# Patient Record
Sex: Female | Born: 1960 | Race: White | Hispanic: No | Marital: Married | Smoking: Never smoker
Health system: Southern US, Community
[De-identification: ages and names within clinical notes are randomized; demographics above are authoritative.]

## PROBLEM LIST (undated history)

## (undated) ENCOUNTER — Emergency Department (HOSPITAL_COMMUNITY): Admission: EM | Payer: Self-pay | Source: Home / Self Care

## (undated) DIAGNOSIS — K8689 Other specified diseases of pancreas: Secondary | ICD-10-CM

## (undated) DIAGNOSIS — I341 Nonrheumatic mitral (valve) prolapse: Secondary | ICD-10-CM

## (undated) DIAGNOSIS — Z8489 Family history of other specified conditions: Secondary | ICD-10-CM

## (undated) DIAGNOSIS — R51 Headache: Secondary | ICD-10-CM

## (undated) DIAGNOSIS — R519 Headache, unspecified: Secondary | ICD-10-CM

## (undated) DIAGNOSIS — E7800 Pure hypercholesterolemia, unspecified: Secondary | ICD-10-CM

## (undated) DIAGNOSIS — M3500 Sicca syndrome, unspecified: Secondary | ICD-10-CM

---

## 1998-09-17 ENCOUNTER — Other Ambulatory Visit: Admission: RE | Admit: 1998-09-17 | Discharge: 1998-09-17 | Payer: Self-pay | Admitting: *Deleted

## 2001-08-10 ENCOUNTER — Other Ambulatory Visit: Admission: RE | Admit: 2001-08-10 | Discharge: 2001-08-10 | Payer: Self-pay | Admitting: *Deleted

## 2002-07-25 ENCOUNTER — Encounter: Payer: Self-pay | Admitting: Internal Medicine

## 2002-07-25 ENCOUNTER — Ambulatory Visit (HOSPITAL_COMMUNITY): Admission: RE | Admit: 2002-07-25 | Discharge: 2002-07-25 | Payer: Self-pay | Admitting: Internal Medicine

## 2002-09-10 ENCOUNTER — Other Ambulatory Visit: Admission: RE | Admit: 2002-09-10 | Discharge: 2002-09-10 | Payer: Self-pay | Admitting: *Deleted

## 2003-04-05 ENCOUNTER — Encounter: Payer: Self-pay | Admitting: *Deleted

## 2003-04-05 ENCOUNTER — Ambulatory Visit (HOSPITAL_COMMUNITY): Admission: RE | Admit: 2003-04-05 | Discharge: 2003-04-05 | Payer: Self-pay | Admitting: *Deleted

## 2003-09-11 ENCOUNTER — Other Ambulatory Visit: Admission: RE | Admit: 2003-09-11 | Discharge: 2003-09-11 | Payer: Self-pay | Admitting: Obstetrics and Gynecology

## 2004-09-23 ENCOUNTER — Other Ambulatory Visit: Admission: RE | Admit: 2004-09-23 | Discharge: 2004-09-23 | Payer: Self-pay | Admitting: *Deleted

## 2006-01-24 ENCOUNTER — Other Ambulatory Visit: Admission: RE | Admit: 2006-01-24 | Discharge: 2006-01-24 | Payer: Self-pay | Admitting: Obstetrics & Gynecology

## 2006-08-26 ENCOUNTER — Other Ambulatory Visit: Admission: RE | Admit: 2006-08-26 | Discharge: 2006-08-26 | Payer: Self-pay | Admitting: Obstetrics & Gynecology

## 2007-01-26 ENCOUNTER — Other Ambulatory Visit: Admission: RE | Admit: 2007-01-26 | Discharge: 2007-01-26 | Payer: Self-pay | Admitting: Obstetrics & Gynecology

## 2008-02-19 ENCOUNTER — Other Ambulatory Visit: Admission: RE | Admit: 2008-02-19 | Discharge: 2008-02-19 | Payer: Self-pay | Admitting: Obstetrics & Gynecology

## 2011-06-22 HISTORY — PX: AUGMENTATION MAMMAPLASTY: SUR837

## 2012-11-21 ENCOUNTER — Ambulatory Visit (INDEPENDENT_AMBULATORY_CARE_PROVIDER_SITE_OTHER): Payer: Commercial Managed Care - PPO | Admitting: Psychology

## 2012-11-21 DIAGNOSIS — F4320 Adjustment disorder, unspecified: Secondary | ICD-10-CM

## 2013-03-16 ENCOUNTER — Encounter: Payer: Self-pay | Admitting: Obstetrics & Gynecology

## 2013-08-10 ENCOUNTER — Ambulatory Visit: Payer: Self-pay | Admitting: Obstetrics & Gynecology

## 2014-04-08 ENCOUNTER — Ambulatory Visit (INDEPENDENT_AMBULATORY_CARE_PROVIDER_SITE_OTHER): Payer: Commercial Managed Care - PPO | Admitting: Psychology

## 2014-04-08 DIAGNOSIS — F348 Other persistent mood [affective] disorders: Secondary | ICD-10-CM

## 2015-11-21 ENCOUNTER — Ambulatory Visit (INDEPENDENT_AMBULATORY_CARE_PROVIDER_SITE_OTHER): Payer: Commercial Managed Care - PPO | Admitting: Psychology

## 2015-11-21 DIAGNOSIS — F3481 Disruptive mood dysregulation disorder: Secondary | ICD-10-CM | POA: Diagnosis not present

## 2015-12-16 ENCOUNTER — Ambulatory Visit (INDEPENDENT_AMBULATORY_CARE_PROVIDER_SITE_OTHER): Payer: Commercial Managed Care - PPO | Admitting: Psychology

## 2015-12-16 DIAGNOSIS — F3481 Disruptive mood dysregulation disorder: Secondary | ICD-10-CM | POA: Diagnosis not present

## 2016-01-19 ENCOUNTER — Ambulatory Visit (INDEPENDENT_AMBULATORY_CARE_PROVIDER_SITE_OTHER): Payer: Commercial Managed Care - PPO | Admitting: Psychology

## 2016-01-19 DIAGNOSIS — F3481 Disruptive mood dysregulation disorder: Secondary | ICD-10-CM

## 2016-12-20 DIAGNOSIS — H93293 Other abnormal auditory perceptions, bilateral: Secondary | ICD-10-CM | POA: Diagnosis not present

## 2016-12-20 DIAGNOSIS — Z7289 Other problems related to lifestyle: Secondary | ICD-10-CM | POA: Diagnosis not present

## 2016-12-20 DIAGNOSIS — H90A22 Sensorineural hearing loss, unilateral, left ear, with restricted hearing on the contralateral side: Secondary | ICD-10-CM | POA: Diagnosis not present

## 2016-12-20 DIAGNOSIS — H6981 Other specified disorders of Eustachian tube, right ear: Secondary | ICD-10-CM | POA: Diagnosis not present

## 2016-12-20 DIAGNOSIS — H90A31 Mixed conductive and sensorineural hearing loss, unilateral, right ear with restricted hearing on the contralateral side: Secondary | ICD-10-CM | POA: Diagnosis not present

## 2016-12-30 ENCOUNTER — Ambulatory Visit (INDEPENDENT_AMBULATORY_CARE_PROVIDER_SITE_OTHER): Payer: Commercial Managed Care - PPO | Admitting: Psychology

## 2016-12-30 DIAGNOSIS — F3481 Disruptive mood dysregulation disorder: Secondary | ICD-10-CM | POA: Diagnosis not present

## 2017-01-03 ENCOUNTER — Ambulatory Visit: Payer: Commercial Managed Care - PPO | Admitting: Psychology

## 2017-01-06 DIAGNOSIS — H04123 Dry eye syndrome of bilateral lacrimal glands: Secondary | ICD-10-CM | POA: Diagnosis not present

## 2017-01-06 DIAGNOSIS — H5213 Myopia, bilateral: Secondary | ICD-10-CM | POA: Diagnosis not present

## 2017-01-20 DIAGNOSIS — Z0110 Encounter for examination of ears and hearing without abnormal findings: Secondary | ICD-10-CM | POA: Diagnosis not present

## 2017-01-20 DIAGNOSIS — H6981 Other specified disorders of Eustachian tube, right ear: Secondary | ICD-10-CM | POA: Diagnosis not present

## 2017-06-10 ENCOUNTER — Emergency Department (HOSPITAL_COMMUNITY)
Admission: EM | Admit: 2017-06-10 | Discharge: 2017-06-10 | Disposition: A | Discharge status: Home / Self Care | Payer: Commercial Managed Care - PPO | Attending: Emergency Medicine | Admitting: Emergency Medicine

## 2017-06-10 ENCOUNTER — Other Ambulatory Visit: Payer: Self-pay

## 2017-06-10 ENCOUNTER — Encounter (HOSPITAL_COMMUNITY): Payer: Self-pay

## 2017-06-10 ENCOUNTER — Emergency Department (HOSPITAL_COMMUNITY): Payer: Commercial Managed Care - PPO

## 2017-06-10 DIAGNOSIS — R072 Precordial pain: Secondary | ICD-10-CM | POA: Diagnosis not present

## 2017-06-10 DIAGNOSIS — R0789 Other chest pain: Secondary | ICD-10-CM | POA: Diagnosis not present

## 2017-06-10 DIAGNOSIS — R079 Chest pain, unspecified: Secondary | ICD-10-CM | POA: Diagnosis not present

## 2017-06-10 DIAGNOSIS — R9431 Abnormal electrocardiogram [ECG] [EKG]: Secondary | ICD-10-CM | POA: Diagnosis not present

## 2017-06-10 DIAGNOSIS — R061 Stridor: Secondary | ICD-10-CM | POA: Diagnosis not present

## 2017-06-10 LAB — CBC
HEMATOCRIT: 39.8 % (ref 36.0–46.0)
Hemoglobin: 13.5 g/dL (ref 12.0–15.0)
MCH: 32.5 pg (ref 26.0–34.0)
MCHC: 33.9 g/dL (ref 30.0–36.0)
MCV: 95.9 fL (ref 78.0–100.0)
PLATELETS: 262 10*3/uL (ref 150–400)
RBC: 4.15 MIL/uL (ref 3.87–5.11)
RDW: 11.8 % (ref 11.5–15.5)
WBC: 4.9 10*3/uL (ref 4.0–10.5)

## 2017-06-10 LAB — I-STAT TROPONIN, ED
TROPONIN I, POC: 0 ng/mL (ref 0.00–0.08)
TROPONIN I, POC: 0.01 ng/mL (ref 0.00–0.08)

## 2017-06-10 LAB — BASIC METABOLIC PANEL
Anion gap: 8 (ref 5–15)
BUN: 13 mg/dL (ref 6–20)
CALCIUM: 9.1 mg/dL (ref 8.9–10.3)
CO2: 29 mmol/L (ref 22–32)
Chloride: 102 mmol/L (ref 101–111)
Creatinine, Ser: 0.79 mg/dL (ref 0.44–1.00)
GFR calc Af Amer: >60 mL/min (ref >60)
GLUCOSE: 101 mg/dL — AB (ref 65–99)
POTASSIUM: 3.8 mmol/L (ref 3.5–5.1)
SODIUM: 139 mmol/L (ref 135–145)

## 2017-06-10 NOTE — Consult Note (Signed)
Cardiology Moonlighter Note  Called by Dr. Ashok Cordia in ED to evaluate Alyssa Harper, who is a 56 y.o. female presenting with chest discomfort. Symptoms started yesterday when driving home. Bilateral chest pressure without radiation or other associated symptoms. Remained constant for 2 to 3 hours then resolved spontaneously. Symptoms again started shortly after waking this morning. Again lasted a few hours and resolved spontaneously. Not associated with exertion. No radiation. Thinks that laying supine made it better. Taking deep breaths made symptoms worse. Symptoms gone by the time she arrived in ED and did not recur during ED course.   Patient is a never smoker. Drinks 1 to 2 glasses of wine several days per week. Works as an Forensic psychologist. Has been under an extraordinary amount of stress this year due to work issues. Has "high" HDL and "low" LDL per her report. Has psoriasis, no other medical issues. Both grandfathers had "heart troubles." One grandfather died from MI in his 78's. Other died in his 20's from pancreatic cancer. No history of early cardiac disease or stroke.   Physical exam on Alyssa Harper completely normal. Chest discomfort near left breast reproducible with palpation in intercostal space. ECG w/ diffuse, sub-mm, upsloping ST elevations and diffuse PR depressions. Nonspecific T wave flattening. Normal R wave progression. No Q waves. Troponin negative x 2, drawn 3 hours apart.   In summary, patient presenting with atypical chest pain symptoms and nonspecific ECG changes. I discussed a differential diagnosis with the patient, which included pericarditis, costochondritis, GERD. ECG is more suspicious for pericarditis than for coronary disease. Heart score in ED 2, corresponding to low risk for MACE. Patient prefers to go home and continue workup as outpatient. Agreed that this plan is reasonable. Patient will call to schedule outpatient appointment with cardiologist. Recommended that she have an exercise  stress test at some point in the near future, which she will discuss at her cardiologist appointment. All questions answered to patient and husband's satisfaction.  Alyssa Mowers, MD Cardiology Fellow, PGY-5

## 2017-06-10 NOTE — ED Notes (Signed)
Cards at bedside

## 2017-06-10 NOTE — ED Triage Notes (Signed)
[facility]- pt coming from PCP with complaint of chest tightness since 6:30 last night, stopped last night at 10:30. Was strongest at 10am. No cardiac hx, EKG WNL. 324mg  of aspirin, and 20g IV in LAC.  118/76, HR 60, SPO2 100%.

## 2017-06-10 NOTE — Discharge Instructions (Signed)
It was our pleasure to provide your ER care today - we hope that you feel better.  Take an enteric coated aspirin a day.   Follow up with cardiologist in the coming week - discuss possible stress testing.  Call their office Wednesday AM to arrange appointment.   Return to ER right away if worse, persistent or recurrent chest pain, trouble breathing, weak/fainting, other concern.

## 2017-06-10 NOTE — ED Provider Notes (Addendum)
[facility] DEPARTMENT Provider Note   CSN: [id number] Arrival date & time: 06/10/17  1703     History   Chief Complaint Chief Complaint  Patient presents with  . Chest Pain    HPI Alyssa Harper is a 56 y.o. female.  Patient c/o chest pain intermittently since last night. Occurred at rest, initially driving home last night.  After several minutes improved. Then recurred today, again at rest. No relation to exertion or activity. Was mild to moderate, achy, squeezy, with mild sob. No nv. No diaphoresis. No pleuritic pain. No cough or uri symptoms. No heartburn. No gerd hx. Mat and pat grandparents w hx cad. No recent exertional cp or unusual doe. No fevers. No leg pain or swelling.  No hx dvt or pe.  Non smoker.    The history is provided by the patient.  Chest Pain   Pertinent negatives include no abdominal pain, no fever, no headaches, no shortness of breath and no vomiting.    History reviewed. No pertinent past medical history.  There are no active problems to display for this patient.   History reviewed. No pertinent surgical history.  OB History    No data available       Home Medications    Prior to Admission medications   Not on File    Family History History reviewed. No pertinent family history.  Social History Social History   Tobacco Use  . Smoking status: Never Smoker  . Smokeless tobacco: Never Used  Substance Use Topics  . Alcohol use: Yes    Comment: glass of wine per night  . Drug use: Not on file     Allergies   Patient has no known allergies.   Review of Systems Review of Systems  Constitutional: Negative for fever.  HENT: Negative for sore throat.   Eyes: Negative for redness.  Respiratory: Negative for shortness of breath.   Cardiovascular: Positive for chest pain. Negative for leg swelling.  Gastrointestinal: Negative for abdominal pain, diarrhea and vomiting.  Genitourinary: Negative for flank  pain.  Musculoskeletal: Negative for neck pain.  Skin: Negative for rash.  Neurological: Negative for headaches.  Hematological: Does not bruise/bleed easily.  Psychiatric/Behavioral: Negative for confusion.     Physical Exam Updated Vital Signs BP 114/69 (BP Location: Right Arm)   Pulse (!) 53   Temp 98.8 F (37.1 C)   Resp 15   Ht 1.651 m (5\' 5" )   Wt 54.4 kg (120 lb)   SpO2 97%   BMI 19.97 kg/m   Physical Exam  Constitutional: She appears well-developed and well-nourished. No distress.  HENT:  Mouth/Throat: Oropharynx is clear and moist.  Eyes: Conjunctivae are normal. No scleral icterus.  Neck: Neck supple. No tracheal deviation present.  Cardiovascular: Normal rate, regular rhythm, normal heart sounds and intact distal pulses. Exam reveals no gallop and no friction rub.  No murmur heard. Pulmonary/Chest: Effort normal and breath sounds normal. No respiratory distress.  Abdominal: Soft. Normal appearance and bowel sounds are normal. She exhibits no distension. There is no tenderness. There is no guarding.  Musculoskeletal: She exhibits no edema or tenderness.  Neurological: She is alert.  Skin: Skin is warm and dry. No rash noted. She is not diaphoretic.  Psychiatric: She has a normal mood and affect.  Nursing note and vitals reviewed.    ED Treatments / Results  Labs (all labs ordered are listed, but only abnormal results are displayed) Results for orders placed  or performed during the hospital encounter of 63/87/56  Basic metabolic panel  Result Value Ref Range   Sodium 139 135 - 145 mmol/L   Potassium 3.8 3.5 - 5.1 mmol/L   Chloride 102 101 - 111 mmol/L   CO2 29 22 - 32 mmol/L   Glucose, Bld 101 (H) 65 - 99 mg/dL   BUN 13 6 - 20 mg/dL   Creatinine, Ser 0.79 0.44 - 1.00 mg/dL   Calcium 9.1 8.9 - 10.3 mg/dL   GFR calc non Af Amer >60 >60 mL/min   GFR calc Af Amer >60 >60 mL/min   Anion gap 8 5 - 15  CBC  Result Value Ref Range   WBC 4.9 4.0 - 10.5 K/uL     RBC 4.15 3.87 - 5.11 MIL/uL   Hemoglobin 13.5 12.0 - 15.0 g/dL   HCT 39.8 36.0 - 46.0 %   MCV 95.9 78.0 - 100.0 fL   MCH 32.5 26.0 - 34.0 pg   MCHC 33.9 30.0 - 36.0 g/dL   RDW 11.8 11.5 - 15.5 %   Platelets 262 150 - 400 K/uL  I-stat troponin, ED  Result Value Ref Range   Troponin i, poc 0.00 0.00 - 0.08 ng/mL   Comment 3          I-Stat Troponin, ED (not at [facility])  Result Value Ref Range   Troponin i, poc 0.01 0.00 - 0.08 ng/mL   Comment 3           Dg Chest 2 View  Result Date: 06/10/2017 CLINICAL DATA:  Chest tightness since last night. EXAM: CHEST  2 VIEW COMPARISON:  None. FINDINGS: The heart size and mediastinal contours are within normal limits. Both lungs are clear. The visualized skeletal structures are unremarkable. IMPRESSION: No active cardiopulmonary disease. Electronically Signed   By: Marin Olp M.D.   On: 06/10/2017 18:06    EKG  EKG Interpretation None       Radiology Dg Chest 2 View  Result Date: 06/10/2017 CLINICAL DATA:  Chest tightness since last night. EXAM: CHEST  2 VIEW COMPARISON:  None. FINDINGS: The heart size and mediastinal contours are within normal limits. Both lungs are clear. The visualized skeletal structures are unremarkable. IMPRESSION: No active cardiopulmonary disease. Electronically Signed   By: Marin Olp M.D.   On: 06/10/2017 18:06    Procedures Procedures (including critical care time)  Medications Ordered in ED Medications - No data to display   Initial Impression / Assessment and Plan / ED Course  I have reviewed the triage vital signs and the nursing notes.  Pertinent labs & imaging results that were available during my care of the patient were reviewed by me and considered in my medical decision making (see chart for details).  Iv ns. Ecg. Labs.   Reviewed nursing notes and prior charts for additional history.   Initial trop negative/normal.  Discussed plan of delta trop w pt.  Recheck, patient is symptom  free, no chest discomfort, no sob.   Repeat trop is normal.  Patient remains chest pain/discomfort free, no sob.   pts has some t inv, no old ecg to compare.  Remains symptom free.  I discussed with cardiologist on call, who reviewed pts ecg, and came to evaluate her in ED - he indicates feels that patient is appropriate for d/c to home with outpatient card f/u/outpt stress testing.   Given family hx cad (in older age), recent atyp chest pain, ecg w no old for  comparison - rec close outpt cardiology f/u, stress testing.   rec asa q day. Discussed w pt, return precautions, including go to ER if any recurrent or persistent chest pain, sob, etc.  Patient currently symptom free and appears stable for d/c.     Final Clinical Impressions(s) / ED Diagnoses   Final diagnoses:  None    ED Discharge Orders    None           Lajean Saver, MD 06/10/17 2201

## 2017-06-11 DIAGNOSIS — R0789 Other chest pain: Secondary | ICD-10-CM | POA: Diagnosis not present

## 2017-07-29 DIAGNOSIS — Y9301 Activity, walking, marching and hiking: Secondary | ICD-10-CM | POA: Diagnosis not present

## 2017-07-29 DIAGNOSIS — X58XXXA Exposure to other specified factors, initial encounter: Secondary | ICD-10-CM | POA: Diagnosis not present

## 2017-07-29 DIAGNOSIS — S52134A Nondisplaced fracture of neck of right radius, initial encounter for closed fracture: Secondary | ICD-10-CM | POA: Diagnosis not present

## 2017-07-29 DIAGNOSIS — W010XXA Fall on same level from slipping, tripping and stumbling without subsequent striking against object, initial encounter: Secondary | ICD-10-CM | POA: Diagnosis not present

## 2017-07-29 DIAGNOSIS — S52131A Displaced fracture of neck of right radius, initial encounter for closed fracture: Secondary | ICD-10-CM | POA: Diagnosis not present

## 2017-08-01 DIAGNOSIS — M25531 Pain in right wrist: Secondary | ICD-10-CM | POA: Diagnosis not present

## 2017-08-01 DIAGNOSIS — M25521 Pain in right elbow: Secondary | ICD-10-CM | POA: Diagnosis not present

## 2017-08-15 DIAGNOSIS — M25521 Pain in right elbow: Secondary | ICD-10-CM | POA: Diagnosis not present

## 2017-08-17 DIAGNOSIS — N951 Menopausal and female climacteric states: Secondary | ICD-10-CM | POA: Diagnosis not present

## 2017-08-17 DIAGNOSIS — Z131 Encounter for screening for diabetes mellitus: Secondary | ICD-10-CM | POA: Diagnosis not present

## 2017-08-17 DIAGNOSIS — R682 Dry mouth, unspecified: Secondary | ICD-10-CM | POA: Diagnosis not present

## 2017-08-17 DIAGNOSIS — M859 Disorder of bone density and structure, unspecified: Secondary | ICD-10-CM | POA: Diagnosis not present

## 2017-08-17 DIAGNOSIS — Z1329 Encounter for screening for other suspected endocrine disorder: Secondary | ICD-10-CM | POA: Diagnosis not present

## 2017-08-17 DIAGNOSIS — Z0000 Encounter for general adult medical examination without abnormal findings: Secondary | ICD-10-CM | POA: Diagnosis not present

## 2017-08-17 DIAGNOSIS — Z130 Encounter for screening for diseases of the blood and blood-forming organs and certain disorders involving the immune mechanism: Secondary | ICD-10-CM | POA: Diagnosis not present

## 2017-08-17 DIAGNOSIS — Z01419 Encounter for gynecological examination (general) (routine) without abnormal findings: Secondary | ICD-10-CM | POA: Diagnosis not present

## 2017-08-17 DIAGNOSIS — Z13220 Encounter for screening for lipoid disorders: Secondary | ICD-10-CM | POA: Diagnosis not present

## 2017-09-01 DIAGNOSIS — M25531 Pain in right wrist: Secondary | ICD-10-CM | POA: Diagnosis not present

## 2017-09-01 DIAGNOSIS — M25521 Pain in right elbow: Secondary | ICD-10-CM | POA: Diagnosis not present

## 2017-09-20 DIAGNOSIS — S63501D Unspecified sprain of right wrist, subsequent encounter: Secondary | ICD-10-CM | POA: Diagnosis not present

## 2017-09-20 DIAGNOSIS — S52134D Nondisplaced fracture of neck of right radius, subsequent encounter for closed fracture with routine healing: Secondary | ICD-10-CM | POA: Diagnosis not present

## 2017-10-04 DIAGNOSIS — S52134D Nondisplaced fracture of neck of right radius, subsequent encounter for closed fracture with routine healing: Secondary | ICD-10-CM | POA: Diagnosis not present

## 2017-10-04 DIAGNOSIS — S63501D Unspecified sprain of right wrist, subsequent encounter: Secondary | ICD-10-CM | POA: Diagnosis not present

## 2017-12-21 DIAGNOSIS — R21 Rash and other nonspecific skin eruption: Secondary | ICD-10-CM | POA: Diagnosis not present

## 2017-12-21 DIAGNOSIS — R682 Dry mouth, unspecified: Secondary | ICD-10-CM | POA: Diagnosis not present

## 2017-12-21 DIAGNOSIS — Z9109 Other allergy status, other than to drugs and biological substances: Secondary | ICD-10-CM | POA: Diagnosis not present

## 2018-01-12 DIAGNOSIS — H5213 Myopia, bilateral: Secondary | ICD-10-CM | POA: Diagnosis not present

## 2018-01-12 DIAGNOSIS — H04123 Dry eye syndrome of bilateral lacrimal glands: Secondary | ICD-10-CM | POA: Diagnosis not present

## 2018-01-12 DIAGNOSIS — H524 Presbyopia: Secondary | ICD-10-CM | POA: Diagnosis not present

## 2018-02-14 DIAGNOSIS — H6981 Other specified disorders of Eustachian tube, right ear: Secondary | ICD-10-CM | POA: Diagnosis not present

## 2018-03-15 DIAGNOSIS — M3500 Sicca syndrome, unspecified: Secondary | ICD-10-CM | POA: Diagnosis not present

## 2018-03-15 DIAGNOSIS — R5383 Other fatigue: Secondary | ICD-10-CM | POA: Diagnosis not present

## 2018-03-15 DIAGNOSIS — L409 Psoriasis, unspecified: Secondary | ICD-10-CM | POA: Diagnosis not present

## 2018-06-26 ENCOUNTER — Ambulatory Visit: Payer: Self-pay | Admitting: Sports Medicine

## 2018-06-27 ENCOUNTER — Other Ambulatory Visit: Payer: Self-pay | Admitting: Sports Medicine

## 2018-06-27 ENCOUNTER — Encounter: Payer: Self-pay | Admitting: Sports Medicine

## 2018-06-27 ENCOUNTER — Ambulatory Visit
Admission: RE | Admit: 2018-06-27 | Discharge: 2018-06-27 | Disposition: A | Discharge status: Home / Self Care | Payer: 59 | Source: Ambulatory Visit | Attending: Sports Medicine | Admitting: Sports Medicine

## 2018-06-27 ENCOUNTER — Ambulatory Visit (INDEPENDENT_AMBULATORY_CARE_PROVIDER_SITE_OTHER): Payer: 59 | Admitting: Sports Medicine

## 2018-06-27 ENCOUNTER — Ambulatory Visit: Payer: Self-pay | Admitting: Family Medicine

## 2018-06-27 VITALS — BP 109/63 | Ht 65.0 in | Wt 119.0 lb

## 2018-06-27 DIAGNOSIS — M542 Cervicalgia: Secondary | ICD-10-CM

## 2018-06-27 DIAGNOSIS — M85859 Other specified disorders of bone density and structure, unspecified thigh: Secondary | ICD-10-CM

## 2018-06-27 DIAGNOSIS — M8580 Other specified disorders of bone density and structure, unspecified site: Secondary | ICD-10-CM | POA: Insufficient documentation

## 2018-06-27 NOTE — Progress Notes (Signed)
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

## 2018-06-27 NOTE — Assessment & Plan Note (Signed)
Isometric exercise protocol Posture change Trapezius exercises  Check XR for DDD  Try conservative care

## 2018-06-27 NOTE — Assessment & Plan Note (Signed)
Needs to use Ca and Vit D  Preventive hop exercise

## 2018-06-27 NOTE — Patient Instructions (Signed)
For osteopenia  Do my 30 sec hop x 3 daily as a prevention  Neck isometric exercises - 4 directions / count of 10/ 5 repeats  Shake outs - swinging your arms when you get trapezius spasm  Good posture on computer  If you get a flare try taking a gabapentin to see if it blocks the symtpoms  I'll call you with XR result

## 2018-07-03 ENCOUNTER — Encounter (HOSPITAL_COMMUNITY): Payer: Self-pay

## 2018-07-03 ENCOUNTER — Emergency Department (HOSPITAL_COMMUNITY): Payer: 59

## 2018-07-03 ENCOUNTER — Inpatient Hospital Stay (HOSPITAL_COMMUNITY): Payer: 59

## 2018-07-03 ENCOUNTER — Other Ambulatory Visit: Payer: Self-pay

## 2018-07-03 ENCOUNTER — Inpatient Hospital Stay (HOSPITAL_COMMUNITY)
Admission: EM | Admit: 2018-07-03 | Discharge: 2018-07-05 | DRG: 435 | Disposition: A | Discharge status: Home / Self Care | Payer: 59 | Attending: Internal Medicine | Admitting: Internal Medicine

## 2018-07-03 DIAGNOSIS — E7800 Pure hypercholesterolemia, unspecified: Secondary | ICD-10-CM | POA: Diagnosis present

## 2018-07-03 DIAGNOSIS — K8500 Idiopathic acute pancreatitis without necrosis or infection: Secondary | ICD-10-CM | POA: Diagnosis present

## 2018-07-03 DIAGNOSIS — R112 Nausea with vomiting, unspecified: Secondary | ICD-10-CM | POA: Diagnosis present

## 2018-07-03 DIAGNOSIS — M3500 Sicca syndrome, unspecified: Secondary | ICD-10-CM | POA: Diagnosis present

## 2018-07-03 DIAGNOSIS — I341 Nonrheumatic mitral (valve) prolapse: Secondary | ICD-10-CM | POA: Diagnosis present

## 2018-07-03 DIAGNOSIS — D649 Anemia, unspecified: Secondary | ICD-10-CM | POA: Diagnosis present

## 2018-07-03 DIAGNOSIS — R935 Abnormal findings on diagnostic imaging of other abdominal regions, including retroperitoneum: Secondary | ICD-10-CM | POA: Diagnosis not present

## 2018-07-03 DIAGNOSIS — K8689 Other specified diseases of pancreas: Secondary | ICD-10-CM | POA: Diagnosis present

## 2018-07-03 DIAGNOSIS — K7689 Other specified diseases of liver: Secondary | ICD-10-CM | POA: Diagnosis present

## 2018-07-03 DIAGNOSIS — C251 Malignant neoplasm of body of pancreas: Secondary | ICD-10-CM | POA: Diagnosis present

## 2018-07-03 DIAGNOSIS — Z8379 Family history of other diseases of the digestive system: Secondary | ICD-10-CM | POA: Diagnosis not present

## 2018-07-03 DIAGNOSIS — R110 Nausea: Secondary | ICD-10-CM | POA: Diagnosis not present

## 2018-07-03 DIAGNOSIS — K859 Acute pancreatitis without necrosis or infection, unspecified: Secondary | ICD-10-CM | POA: Diagnosis present

## 2018-07-03 DIAGNOSIS — K869 Disease of pancreas, unspecified: Secondary | ICD-10-CM | POA: Diagnosis not present

## 2018-07-03 HISTORY — DX: Other specified diseases of pancreas: K86.89

## 2018-07-03 HISTORY — DX: Family history of other specified conditions: Z84.89

## 2018-07-03 HISTORY — DX: Headache: R51

## 2018-07-03 HISTORY — DX: Nonrheumatic mitral (valve) prolapse: I34.1

## 2018-07-03 HISTORY — DX: Pure hypercholesterolemia, unspecified: E78.00

## 2018-07-03 HISTORY — DX: Headache, unspecified: R51.9

## 2018-07-03 HISTORY — DX: Sjogren syndrome, unspecified: M35.00

## 2018-07-03 LAB — CBC WITH DIFFERENTIAL/PLATELET
Abs Immature Granulocytes: 0.03 10*3/uL (ref 0.00–0.07)
Basophils Absolute: 0 10*3/uL (ref 0.0–0.1)
Basophils Relative: 0 %
Eosinophils Absolute: 0 10*3/uL (ref 0.0–0.5)
Eosinophils Relative: 0 %
HCT: 41.4 % (ref 36.0–46.0)
Hemoglobin: 14.1 g/dL (ref 12.0–15.0)
Immature Granulocytes: 0 %
Lymphocytes Relative: 8 %
Lymphs Abs: 0.7 10*3/uL (ref 0.7–4.0)
MCH: 32.6 pg (ref 26.0–34.0)
MCHC: 34.1 g/dL (ref 30.0–36.0)
MCV: 95.8 fL (ref 80.0–100.0)
Monocytes Absolute: 0.3 10*3/uL (ref 0.1–1.0)
Monocytes Relative: 3 %
Neutro Abs: 7.4 10*3/uL (ref 1.7–7.7)
Neutrophils Relative %: 89 %
Platelets: 270 10*3/uL (ref 150–400)
RBC: 4.32 MIL/uL (ref 3.87–5.11)
RDW: 11.4 % — ABNORMAL LOW (ref 11.5–15.5)
WBC: 8.4 10*3/uL (ref 4.0–10.5)
nRBC: 0 % (ref 0.0–0.2)

## 2018-07-03 LAB — COMPREHENSIVE METABOLIC PANEL
ALK PHOS: 59 U/L (ref 38–126)
ALT: 17 U/L (ref 0–44)
AST: 23 U/L (ref 15–41)
Albumin: 4.5 g/dL (ref 3.5–5.0)
Anion gap: 12 (ref 5–15)
BILIRUBIN TOTAL: 1.1 mg/dL (ref 0.3–1.2)
BUN: 10 mg/dL (ref 6–20)
CALCIUM: 9.4 mg/dL (ref 8.9–10.3)
CO2: 23 mmol/L (ref 22–32)
Chloride: 103 mmol/L (ref 98–111)
Creatinine, Ser: 0.82 mg/dL (ref 0.44–1.00)
GFR calc Af Amer: >60 mL/min (ref >60)
GFR calc non Af Amer: >60 mL/min (ref >60)
GLUCOSE: 117 mg/dL — AB (ref 70–99)
Potassium: 3.7 mmol/L (ref 3.5–5.1)
SODIUM: 138 mmol/L (ref 135–145)
TOTAL PROTEIN: 7.1 g/dL (ref 6.5–8.1)

## 2018-07-03 LAB — I-STAT TROPONIN, ED: Troponin i, poc: 0.01 ng/mL (ref 0.00–0.08)

## 2018-07-03 LAB — BRAIN NATRIURETIC PEPTIDE: B NATRIURETIC PEPTIDE 5: 28.9 pg/mL (ref 0.0–100.0)

## 2018-07-03 LAB — LIPASE, BLOOD: Lipase: 4125 U/L — ABNORMAL HIGH (ref 11–51)

## 2018-07-03 MED ORDER — LORAZEPAM 2 MG/ML IJ SOLN: 0.5000 mg | Freq: Once | INTRAMUSCULAR | Status: DC

## 2018-07-03 MED ORDER — ENOXAPARIN SODIUM 40 MG/0.4ML ~~LOC~~ SOLN
40.0000 mg | SUBCUTANEOUS | Status: DC
  Administered 2018-07-03 – 2018-07-04 (×2): 40 mg via SUBCUTANEOUS
  Filled 2018-07-03 (×2): qty 0.4

## 2018-07-03 MED ORDER — SODIUM CHLORIDE 0.9 % IV BOLUS
1000.0000 mL | Freq: Once | INTRAVENOUS | Status: AC
  Administered 2018-07-03: 1000 mL via INTRAVENOUS

## 2018-07-03 MED ORDER — MORPHINE SULFATE (PF) 4 MG/ML IV SOLN
4.0000 mg | INTRAVENOUS | Status: DC | PRN
  Administered 2018-07-03: 4 mg via INTRAVENOUS
  Administered 2018-07-05: 2 mg via INTRAVENOUS
  Filled 2018-07-03 (×2): qty 1

## 2018-07-03 MED ORDER — MORPHINE SULFATE (PF) 4 MG/ML IV SOLN
4.0000 mg | Freq: Once | INTRAVENOUS | Status: AC
  Administered 2018-07-03: 4 mg via INTRAVENOUS
  Filled 2018-07-03: qty 1

## 2018-07-03 MED ORDER — GADOBUTROL 1 MMOL/ML IV SOLN
5.0000 mL | Freq: Once | INTRAVENOUS | Status: AC | PRN
  Administered 2018-07-03: 5 mL via INTRAVENOUS

## 2018-07-03 MED ORDER — PROMETHAZINE HCL 25 MG/ML IJ SOLN
12.5000 mg | Freq: Four times a day (QID) | INTRAMUSCULAR | Status: DC | PRN
  Administered 2018-07-03 – 2018-07-05 (×2): 12.5 mg via INTRAVENOUS
  Filled 2018-07-03 (×2): qty 1

## 2018-07-03 MED ORDER — ONDANSETRON HCL 4 MG PO TABS: 4.0000 mg | ORAL_TABLET | Freq: Four times a day (QID) | ORAL | Status: DC | PRN

## 2018-07-03 MED ORDER — ACETAMINOPHEN 325 MG PO TABS
650.0000 mg | ORAL_TABLET | Freq: Four times a day (QID) | ORAL | Status: DC | PRN
  Administered 2018-07-04 – 2018-07-05 (×5): 650 mg via ORAL
  Filled 2018-07-03 (×5): qty 2

## 2018-07-03 MED ORDER — FAMOTIDINE IN NACL 20-0.9 MG/50ML-% IV SOLN
20.0000 mg | Freq: Two times a day (BID) | INTRAVENOUS | Status: DC
  Administered 2018-07-03 – 2018-07-04 (×4): 20 mg via INTRAVENOUS
  Filled 2018-07-03 (×5): qty 50

## 2018-07-03 MED ORDER — HYDROMORPHONE HCL 1 MG/ML IJ SOLN
0.5000 mg | INTRAMUSCULAR | Status: DC | PRN
  Administered 2018-07-03: 0.5 mg via INTRAVENOUS
  Filled 2018-07-03: qty 1

## 2018-07-03 MED ORDER — ONDANSETRON HCL 4 MG/2ML IJ SOLN
4.0000 mg | Freq: Four times a day (QID) | INTRAMUSCULAR | Status: DC | PRN
  Administered 2018-07-03 – 2018-07-05 (×2): 4 mg via INTRAVENOUS
  Filled 2018-07-03 (×2): qty 2

## 2018-07-03 MED ORDER — IOPAMIDOL (ISOVUE-370) INJECTION 76%
INTRAVENOUS | Status: AC
  Administered 2018-07-03: 11:00:00
  Filled 2018-07-03: qty 100

## 2018-07-03 MED ORDER — ALUM & MAG HYDROXIDE-SIMETH 200-200-20 MG/5ML PO SUSP
30.0000 mL | Freq: Once | ORAL | Status: DC
  Filled 2018-07-03 (×2): qty 30

## 2018-07-03 MED ORDER — ACETAMINOPHEN 650 MG RE SUPP: 650.0000 mg | Freq: Four times a day (QID) | RECTAL | Status: DC | PRN

## 2018-07-03 MED ORDER — SODIUM CHLORIDE 0.9 % IV SOLN: Freq: Once | INTRAVENOUS | Status: DC

## 2018-07-03 MED ORDER — SODIUM CHLORIDE 0.9 % IV SOLN
INTRAVENOUS | Status: DC
  Administered 2018-07-03 – 2018-07-04 (×4): via INTRAVENOUS

## 2018-07-03 MED ORDER — ONDANSETRON HCL 4 MG/2ML IJ SOLN
4.0000 mg | Freq: Once | INTRAMUSCULAR | Status: AC
  Administered 2018-07-03: 4 mg via INTRAVENOUS
  Filled 2018-07-03: qty 2

## 2018-07-03 NOTE — Progress Notes (Signed)
Referring Provider:  EDP Primary Care Physician:  [facility] @ [facility] Primary Gastroenterologist:  Dr. Watt Climes  Reason for Consultation: Abnormal CT scan, pancreatic mass  HPI: Alyssa Harper is a 58 y.o. female presented to the hospital for further evaluation of epigastric abdominal pain, nausea, vomiting and diarrhea.  Intermittent symptoms for 2 days.  Epigastric pain radiating to back.  Upon initial evaluation in the emergency room, she was found to have a normal CBC and CMP but elevated lipase of around 4000.  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  It also showed indeterminate 1.2 cm hyperenhancing lesion on the left lobe of the liver.  Abdominal pain is improving with pain medication.  She denies any weight loss.  Denies change in appetite.  Diarrhea is improving.  Denies dysphagia odynophagia.  Complaining of dark stools.( ??  Pepto-Bismol)  grandfather was diagnosed with pancreatic cancer in his 14s.  Past Medical History:  Diagnosis Date  . Sjogren's disease ([place])     History reviewed. No pertinent surgical history.  Prior to Admission medications   Medication Sig Start Date End Date Taking? Authorizing Provider  bismuth subsalicylate (PEPTO BISMOL) 262 MG chewable tablet Chew 524 mg by mouth as needed for indigestion or diarrhea or loose stools.   Yes [provider]  ibuprofen (ADVIL,MOTRIN) 200 MG tablet Take 200 mg by mouth every 6 (six) hours as needed for moderate pain.   Yes [provider]  OVER THE COUNTER MEDICATION Take 1 tablet by mouth daily. PB8 Probiotic   Yes [provider]  OVER THE COUNTER MEDICATION Take 1 tablet by mouth daily. Thermo green tea   Yes [provider]    Scheduled Meds: . alum & mag hydroxide-simeth  30 mL Oral Once  . enoxaparin (LOVENOX) injection  40 mg Subcutaneous Q24H   Continuous  Infusions: . sodium chloride 150 mL/hr at 07/03/18 1405  . famotidine (PEPCID) IV 20 mg (07/03/18 1458)   PRN Meds:.acetaminophen **OR** acetaminophen, HYDROmorphone (DILAUDID) injection, ondansetron **OR** ondansetron (ZOFRAN) IV  Allergies as of 07/03/2018  . (No Known Allergies)    History reviewed. No pertinent family history.  Social History   Socioeconomic History  . Marital status: Married    Spouse name: Not on file  . Number of children: Not on file  . Years of education: Not on file  . Highest education level: Not on file  Occupational History  . Not on file  Social Needs  . Financial resource strain: Not on file  . Food insecurity:    Worry: Not on file    Inability: Not on file  . Transportation needs:    Medical: Not on file    Non-medical: Not on file  Tobacco Use  . Smoking status: Never Smoker  Substance and Sexual Activity  . Alcohol use: Yes    Alcohol/week: 7.0 standard drinks    Types: 7 Standard drinks or equivalent per week  . Drug use: Never  . Sexual activity: Not on file  Lifestyle  . Physical activity:    Days per week: Not on file    Minutes per session: Not on file  . Stress: Not on file  Relationships  . Social connections:    Talks on phone: Not on file    Gets together: Not on file    Attends religious service: Not on file    Active member of club or  organization: Not on file    Attends meetings of clubs or organizations: Not on file    Relationship status: Not on file  . Intimate partner violence:    Fear of current or ex partner: Not on file    Emotionally abused: Not on file    Physically abused: Not on file    Forced sexual activity: Not on file  Other Topics Concern  . Not on file  Social History Narrative  . Not on file    Review of Systems: All negative except as stated above in HPI.  Physical Exam: Vital signs: Vitals:   07/03/18 1200 07/03/18 1356  BP: 103/67 107/68  Pulse: 69 63  Resp: 13 16  Temp:  98.1  F (36.7 C)  SpO2: 97% 100%   Last BM Date: 07/03/18(5am) Physical Exam  Constitutional: She is oriented to person, place, and time. She appears well-developed and well-nourished.  HENT:  Head: Normocephalic and atraumatic.  Eyes: EOM are normal. No scleral icterus.  Neck: Neck supple.  Cardiovascular: Normal rate, regular rhythm and normal heart sounds.  Pulmonary/Chest: Effort normal and breath sounds normal. No respiratory distress.  Abdominal: Soft. Bowel sounds are normal. She exhibits no distension. There is abdominal tenderness. There is no rebound and no guarding.  Musculoskeletal: Normal range of motion.        General: No edema.  Neurological: She is alert and oriented to person, place, and time.  Skin: Skin is warm. No erythema.  Psychiatric: She has a normal mood and affect. Judgment and thought content normal.  Vitals reviewed.   GI:  Lab Results: Recent Labs    07/03/18 0936  WBC 8.4  HGB 14.1  HCT 41.4  PLT 270   BMET Recent Labs    07/03/18 0936  NA 138  K 3.7  CL 103  CO2 23  GLUCOSE 117*  BUN 10  CREATININE 0.82  CALCIUM 9.4   LFT Recent Labs    07/03/18 0936  PROT 7.1  ALBUMIN 4.5  AST 23  ALT 17  ALKPHOS 59  BILITOT 1.1   PT/INR No results for input(s): LABPROT, INR in the last 72 hours.   Studies/Results: Dg Chest Port 1 View  Result Date: 07/03/2018 CLINICAL DATA:  Chest pain.  Nausea.  Chills. EXAM: PORTABLE CHEST 1 VIEW COMPARISON:  None. FINDINGS: Upper normal heart size. Vascular congestion. No consolidation or pulmonary edema. No pneumothorax or pleural effusion. IMPRESSION: Vascular congestion without pulmonary edema. Electronically Signed   By: Marybelle Killings M.D.   On: 07/03/2018 10:10   Ct Angio Chest/abd/pel For Dissection W And/or Wo Contrast  Result Date: 07/03/2018 CLINICAL DATA:  Acute chest and abdominal pain this morning. Evaluate for dissection. EXAM: CT ANGIOGRAPHY CHEST, ABDOMEN AND PELVIS TECHNIQUE:  Multidetector CT imaging through the chest, abdomen and pelvis was performed using the standard protocol during bolus administration of intravenous contrast. Multiplanar reconstructed images and MIPs were obtained and reviewed to evaluate the vascular anatomy. CONTRAST:  100 cc Isovue-300 COMPARISON:  None. FINDINGS: CTA CHEST FINDINGS Vascular Findings: No evidence of thoracic aortic aneurysm or dissection on this nongated examination. Review of the precontrast images are negative for the presence of an intramural hematoma. No definitive perivascular stranding. Conventional configuration of the aortic arch. The branch vessels of the aortic arch appear widely patent throughout their imaged courses. Normal heart size.  No pericardial effusion. Although this examination was not tailored for the evaluation the pulmonary arteries, there are no discrete filling defects within  the central pulmonary arterial tree to suggest central pulmonary embolism. Normal caliber of the main pulmonary artery. ------------------------------------------------------------- Thoracic aortic measurements: Sinotubular junction 27 mm as measured in greatest oblique coronal dimension. Proximal ascending aorta 30 mm as measured in greatest oblique axial dimension at the level of the main pulmonary artery. Aortic arch aorta 25 mm as measured in greatest oblique sagittal dimension. Proximal descending thoracic aorta 23 mm as measured in greatest oblique axial dimension at the level of the main pulmonary artery. Distal descending thoracic aorta 22 mm as measured in greatest oblique axial dimension at the level of the diaphragmatic hiatus. Review of the MIP images confirms the above findings. ------------------------------------------------------------- Non-Vascular Findings: Mediastinum/Lymph Nodes: No bulky mediastinal, hilar or axillary lymphadenopathy. Lungs/Pleura: Minimal dependent subpleural ground-glass atelectasis. No discrete focal airspace  opacities. No pleural effusion or pneumothorax. The central pulmonary airways appear widely patent. No discrete pulmonary nodules. Musculoskeletal: No acute or aggressive osseous abnormalities. Post bilateral breast augmentation. Punctate (sub 3 mm) hypoattenuating right-sided thyroid nodules/cysts. _________________________________________________________ _________________________________________________________ CTA ABDOMEN AND PELVIS FINDINGS VASCULAR Aorta: Normal caliber of the abdominal aorta. There is no significant atherosclerotic plaque within the abdominal aorta. No abdominal aortic dissection or periaortic stranding. Celiac: There is mild tortuosity involving the origin of the celiac artery, not resulting in hemodynamically significant stenosis. Conventional branching pattern. SMA: Widely patent without a hemodynamically significant narrowing. Conventional branching pattern. Renals: Solitary bilaterally; the bilateral renal arteries are widely patent without hemodynamically significant narrowing. No vessel irregularity to suggest FMD. IMA: Widely patent without hemodynamically significant narrowing. Inflow: The bilateral common, external and internal iliac arteries are of normal caliber and widely patent without hemodynamically significant narrowing. Veins: The IVC and pelvic venous system appear widely patent on this arterial phase examination. Review of the MIP images confirms the above findings. _________________________________________________________ NON-VASCULAR Evaluation the abdominal organs is limited to the arterial phase of enhancement. Hepatobiliary: Normal hepatic contour. There is an approximately 1.3 cm hypoattenuating (15 Hounsfield unit) cyst within the dome of the left lobe of the liver. There is an approximately 1.2 x 0.9 cm hyperenhancing lesion within the subcapsular aspect of the left lobe of the liver (image 294, series 8; coronal image 43, series 11, incompletely evaluated on the  present examination. Normal appearance of the gallbladder given degree distention. No radiopaque gallstones. No intra or extrahepatic biliary duct dilatation. No ascites. Pancreas: Pancreatic duct appears dilated at the level of the proximal body measuring approximately 1.3 x 1.2 cm (axial image 108, series 8; coronal image 51, series 11) with associated dilatation of the downstream pancreatic duct measuring at least 0.5 cm (image 113, series 8). There is a potential hypoattenuating mass involving the proximal body of the pancreas measuring approximately 6.0 x 2.5 x 3.4 cm (image 113, series 8; coronal image 40, series 11), suboptimally evaluated secondary to arterial phase of enhancement. No definitive downstream pancreatic atrophy. No peripancreatic stranding. The adjacent vasculature appears patent. Spleen: Normal arterial phase appearance of the spleen. Adrenals/Urinary Tract: There is symmetric enhancement the bilateral kidneys. No definite renal stones this postcontrast examination. No discrete renal lesions. No urine obstruction or perinephric stranding. Normal appearance the bilateral adrenal glands. Normal appearance of the urinary bladder given degree distention. Stomach/Bowel: Nonobstructive bowel gas pattern. The bowel is normal in course and caliber without discrete area of wall thickening. Normal appearance of the terminal ileum. The appendix is not visualized, however there is no pericecal inflammatory change. No pneumoperitoneum, pneumatosis or portal venous gas. Lymphatic: No bulky retroperitoneal, mesenteric, pelvic or  inguinal lymphadenopathy. Reproductive: Normal appearance of the pelvic organs. There is a small amount of presumably physiologic free fluid in the pelvic cul-de-sac. No discrete adnexal lesion. Other: Regional soft tissues appear normal. Musculoskeletal: No acute or aggressive osseous abnormalities. Review of the MIP images confirms the above findings. IMPRESSION: Chest CTA  impression: 1. No acute cardiopulmonary disease. Specifically, no evidence of thoracic aortic aneurysm or dissection. No evidence of central pulmonary embolism. Abdomen pelvis CTA impression: 1. No acute findings within the abdomen or pelvis. Specifically, no evidence of abdominal aortic aneurysm, dissection or mesenteric ischemia. 2. Pancreatic ductal dilatation with questioned approximately 6.0 cm hypoattenuating mass involving the proximal body of pancreas, incompletely evaluated on this arterial phase examination. Further evaluation with MRCP is advised. 3. Indeterminate approximately 1.2 cm hyperenhancing lesion within the subcapsular aspect of the left lobe of the liver, potentially representative of a perfusion anomaly or flash filling hemangioma, though incompletely characterized on the present examination. This lesion may also be further evaluated at the time of the recommended MRCP. Critical Value/emergent results were called by telephone at the time of interpretation on 07/03/2018 at 11:54 am to Dr. Dene Gentry , who verbally acknowledged these results. Electronically Signed   By: Sandi Mariscal M.D.   On: 07/03/2018 11:57    Impression/Plan: - Abnormal CT scan showing pancreatic ductal dilatation with a 6 cm pancreatic body mass. -Epigastric abdominal pain.  Recommendations ------------------------- -MRI MRCP for further evaluation. - check CA19-9 -Detailed discussion with the patient and patient's family regarding possibility of pancreatic cancer.  Possible need for EUS also discussed. -Okay to have diet after MRI. -GI will follow    LOS: 0 days   Otis Brace  MD, FACP 07/03/2018, 3:33 PM  Contact #  [phone]

## 2018-07-03 NOTE — ED Provider Notes (Addendum)
[facility] EMERGENCY DEPARTMENT Provider Note   CSN: [id number] Arrival date & time: 07/03/18  7341     History   Chief Complaint No chief complaint on file.   HPI Saje Gallop is a 58 y.o. female.  58 year old female with prior medical history as detailed below presents for evaluation of nausea, vomiting, diarrhea, and chest pain.  Patient reports acute onset of symptoms around 230 this morning.  She reports multiple episodes of emesis.  She denies bloody emesis or blood in her bowel movements.  She does report anterior chest pain associated with the nausea and vomiting episodes.  She denies fever.  She is concerned that she may have food poisoning.  She denies back pain.  She denies current abdominal pain.  She reports that her diarrhea is loose and watery.  She has not taken anything at home for her symptoms.  The history is provided by medical records and the patient.  Emesis  Severity:  Moderate Duration:  6 hours Timing:  Constant Quality:  Stomach contents Progression:  Unchanged Chronicity:  New Recent urination:  Normal Relieved by:  Nothing Worsened by:  Nothing Ineffective treatments:  None tried Associated symptoms: no abdominal pain and no fever     No past medical history on file.  There are no active problems to display for this patient.     OB History   No obstetric history on file.      Home Medications    Prior to Admission medications   Not on File    Family History No family history on file.  Social History Social History   Tobacco Use  . Smoking status: Not on file  Substance Use Topics  . Alcohol use: Not on file  . Drug use: Not on file     Allergies   Patient has no allergy information on record.   Review of Systems Review of Systems  Constitutional: Negative for fever.  Gastrointestinal: Positive for vomiting. Negative for abdominal pain.  All other systems reviewed and are  negative.    Physical Exam Updated Vital Signs There were no vitals taken for this visit.  Physical Exam Vitals signs and nursing note reviewed.  Constitutional:      General: She is not in acute distress.    Appearance: Normal appearance. She is well-developed.  HENT:     Head: Normocephalic and atraumatic.  Eyes:     Conjunctiva/sclera: Conjunctivae normal.     Pupils: Pupils are equal, round, and reactive to light.  Neck:     Musculoskeletal: Normal range of motion and neck supple.  Cardiovascular:     Rate and Rhythm: Normal rate and regular rhythm.     Heart sounds: Normal heart sounds.  Pulmonary:     Effort: Pulmonary effort is normal. No respiratory distress.     Breath sounds: Normal breath sounds.  Abdominal:     General: There is no distension.     Palpations: Abdomen is soft.     Tenderness: There is abdominal tenderness.     Comments: Tender in the epigastrium  Musculoskeletal: Normal range of motion.        General: No deformity.  Skin:    General: Skin is warm and dry.  Neurological:     General: No focal deficit present.     Mental Status: She is alert and oriented to person, place, and time. Mental status is at baseline.      ED Treatments / Results  Labs (  all labs ordered are listed, but only abnormal results are displayed) Labs Reviewed  COMPREHENSIVE METABOLIC PANEL - Abnormal; Notable for the following components:      Result Value   Glucose, Bld 117 (*)    All other components within normal limits  CBC WITH DIFFERENTIAL/PLATELET - Abnormal; Notable for the following components:   RDW 11.4 (*)    All other components within normal limits  BRAIN NATRIURETIC PEPTIDE  LIPASE, BLOOD  I-STAT TROPONIN, ED  I-STAT TROPONIN, ED    EKG EKG Interpretation  Date/Time:  Monday July 03 2018 09:23:17 EST Ventricular Rate:  68 PR Interval:    QRS Duration: 91 QT Interval:  423 QTC Calculation: 450 R Axis:   94 Text Interpretation:  Sinus  rhythm Borderline right axis deviation Borderline T abnormalities, anterior leads Borderline ST elevation, inferior leads Baseline wander in lead(s) V4 Confirmed by Dene Gentry [phone]) on 07/03/2018 9:27:05 AM   Radiology Dg Chest Port 1 View  Result Date: 07/03/2018 CLINICAL DATA:  Chest pain.  Nausea.  Chills. EXAM: PORTABLE CHEST 1 VIEW COMPARISON:  None. FINDINGS: Upper normal heart size. Vascular congestion. No consolidation or pulmonary edema. No pneumothorax or pleural effusion. IMPRESSION: Vascular congestion without pulmonary edema. Electronically Signed   By: Marybelle Killings M.D.   On: 07/03/2018 10:10   Ct Angio Chest/abd/pel For Dissection W And/or Wo Contrast  Result Date: 07/03/2018 CLINICAL DATA:  Acute chest and abdominal pain this morning. Evaluate for dissection. EXAM: CT ANGIOGRAPHY CHEST, ABDOMEN AND PELVIS TECHNIQUE: Multidetector CT imaging through the chest, abdomen and pelvis was performed using the standard protocol during bolus administration of intravenous contrast. Multiplanar reconstructed images and MIPs were obtained and reviewed to evaluate the vascular anatomy. CONTRAST:  100 cc Isovue-300 COMPARISON:  None. FINDINGS: CTA CHEST FINDINGS Vascular Findings: No evidence of thoracic aortic aneurysm or dissection on this nongated examination. Review of the precontrast images are negative for the presence of an intramural hematoma. No definitive perivascular stranding. Conventional configuration of the aortic arch. The branch vessels of the aortic arch appear widely patent throughout their imaged courses. Normal heart size.  No pericardial effusion. Although this examination was not tailored for the evaluation the pulmonary arteries, there are no discrete filling defects within the central pulmonary arterial tree to suggest central pulmonary embolism. Normal caliber of the main pulmonary artery. ------------------------------------------------------------- Thoracic aortic  measurements: Sinotubular junction 27 mm as measured in greatest oblique coronal dimension. Proximal ascending aorta 30 mm as measured in greatest oblique axial dimension at the level of the main pulmonary artery. Aortic arch aorta 25 mm as measured in greatest oblique sagittal dimension. Proximal descending thoracic aorta 23 mm as measured in greatest oblique axial dimension at the level of the main pulmonary artery. Distal descending thoracic aorta 22 mm as measured in greatest oblique axial dimension at the level of the diaphragmatic hiatus. Review of the MIP images confirms the above findings. ------------------------------------------------------------- Non-Vascular Findings: Mediastinum/Lymph Nodes: No bulky mediastinal, hilar or axillary lymphadenopathy. Lungs/Pleura: Minimal dependent subpleural ground-glass atelectasis. No discrete focal airspace opacities. No pleural effusion or pneumothorax. The central pulmonary airways appear widely patent. No discrete pulmonary nodules. Musculoskeletal: No acute or aggressive osseous abnormalities. Post bilateral breast augmentation. Punctate (sub 3 mm) hypoattenuating right-sided thyroid nodules/cysts. _________________________________________________________ _________________________________________________________ CTA ABDOMEN AND PELVIS FINDINGS VASCULAR Aorta: Normal caliber of the abdominal aorta. There is no significant atherosclerotic plaque within the abdominal aorta. No abdominal aortic dissection or periaortic stranding. Celiac: There is mild tortuosity involving the origin of  the celiac artery, not resulting in hemodynamically significant stenosis. Conventional branching pattern. SMA: Widely patent without a hemodynamically significant narrowing. Conventional branching pattern. Renals: Solitary bilaterally; the bilateral renal arteries are widely patent without hemodynamically significant narrowing. No vessel irregularity to suggest FMD. IMA: Widely patent  without hemodynamically significant narrowing. Inflow: The bilateral common, external and internal iliac arteries are of normal caliber and widely patent without hemodynamically significant narrowing. Veins: The IVC and pelvic venous system appear widely patent on this arterial phase examination. Review of the MIP images confirms the above findings. _________________________________________________________ NON-VASCULAR Evaluation the abdominal organs is limited to the arterial phase of enhancement. Hepatobiliary: Normal hepatic contour. There is an approximately 1.3 cm hypoattenuating (15 Hounsfield unit) cyst within the dome of the left lobe of the liver. There is an approximately 1.2 x 0.9 cm hyperenhancing lesion within the subcapsular aspect of the left lobe of the liver (image 294, series 8; coronal image 43, series 11, incompletely evaluated on the present examination. Normal appearance of the gallbladder given degree distention. No radiopaque gallstones. No intra or extrahepatic biliary duct dilatation. No ascites. Pancreas: Pancreatic duct appears dilated at the level of the proximal body measuring approximately 1.3 x 1.2 cm (axial image 108, series 8; coronal image 51, series 11) with associated dilatation of the downstream pancreatic duct measuring at least 0.5 cm (image 113, series 8). There is a potential hypoattenuating mass involving the proximal body of the pancreas measuring approximately 6.0 x 2.5 x 3.4 cm (image 113, series 8; coronal image 40, series 11), suboptimally evaluated secondary to arterial phase of enhancement. No definitive downstream pancreatic atrophy. No peripancreatic stranding. The adjacent vasculature appears patent. Spleen: Normal arterial phase appearance of the spleen. Adrenals/Urinary Tract: There is symmetric enhancement the bilateral kidneys. No definite renal stones this postcontrast examination. No discrete renal lesions. No urine obstruction or perinephric stranding.  Normal appearance the bilateral adrenal glands. Normal appearance of the urinary bladder given degree distention. Stomach/Bowel: Nonobstructive bowel gas pattern. The bowel is normal in course and caliber without discrete area of wall thickening. Normal appearance of the terminal ileum. The appendix is not visualized, however there is no pericecal inflammatory change. No pneumoperitoneum, pneumatosis or portal venous gas. Lymphatic: No bulky retroperitoneal, mesenteric, pelvic or inguinal lymphadenopathy. Reproductive: Normal appearance of the pelvic organs. There is a small amount of presumably physiologic free fluid in the pelvic cul-de-sac. No discrete adnexal lesion. Other: Regional soft tissues appear normal. Musculoskeletal: No acute or aggressive osseous abnormalities. Review of the MIP images confirms the above findings. IMPRESSION: Chest CTA impression: 1. No acute cardiopulmonary disease. Specifically, no evidence of thoracic aortic aneurysm or dissection. No evidence of central pulmonary embolism. Abdomen pelvis CTA impression: 1. No acute findings within the abdomen or pelvis. Specifically, no evidence of abdominal aortic aneurysm, dissection or mesenteric ischemia. 2. Pancreatic ductal dilatation with questioned approximately 6.0 cm hypoattenuating mass involving the proximal body of pancreas, incompletely evaluated on this arterial phase examination. Further evaluation with MRCP is advised. 3. Indeterminate approximately 1.2 cm hyperenhancing lesion within the subcapsular aspect of the left lobe of the liver, potentially representative of a perfusion anomaly or flash filling hemangioma, though incompletely characterized on the present examination. This lesion may also be further evaluated at the time of the recommended MRCP. Critical Value/emergent results were called by telephone at the time of interpretation on 07/03/2018 at 11:54 am to Dr. Dene Gentry , who verbally acknowledged these results.  Electronically Signed   By: Eldridge Abrahams.D.  On: 07/03/2018 11:57    Procedures Procedures (including critical care time) CRITICAL CARE Performed by: Valarie Merino   Total critical care time: 30 minutes  Critical care time was exclusive of separately billable procedures and treating other patients.  Critical care was necessary to treat or prevent imminent or life-threatening deterioration.  Critical care was time spent personally by me on the following activities: development of treatment plan with patient and/or surrogate as well as nursing, discussions with consultants, evaluation of patient's response to treatment, examination of patient, obtaining history from patient or surrogate, ordering and performing treatments and interventions, ordering and review of laboratory studies, ordering and review of radiographic studies, pulse oximetry and re-evaluation of patient's condition.   Medications Ordered in ED Medications  sodium chloride 0.9 % bolus 1,000 mL (has no administration in time range)  ondansetron (ZOFRAN) injection 4 mg (has no administration in time range)     Initial Impression / Assessment and Plan / ED Course  I have reviewed the triage vital signs and the nursing notes.  Pertinent labs & imaging results that were available during my care of the patient were reviewed by me and considered in my medical decision making (see chart for details).     MDM  Screen complete  Patient is presenting for evaluation of acute onset epigastric abdominal pain.  Patient's presentation initially was concerning given her significant discomfort.  Screening labs and EKG do not suggest the evidence of cardiac ischemia.  Of note, at time of this dictation the patient's lipase is still being diluted down.  It is greater than 400.  CT imaging of the thorax demonstrated an ill-defined area at the pancreatic head.  This possibly could represent pancreatitis versus possible mass.  MRCP  is recommended by radiology for further evaluation.  Patient will require further work-up and evaluation as inpatient.  Hospitalist service is aware of case and will evaluate for admission.    [facility] is paged and will be made aware of the case. Dr. Rosalie Gums is aware of case and will consult.    Final Clinical Impressions(s) / ED Diagnoses   Final diagnoses:  Acute pancreatitis, unspecified complication status, unspecified pancreatitis type    ED Discharge Orders    None       Valarie Merino, MD 07/03/18 1223    Valarie Merino, MD 07/03/18 1300

## 2018-07-03 NOTE — H&P (Signed)
History and Physical    Alyssa Harper RXV:[id number] DOB: 07-21-1960 DOA: 07/03/2018  PCP: No primary care provider on file.  Patient coming from: home   I have personally briefly reviewed patient's old medical records in [place] and Care everywhere.   Chief Complaint: Nausea vomiting epigastric pain and multiple episodes of diarrhea.  HPI: Alyssa Harper is a 58 y.o. female with no significant medical history presents to the hospital with intractable nausea vomiting, midepigastric pain, retrosternal pain and multiple episodes of loose watery diarrhea starting today morning.  According to the patient, she was at her usual state of health until she went to bed last night.  Early morning today she noted retrosternal discomfort and pain, had epigastric pain that was 10 out of 10 at intensity, radiating to retrosternum causing multiple episodes of vomiting.  To relieve the symptoms, she took 2 doses of Pepto-Bismol and then she started having loose watery diarrhea.  She had 6 or 7 episodes of loose watery diarrhea.  She never had similar symptoms in the past.  She does have occasional heartburn and takes Pepto-Bismol as needed.  She occasionally takes ibuprofen. Denies any fever or chills.  Denies any weight loss.  Denies any hemoptysis, hematemesis or hematochezia.  Denies any melena.  She does occasionally have constipation.  Denies any exertional chest pain.  Denies any headache, dizziness, lightheadedness.  She took probiotic and some green tea first time last night. Patient does not have any history of pancreatitis or gallbladder disease.  She does drink 1 drink of alcohol every night.  Does not smoke.  Denies any history of cholesterol problems.  ED Course: Patient is hemodynamically stable, however in epigastric and generalized abdominal pain.  No vomiting or diarrhea since coming to the hospital.  Her lipase was 4000.  Due to concern about chest pain and abdominal pain underwent CTA of the chest and  abdomen, vascularity is normal.  Gallbladder is normal.  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  LFTs are normal.  Given multiple rounds of morphine and Zofran.  ER also notified GI.  Review of Systems: As per HPI otherwise 10 point review of systems negative.    Past Medical History:  Diagnosis Date  . Sjogren's disease ([place])     History reviewed. No pertinent surgical history.   reports that she has never smoked. She does not have any smokeless tobacco history on file. She reports current alcohol use of about 7.0 standard drinks of alcohol per week. She reports that she does not use drugs.  No Known Allergies  History reviewed. No pertinent family history.   Prior to Admission medications   Medication Sig Start Date End Date Taking? Authorizing Provider  bismuth subsalicylate (PEPTO BISMOL) 262 MG chewable tablet Chew 524 mg by mouth as needed for indigestion or diarrhea or loose stools.   Yes [provider]  ibuprofen (ADVIL,MOTRIN) 200 MG tablet Take 200 mg by mouth every 6 (six) hours as needed for moderate pain.   Yes [provider]  OVER THE COUNTER MEDICATION Take 1 tablet by mouth daily. PB8 Probiotic   Yes [provider]  OVER THE COUNTER MEDICATION Take 1 tablet by mouth daily. Thermo green tea   Yes [provider]    Physical Exam: Vitals:   07/03/18 1030 07/03/18 1045 07/03/18 1130 07/03/18 1200  BP: 133/74 123/71 113/65 103/67  Pulse: 61 63 74 69  Resp: 19 16 13  13  Temp:      TempSrc:      SpO2: 100% 100% 96% 97%  Weight:      Height:        Constitutional: NAD, calm, comfortable Vitals:   07/03/18 1030 07/03/18 1045 07/03/18 1130 07/03/18 1200  BP: 133/74 123/71 113/65 103/67  Pulse: 61 63 74 69  Resp: 19 16 13 13   Temp:      TempSrc:      SpO2: 100% 100% 96% 97%  Weight:      Height:       Eyes: PERRL, lids and conjunctivae normal ENMT:  Mucous membranes are moist. Posterior pharynx clear of any exudate or lesions.Normal dentition.  Neck: normal, supple, no masses, no thyromegaly Respiratory: clear to auscultation bilaterally, no wheezing, no crackles. Normal respiratory effort. No accessory muscle use.  Cardiovascular: Regular rate and rhythm, no murmurs / rubs / gallops. No extremity edema. 2+ pedal pulses. No carotid bruits.  Abdomen: Diffuse moderate tenderness all over the abdomen.  No rigidity.  No guarding.  No hepatosplenomegaly. Bowel sounds positive.  Musculoskeletal: no clubbing / cyanosis. No joint deformity upper and lower extremities. Good ROM, no contractures. Normal muscle tone.  Skin: no rashes, lesions, ulcers. No induration Neurologic: CN 2-12 grossly intact. Sensation intact, DTR normal. Strength 5/5 in all 4.  Psychiatric: Normal judgment and insight. Alert and oriented x 3. Normal mood.     Labs on Admission: I have personally reviewed following labs and imaging studies  CBC: Recent Labs  Lab 07/03/18 0936  WBC 8.4  NEUTROABS 7.4  HGB 14.1  HCT 41.4  MCV 95.8  PLT 272   Basic Metabolic Panel: Recent Labs  Lab 07/03/18 0936  NA 138  K 3.7  CL 103  CO2 23  GLUCOSE 117*  BUN 10  CREATININE 0.82  CALCIUM 9.4   GFR: Estimated Creatinine Clearance: 63.2 mL/min (by C-G formula based on SCr of 0.82 mg/dL). Liver Function Tests: Recent Labs  Lab 07/03/18 0936  AST 23  ALT 17  ALKPHOS 59  BILITOT 1.1  PROT 7.1  ALBUMIN 4.5   Recent Labs  Lab 07/03/18 0936  LIPASE 4,125*   No results for input(s): AMMONIA in the last 168 hours. Coagulation Profile: No results for input(s): INR, PROTIME in the last 168 hours. Cardiac Enzymes: No results for input(s): CKTOTAL, CKMB, CKMBINDEX, TROPONINI in the last 168 hours. BNP (last 3 results) No results for input(s): PROBNP in the last 8760 hours. HbA1C: No results for input(s): HGBA1C in the last 72 hours. CBG: No results for input(s):  GLUCAP in the last 168 hours. Lipid Profile: No results for input(s): CHOL, HDL, LDLCALC, TRIG, CHOLHDL, LDLDIRECT in the last 72 hours. Thyroid Function Tests: No results for input(s): TSH, T4TOTAL, FREET4, T3FREE, THYROIDAB in the last 72 hours. Anemia Panel: No results for input(s): VITAMINB12, FOLATE, FERRITIN, TIBC, IRON, RETICCTPCT in the last 72 hours. Urine analysis: No results found for: COLORURINE, APPEARANCEUR, LABSPEC, [place], GLUCOSEU, HGBUR, BILIRUBINUR, KETONESUR, PROTEINUR, UROBILINOGEN, NITRITE, LEUKOCYTESUR  Radiological Exams on Admission: Dg Chest Port 1 View  Result Date: 07/03/2018 CLINICAL DATA:  Chest pain.  Nausea.  Chills. EXAM: PORTABLE CHEST 1 VIEW COMPARISON:  None. FINDINGS: Upper normal heart size. Vascular congestion. No consolidation or pulmonary edema. No pneumothorax or pleural effusion. IMPRESSION: Vascular congestion without pulmonary edema. Electronically Signed   By: Marybelle Killings M.D.   On: 07/03/2018 10:10   Ct Angio Chest/abd/pel For Dissection W And/or Wo Contrast  Result  Date: 07/03/2018 CLINICAL DATA:  Acute chest and abdominal pain this morning. Evaluate for dissection. EXAM: CT ANGIOGRAPHY CHEST, ABDOMEN AND PELVIS TECHNIQUE: Multidetector CT imaging through the chest, abdomen and pelvis was performed using the standard protocol during bolus administration of intravenous contrast. Multiplanar reconstructed images and MIPs were obtained and reviewed to evaluate the vascular anatomy. CONTRAST:  100 cc Isovue-300 COMPARISON:  None. FINDINGS: CTA CHEST FINDINGS Vascular Findings: No evidence of thoracic aortic aneurysm or dissection on this nongated examination. Review of the precontrast images are negative for the presence of an intramural hematoma. No definitive perivascular stranding. Conventional configuration of the aortic arch. The branch vessels of the aortic arch appear widely patent throughout their imaged courses. Normal heart size.  No  pericardial effusion. Although this examination was not tailored for the evaluation the pulmonary arteries, there are no discrete filling defects within the central pulmonary arterial tree to suggest central pulmonary embolism. Normal caliber of the main pulmonary artery. ------------------------------------------------------------- Thoracic aortic measurements: Sinotubular junction 27 mm as measured in greatest oblique coronal dimension. Proximal ascending aorta 30 mm as measured in greatest oblique axial dimension at the level of the main pulmonary artery. Aortic arch aorta 25 mm as measured in greatest oblique sagittal dimension. Proximal descending thoracic aorta 23 mm as measured in greatest oblique axial dimension at the level of the main pulmonary artery. Distal descending thoracic aorta 22 mm as measured in greatest oblique axial dimension at the level of the diaphragmatic hiatus. Review of the MIP images confirms the above findings. ------------------------------------------------------------- Non-Vascular Findings: Mediastinum/Lymph Nodes: No bulky mediastinal, hilar or axillary lymphadenopathy. Lungs/Pleura: Minimal dependent subpleural ground-glass atelectasis. No discrete focal airspace opacities. No pleural effusion or pneumothorax. The central pulmonary airways appear widely patent. No discrete pulmonary nodules. Musculoskeletal: No acute or aggressive osseous abnormalities. Post bilateral breast augmentation. Punctate (sub 3 mm) hypoattenuating right-sided thyroid nodules/cysts. _________________________________________________________ _________________________________________________________ CTA ABDOMEN AND PELVIS FINDINGS VASCULAR Aorta: Normal caliber of the abdominal aorta. There is no significant atherosclerotic plaque within the abdominal aorta. No abdominal aortic dissection or periaortic stranding. Celiac: There is mild tortuosity involving the origin of the celiac artery, not resulting in  hemodynamically significant stenosis. Conventional branching pattern. SMA: Widely patent without a hemodynamically significant narrowing. Conventional branching pattern. Renals: Solitary bilaterally; the bilateral renal arteries are widely patent without hemodynamically significant narrowing. No vessel irregularity to suggest FMD. IMA: Widely patent without hemodynamically significant narrowing. Inflow: The bilateral common, external and internal iliac arteries are of normal caliber and widely patent without hemodynamically significant narrowing. Veins: The IVC and pelvic venous system appear widely patent on this arterial phase examination. Review of the MIP images confirms the above findings. _________________________________________________________ NON-VASCULAR Evaluation the abdominal organs is limited to the arterial phase of enhancement. Hepatobiliary: Normal hepatic contour. There is an approximately 1.3 cm hypoattenuating (15 Hounsfield unit) cyst within the dome of the left lobe of the liver. There is an approximately 1.2 x 0.9 cm hyperenhancing lesion within the subcapsular aspect of the left lobe of the liver (image 294, series 8; coronal image 43, series 11, incompletely evaluated on the present examination. Normal appearance of the gallbladder given degree distention. No radiopaque gallstones. No intra or extrahepatic biliary duct dilatation. No ascites. Pancreas: Pancreatic duct appears dilated at the level of the proximal body measuring approximately 1.3 x 1.2 cm (axial image 108, series 8; coronal image 51, series 11) with associated dilatation of the downstream pancreatic duct measuring at least 0.5 cm (image 113, series 8). There is a  potential hypoattenuating mass involving the proximal body of the pancreas measuring approximately 6.0 x 2.5 x 3.4 cm (image 113, series 8; coronal image 40, series 11), suboptimally evaluated secondary to arterial phase of enhancement. No definitive downstream  pancreatic atrophy. No peripancreatic stranding. The adjacent vasculature appears patent. Spleen: Normal arterial phase appearance of the spleen. Adrenals/Urinary Tract: There is symmetric enhancement the bilateral kidneys. No definite renal stones this postcontrast examination. No discrete renal lesions. No urine obstruction or perinephric stranding. Normal appearance the bilateral adrenal glands. Normal appearance of the urinary bladder given degree distention. Stomach/Bowel: Nonobstructive bowel gas pattern. The bowel is normal in course and caliber without discrete area of wall thickening. Normal appearance of the terminal ileum. The appendix is not visualized, however there is no pericecal inflammatory change. No pneumoperitoneum, pneumatosis or portal venous gas. Lymphatic: No bulky retroperitoneal, mesenteric, pelvic or inguinal lymphadenopathy. Reproductive: Normal appearance of the pelvic organs. There is a small amount of presumably physiologic free fluid in the pelvic cul-de-sac. No discrete adnexal lesion. Other: Regional soft tissues appear normal. Musculoskeletal: No acute or aggressive osseous abnormalities. Review of the MIP images confirms the above findings. IMPRESSION: Chest CTA impression: 1. No acute cardiopulmonary disease. Specifically, no evidence of thoracic aortic aneurysm or dissection. No evidence of central pulmonary embolism. Abdomen pelvis CTA impression: 1. No acute findings within the abdomen or pelvis. Specifically, no evidence of abdominal aortic aneurysm, dissection or mesenteric ischemia. 2. Pancreatic ductal dilatation with questioned approximately 6.0 cm hypoattenuating mass involving the proximal body of pancreas, incompletely evaluated on this arterial phase examination. Further evaluation with MRCP is advised. 3. Indeterminate approximately 1.2 cm hyperenhancing lesion within the subcapsular aspect of the left lobe of the liver, potentially representative of a perfusion  anomaly or flash filling hemangioma, though incompletely characterized on the present examination. This lesion may also be further evaluated at the time of the recommended MRCP. Critical Value/emergent results were called by telephone at the time of interpretation on 07/03/2018 at 11:54 am to Dr. Dene Gentry , who verbally acknowledged these results. Electronically Signed   By: Sandi Mariscal M.D.   On: 07/03/2018 11:57    EKG: Independently reviewed.  Normal sinus rhythm.  No ST-T wave changes.  Assessment/Plan Principal Problem:   Acute pancreatitis Active Problems:   Abnormal CT of the abdomen   Nausea and vomiting   Sjogren's disease ([facility])   Acute idiopathic pancreatitis: No evidence of gallbladder disease and CT scan, LFTs normal with no evidence of bile duct obstruction. Does have abnormal looking mass in the pancreatic head, less likely pancreatic pseudocyst given 6 hours of symptom onset. Agree with admission.  Patient was given 1 L normal saline, will give 1 more liter of normal saline. N.p.o., adequate IV fluids, adequate IV opiates and nausea medications for pain relief. Liver function test, renal function test and electrolytes in the morning.  Lipase in the morning. We will check A1c, will check fasting lipid profile and triglycerides. counseled that she should refrain from even minimal alcohol.   Abnormal CT scan of the abdomen: Shows abnormal pancreatic head and vascular CT scan.  She also has some cyst on her liver.  Patient will need MRI/MRCP.  GI has been consulted by ER.  Will await their recommendation to order appropriate type of testing.  Chest pain: Atypical.  Due to #1.  Ruled out ischemia.  DVT prophylaxis: Subcu Lovenox. Code Status: Full code. Family Communication: Husband and sister who is also Librarian, academic with the hospital  at the bedside. Disposition Plan: Home. Consults called: GI. Admission status: Inpatient.   Barb Merino MD [facility] Pager [phone]  If 7PM-7AM, please contact night-coverage [email or web address] Password [email or web address]  07/03/2018, 12:43 PM

## 2018-07-03 NOTE — ED Triage Notes (Signed)
Pt coming from home c/o L sided CP that began around 0230 this am, described as crushing; endorses N/V/D (3 episodes vomiting, 4-5 episodes of diarrhea), radiates to back; no hx of same; pt says she began taking green tea pills yesterday

## 2018-07-04 LAB — COMPREHENSIVE METABOLIC PANEL
ALT: 12 U/L (ref 0–44)
AST: 14 U/L — AB (ref 15–41)
Albumin: 2.9 g/dL — ABNORMAL LOW (ref 3.5–5.0)
Alkaline Phosphatase: 41 U/L (ref 38–126)
Anion gap: 7 (ref 5–15)
BUN: 6 mg/dL (ref 6–20)
CO2: 22 mmol/L (ref 22–32)
Calcium: 7.9 mg/dL — ABNORMAL LOW (ref 8.9–10.3)
Chloride: 109 mmol/L (ref 98–111)
Creatinine, Ser: 0.71 mg/dL (ref 0.44–1.00)
GFR calc Af Amer: >60 mL/min (ref >60)
GFR calc non Af Amer: >60 mL/min (ref >60)
Glucose, Bld: 101 mg/dL — ABNORMAL HIGH (ref 70–99)
Potassium: 3.7 mmol/L (ref 3.5–5.1)
Sodium: 138 mmol/L (ref 135–145)
Total Bilirubin: 0.9 mg/dL (ref 0.3–1.2)
Total Protein: 4.9 g/dL — ABNORMAL LOW (ref 6.5–8.1)

## 2018-07-04 LAB — CBC
HEMATOCRIT: 32 % — AB (ref 36.0–46.0)
Hemoglobin: 10.7 g/dL — ABNORMAL LOW (ref 12.0–15.0)
MCH: 32.7 pg (ref 26.0–34.0)
MCHC: 33.4 g/dL (ref 30.0–36.0)
MCV: 97.9 fL (ref 80.0–100.0)
Platelets: 188 10*3/uL (ref 150–400)
RBC: 3.27 MIL/uL — ABNORMAL LOW (ref 3.87–5.11)
RDW: 11.6 % (ref 11.5–15.5)
WBC: 5.1 10*3/uL (ref 4.0–10.5)
nRBC: 0 % (ref 0.0–0.2)

## 2018-07-04 LAB — LIPID PANEL
Cholesterol: 162 mg/dL (ref 0–200)
HDL: 66 mg/dL (ref >40)
LDL Cholesterol: 84 mg/dL (ref 0–99)
Total CHOL/HDL Ratio: 2.5 RATIO
Triglycerides: 62 mg/dL (ref <150)
VLDL: 12 mg/dL (ref 0–40)

## 2018-07-04 LAB — LIPASE, BLOOD: Lipase: 669 U/L — ABNORMAL HIGH (ref 11–51)

## 2018-07-04 LAB — HEMOGLOBIN A1C
Hgb A1c MFr Bld: 5.3 % (ref 4.8–5.6)
Mean Plasma Glucose: 105.41 mg/dL

## 2018-07-04 LAB — TSH: TSH: 0.809 u[IU]/mL (ref 0.350–4.500)

## 2018-07-04 LAB — CANCER ANTIGEN 19-9: CA 19-9: 19 U/mL (ref 0–35)

## 2018-07-04 MED ORDER — ZOLPIDEM TARTRATE 5 MG PO TABS
5.0000 mg | ORAL_TABLET | Freq: Once | ORAL | Status: AC
  Administered 2018-07-04: 5 mg via ORAL
  Filled 2018-07-04: qty 1

## 2018-07-04 MED ORDER — SODIUM CHLORIDE 0.9 % IV SOLN
INTRAVENOUS | Status: DC
  Administered 2018-07-04: 18:00:00 via INTRAVENOUS

## 2018-07-04 NOTE — [place] Notes (Signed)
Report given to Lilli Few, RN at [facility]

## 2018-07-04 NOTE — Progress Notes (Signed)
PROGRESS NOTE    Alyssa Harper  ERD:[id number] DOB: 27-Oct-1960 DOA: 07/03/2018 PCP: Kathyrn Lass, MD   Brief Narrative:  HPI On 07/03/2018 by Dr. Barb Merino Alyssa Harper is a 58 y.o. female with no significant medical history presents to the hospital with intractable nausea vomiting, midepigastric pain, retrosternal pain and multiple episodes of loose watery diarrhea starting today morning.  According to the patient, she was at her usual state of health until she went to bed last night.  Early morning today she noted retrosternal discomfort and pain, had epigastric pain that was 10 out of 10 at intensity, radiating to retrosternum causing multiple episodes of vomiting.  To relieve the symptoms, she took 2 doses of Pepto-Bismol and then she started having loose watery diarrhea.  She had 6 or 7 episodes of loose watery diarrhea.  She never had similar symptoms in the past.  She does have occasional heartburn and takes Pepto-Bismol as needed.  She occasionally takes ibuprofen. Denies any fever or chills.  Denies any weight loss.  Denies any hemoptysis, hematemesis or hematochezia.  Denies any melena.  She does occasionally have constipation.  Denies any exertional chest pain.  Denies any headache, dizziness, lightheadedness.  She took probiotic and some green tea first time last night. Patient does not have any history of pancreatitis or gallbladder disease.  She does drink 1 drink of alcohol every night.  Does not smoke.  Denies any history of cholesterol problems.  Interim history Admitted with pancreatitis, found to have pancreatic mass on MRI/MRCP.  Patient be transferred to [facility] for EUS on 07/05/2018. Assessment & Plan   Acute pancreatitis -unknown cause, possibly due to pancreatic mass -Lipase on admission 4125, currently down to 669 -CTA chest/abdomen/pelvis: No acute cardiopulmonary disease.  No PE, thoracic aortic aneurysm or dissection.  No acute findings in the abdomen or  pelvis.  Pancreatic ductal dilatation with questioned approximately 6 cm hypoattenuating mass involving the proximal body of the pancreas.  Indeterminate approximate 1.2 cm hyperenhancing lesion within the subscapular aspect of the left lobe of the liver. -MRI/MRCP: Caliber change of the pancreatic duct in the mid pancreas associated with a subtle ovoid lesion.  Proximal to this mid pancreatic lesion the duct is significantly dilated and distally the duct is normal.  Concern for pancreatic neoplasm.  Enhancing lesion in the left subscapular left lateral hepatic lobe is indeterminate but favored for small focal nodular hyperplasia or benign lesion.  Metastatic disease is not favored. -Gastroenterology consulted and appreciated, plan for EUS on 07/05/2018 at [facility] long hospital -Continue conservative management with IV fluids, clear liquid diet, pain control, antiemetics  Pancreatic lesion/mass -Plan as above -Call GI, Dr. Alvy Bimler, consulted -[id number]  Chest pain -likely secondary to the above -trop negative  Normocytic Anemia -unk baseline hemoglobin -hemoglobin on admission was 14.1, currently down to 10.7 -Suspect drop likely due to dilutional component as patient has been receiving IV fluids  DVT Prophylaxis  lovenox  Code Status: Full  Family Communication: Family at bedside  Disposition Plan: Admitted.  Will transfer to [facility] long hospital for EUS on 07/05/2018 along with oncology consultation.  Consultants Gastroenterology Oncology  Procedures  MRCP  Antibiotics   Anti-infectives (From admission, onward)   None      Subjective:   Alyssa Harper seen and examined today.  Patient denies further abdominal pain, N/V. Denies chest pain, shortness no breath, dizziness, headache.    Objective:   Vitals:   07/03/18 2117 07/04/18 0159 07/04/18 5631  07/04/18 0532  BP: 108/74 97/63  106/62  Pulse: (!) 56 (!) 55  61  Resp: 18 18  18   Temp: 98.4 F (36.9 C) (!) 97.5 F  (36.4 C) 97.8 F (36.6 C) 98.5 F (36.9 C)  TempSrc: Oral Oral Oral Oral  SpO2: 98% 98%  97%  Weight:      Height:        Intake/Output Summary (Last 24 hours) at 07/04/2018 1330 Last data filed at 07/04/2018 0700 Gross per 24 hour  Intake 2900 ml  Output -  Net 2900 ml   Filed Weights   07/03/18 0925  Weight: 53.5 kg    Exam  General: Well developed, well nourished, NAD, appears stated age  HEENT: NCAT, mucous membranes moist.   Neck: Supple  Cardiovascular: S1 S2 auscultated, no rubs, murmurs or gallops. Regular rate and rhythm.  Respiratory: Clear to auscultation bilaterally with equal chest rise  Abdomen: Soft, nontender, nondistended, + bowel sounds  Extremities: warm dry without cyanosis clubbing or edema  Neuro: AAOx3, nonfocal  Psych: Pleasant, appropriate mood and affect   Data Reviewed: I have personally reviewed following labs and imaging studies  CBC: Recent Labs  Lab 07/03/18 0936 07/04/18 0252  WBC 8.4 5.1  NEUTROABS 7.4  --   HGB 14.1 10.7*  HCT 41.4 32.0*  MCV 95.8 97.9  PLT 270 505   Basic Metabolic Panel: Recent Labs  Lab 07/03/18 0936 07/04/18 0252  NA 138 138  K 3.7 3.7  CL 103 109  CO2 23 22  GLUCOSE 117* 101*  BUN 10 6  CREATININE 0.82 0.71  CALCIUM 9.4 7.9*   GFR: Estimated Creatinine Clearance: 64.7 mL/min (by C-G formula based on SCr of 0.71 mg/dL). Liver Function Tests: Recent Labs  Lab 07/03/18 0936 07/04/18 0252  AST 23 14*  ALT 17 12  ALKPHOS 59 41  BILITOT 1.1 0.9  PROT 7.1 4.9*  ALBUMIN 4.5 2.9*   Recent Labs  Lab 07/03/18 0936 07/04/18 0252  LIPASE 4,125* 669*   No results for input(s): AMMONIA in the last 168 hours. Coagulation Profile: No results for input(s): INR, PROTIME in the last 168 hours. Cardiac Enzymes: No results for input(s): CKTOTAL, CKMB, CKMBINDEX, TROPONINI in the last 168 hours. BNP (last 3 results) No results for input(s): PROBNP in the last 8760 hours. HbA1C: Recent  Labs    07/04/18 0252  HGBA1C 5.3   CBG: No results for input(s): GLUCAP in the last 168 hours. Lipid Profile: Recent Labs    07/04/18 0252  CHOL 162  HDL 66  LDLCALC 84  TRIG 62  CHOLHDL 2.5   Thyroid Function Tests: Recent Labs    07/04/18 0252  TSH 0.809   Anemia Panel: No results for input(s): VITAMINB12, FOLATE, FERRITIN, TIBC, IRON, RETICCTPCT in the last 72 hours. Urine analysis: No results found for: COLORURINE, APPEARANCEUR, LABSPEC, PHURINE, GLUCOSEU, HGBUR, BILIRUBINUR, KETONESUR, PROTEINUR, UROBILINOGEN, NITRITE, LEUKOCYTESUR Sepsis Labs: @LABRCNTIP (procalcitonin:4,lacticidven:4)  )No results found for this or any previous visit (from the past 240 hour(s)).    Radiology Studies: Dg Chest Port 1 View  Result Date: 07/03/2018 CLINICAL DATA:  Chest pain.  Nausea.  Chills. EXAM: PORTABLE CHEST 1 VIEW COMPARISON:  None. FINDINGS: Upper normal heart size. Vascular congestion. No consolidation or pulmonary edema. No pneumothorax or pleural effusion. IMPRESSION: Vascular congestion without pulmonary edema. Electronically Signed   By: Marybelle Killings M.D.   On: 07/03/2018 10:10   Mr Abdomen Mrcp Moise Boring Contast  Result Date: 07/04/2018 CLINICAL  DATA:  Indeterminate pancreatic lesion. Pancreatic duct dilatation. Epigastric pain. EXAM: MRI ABDOMEN WITHOUT AND WITH CONTRAST (INCLUDING MRCP) TECHNIQUE: Multiplanar multisequence MR imaging of the abdomen was performed both before and after the administration of intravenous contrast. Heavily T2-weighted images of the biliary and pancreatic ducts were obtained, and three-dimensional MRCP images were rendered by post processing. CONTRAST:  7 mL Gadavist COMPARISON:  CT 07/03/2000 FINDINGS: Lower chest: Lung bases are clear. Hepatobiliary: In lateral segment of the LEFT hepatic lobe there is a nonenhancing cyst measuring 21 mm. Several smaller central cysts within liver. Along the anterior sub capsular LEFT lateral hepatic lobe there is a  small early enhancing lesion measuring 8 mm (image 1 51/1401). This lesion is not identified on the T2 weighted series and is isointense on the more delayed vascular series. This corresponds the hyperenhancing lesion on comparison CT. Pancreas: There is an ovoid lesion in the mid pancreas measuring 20 mm x 15 mm which is hyperintense on T2 weighted imaging (image 24/5). Proximal to this lesion there is a significant ductal dilatation with the main pancreatic duct measuring up to 6 mm. This ductal dilatation extends to the pancreatic tail. Distal to this lesion the pancreatic duct is normal caliber. On the precontrast T1 weighted imaging lesion is more difficult to define but appears hypointense (series 10/2). Lesion not readily identified on the post-contrast imaging. Head and uncinate the pancreas are normal. Spleen: Normal spleen Adrenals/urinary tract: Adrenal glands and kidneys are normal. Stomach/Bowel: Stomach limited view of the bowel is unremarkable. Vascular/Lymphatic: No upper abdominal adenopathy. Reproductive: Other: No free fluid. Musculoskeletal: No aggressive osseous lesion. IMPRESSION: 1. Abrupt caliber change of the pancreatic duct in the mid pancreas associated with an subtle ovoid lesion. Proximal to this mid pancreatic lesion the duct is significantly dilated and distally the duct is normal. Concern for pancreatic neoplasm. Recommend endoscopic ultrasound with tissue sampling. 2. Enhancing lesion in the subcapsular LEFT lateral hepatic lobe is indeterminate but favored small focal nodular hyperplasia or other benign lesion. Metastatic disease is not favored. Additional benign hepatic cysts within the liver. 3.   no evidence of metastatic adenopathy Electronically Signed   By: Suzy Bouchard M.D.   On: 07/04/2018 08:53   Ct Angio Chest/abd/pel For Dissection W And/or Wo Contrast  Result Date: 07/03/2018 CLINICAL DATA:  Acute chest and abdominal pain this morning. Evaluate for dissection.  EXAM: CT ANGIOGRAPHY CHEST, ABDOMEN AND PELVIS TECHNIQUE: Multidetector CT imaging through the chest, abdomen and pelvis was performed using the standard protocol during bolus administration of intravenous contrast. Multiplanar reconstructed images and MIPs were obtained and reviewed to evaluate the vascular anatomy. CONTRAST:  100 cc Isovue-300 COMPARISON:  None. FINDINGS: CTA CHEST FINDINGS Vascular Findings: No evidence of thoracic aortic aneurysm or dissection on this nongated examination. Review of the precontrast images are negative for the presence of an intramural hematoma. No definitive perivascular stranding. Conventional configuration of the aortic arch. The branch vessels of the aortic arch appear widely patent throughout their imaged courses. Normal heart size.  No pericardial effusion. Although this examination was not tailored for the evaluation the pulmonary arteries, there are no discrete filling defects within the central pulmonary arterial tree to suggest central pulmonary embolism. Normal caliber of the main pulmonary artery. ------------------------------------------------------------- Thoracic aortic measurements: Sinotubular junction 27 mm as measured in greatest oblique coronal dimension. Proximal ascending aorta 30 mm as measured in greatest oblique axial dimension at the level of the main pulmonary artery. Aortic arch aorta 25 mm as measured  in greatest oblique sagittal dimension. Proximal descending thoracic aorta 23 mm as measured in greatest oblique axial dimension at the level of the main pulmonary artery. Distal descending thoracic aorta 22 mm as measured in greatest oblique axial dimension at the level of the diaphragmatic hiatus. Review of the MIP images confirms the above findings. ------------------------------------------------------------- Non-Vascular Findings: Mediastinum/Lymph Nodes: No bulky mediastinal, hilar or axillary lymphadenopathy. Lungs/Pleura: Minimal dependent  subpleural ground-glass atelectasis. No discrete focal airspace opacities. No pleural effusion or pneumothorax. The central pulmonary airways appear widely patent. No discrete pulmonary nodules. Musculoskeletal: No acute or aggressive osseous abnormalities. Post bilateral breast augmentation. Punctate (sub 3 mm) hypoattenuating right-sided thyroid nodules/cysts. _________________________________________________________ _________________________________________________________ CTA ABDOMEN AND PELVIS FINDINGS VASCULAR Aorta: Normal caliber of the abdominal aorta. There is no significant atherosclerotic plaque within the abdominal aorta. No abdominal aortic dissection or periaortic stranding. Celiac: There is mild tortuosity involving the origin of the celiac artery, not resulting in hemodynamically significant stenosis. Conventional branching pattern. SMA: Widely patent without a hemodynamically significant narrowing. Conventional branching pattern. Renals: Solitary bilaterally; the bilateral renal arteries are widely patent without hemodynamically significant narrowing. No vessel irregularity to suggest FMD. IMA: Widely patent without hemodynamically significant narrowing. Inflow: The bilateral common, external and internal iliac arteries are of normal caliber and widely patent without hemodynamically significant narrowing. Veins: The IVC and pelvic venous system appear widely patent on this arterial phase examination. Review of the MIP images confirms the above findings. _________________________________________________________ NON-VASCULAR Evaluation the abdominal organs is limited to the arterial phase of enhancement. Hepatobiliary: Normal hepatic contour. There is an approximately 1.3 cm hypoattenuating (15 Hounsfield unit) cyst within the dome of the left lobe of the liver. There is an approximately 1.2 x 0.9 cm hyperenhancing lesion within the subcapsular aspect of the left lobe of the liver (image 294, series  8; coronal image 43, series 11, incompletely evaluated on the present examination. Normal appearance of the gallbladder given degree distention. No radiopaque gallstones. No intra or extrahepatic biliary duct dilatation. No ascites. Pancreas: Pancreatic duct appears dilated at the level of the proximal body measuring approximately 1.3 x 1.2 cm (axial image 108, series 8; coronal image 51, series 11) with associated dilatation of the downstream pancreatic duct measuring at least 0.5 cm (image 113, series 8). There is a potential hypoattenuating mass involving the proximal body of the pancreas measuring approximately 6.0 x 2.5 x 3.4 cm (image 113, series 8; coronal image 40, series 11), suboptimally evaluated secondary to arterial phase of enhancement. No definitive downstream pancreatic atrophy. No peripancreatic stranding. The adjacent vasculature appears patent. Spleen: Normal arterial phase appearance of the spleen. Adrenals/Urinary Tract: There is symmetric enhancement the bilateral kidneys. No definite renal stones this postcontrast examination. No discrete renal lesions. No urine obstruction or perinephric stranding. Normal appearance the bilateral adrenal glands. Normal appearance of the urinary bladder given degree distention. Stomach/Bowel: Nonobstructive bowel gas pattern. The bowel is normal in course and caliber without discrete area of wall thickening. Normal appearance of the terminal ileum. The appendix is not visualized, however there is no pericecal inflammatory change. No pneumoperitoneum, pneumatosis or portal venous gas. Lymphatic: No bulky retroperitoneal, mesenteric, pelvic or inguinal lymphadenopathy. Reproductive: Normal appearance of the pelvic organs. There is a small amount of presumably physiologic free fluid in the pelvic cul-de-sac. No discrete adnexal lesion. Other: Regional soft tissues appear normal. Musculoskeletal: No acute or aggressive osseous abnormalities. Review of the MIP  images confirms the above findings. IMPRESSION: Chest CTA impression: 1. No acute cardiopulmonary disease.  Specifically, no evidence of thoracic aortic aneurysm or dissection. No evidence of central pulmonary embolism. Abdomen pelvis CTA impression: 1. No acute findings within the abdomen or pelvis. Specifically, no evidence of abdominal aortic aneurysm, dissection or mesenteric ischemia. 2. Pancreatic ductal dilatation with questioned approximately 6.0 cm hypoattenuating mass involving the proximal body of pancreas, incompletely evaluated on this arterial phase examination. Further evaluation with MRCP is advised. 3. Indeterminate approximately 1.2 cm hyperenhancing lesion within the subcapsular aspect of the left lobe of the liver, potentially representative of a perfusion anomaly or flash filling hemangioma, though incompletely characterized on the present examination. This lesion may also be further evaluated at the time of the recommended MRCP. Critical Value/emergent results were called by telephone at the time of interpretation on 07/03/2018 at 11:54 am to Dr. Dene Gentry , who verbally acknowledged these results. Electronically Signed   By: Sandi Mariscal M.D.   On: 07/03/2018 11:57     Scheduled Meds: . alum & mag hydroxide-simeth  30 mL Oral Once  . enoxaparin (LOVENOX) injection  40 mg Subcutaneous Q24H  . LORazepam  0.5 mg Intravenous Once   Continuous Infusions: . sodium chloride 150 mL/hr at 07/04/18 8657  . famotidine (PEPCID) IV 20 mg (07/04/18 0949)     LOS: 1 day   Time Spent in minutes   30 minutes  Danasia Baker D.O. on 07/04/2018 at 1:30 PM  Between 7am to 7pm - Please see pager noted on [email or web address]  After 7pm go to [email or web address]  And look for the night coverage person covering for me after hours  [facility] Office  [phone]

## 2018-07-04 NOTE — Progress Notes (Signed)
Transferred to [facility] via carelink

## 2018-07-04 NOTE — Progress Notes (Signed)
[facility] Progress Note  Alyssa Harper 58 y.o. 05-18-1961  CC: Pancreatic body mass   Subjective: She is feeling better.  Abdominal pain is improving.  No bowel movements today.  Denies nausea vomiting.  MRI findings discussed with the patient and patient's family.  ROS : negative for chest pain and shortness of breath.  Objective: Vital signs in last 24 hours: Vitals:   07/04/18 0204 07/04/18 0532  BP:  106/62  Pulse:  61  Resp:  18  Temp: 97.8 F (36.6 C) 98.5 F (36.9 C)  SpO2:  97%    Physical Exam:  General:  Alert, cooperative, no distress, appears stated age  Head:  Normocephalic, without obvious abnormality, atraumatic  Eyes:  , EOM's intact,   Lungs:   Clear to auscultation bilaterally, respirations unlabored  Heart:  Regular rate and rhythm, S1, S2 normal  Abdomen:   Soft, non-tender, nondistended, bowel sounds present.  No peritoneal signs,   Extremities: Extremities normal, atraumatic, no  edema       Lab Results: Recent Labs    07/03/18 0936 07/04/18 0252  NA 138 138  K 3.7 3.7  CL 103 109  CO2 23 22  GLUCOSE 117* 101*  BUN 10 6  CREATININE 0.82 0.71  CALCIUM 9.4 7.9*   Recent Labs    07/03/18 0936 07/04/18 0252  AST 23 14*  ALT 17 12  ALKPHOS 59 41  BILITOT 1.1 0.9  PROT 7.1 4.9*  ALBUMIN 4.5 2.9*   Recent Labs    07/03/18 0936 07/04/18 0252  WBC 8.4 5.1  NEUTROABS 7.4  --   HGB 14.1 10.7*  HCT 41.4 32.0*  MCV 95.8 97.9  PLT 270 188   No results for input(s): LABPROT, INR in the last 72 hours.    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  Concerning for pancreatic neoplasm.  CA 19-9 normal. -Drop in hemoglobin.  No overt bleeding.  Probably dilutional.  Recommendations ----------------------- -MRI findings discussed with patient and patient's family.  Case discussed with Dr. Paulita Fujita.  Plan for EUS tomorrow morning at 830 at [place] n.p.o. past midnight.  Soft  diet today. - D/W nursing staff, endoscopy nurse as well as primary attending regarding need to transport patient tomorrow morning to  [facility] long for procedure.   Otis Brace MD, [place] 07/04/2018, 11:46 AM  Contact #  [phone]

## 2018-07-04 NOTE — H&P (View-Only) (Signed)
[facility] Progress Note  Alyssa Harper 58 y.o. Apr 20, 1961  CC: Pancreatic body mass   Subjective: She is feeling better.  Abdominal pain is improving.  No bowel movements today.  Denies nausea vomiting.  MRI findings discussed with the patient and patient's family.  ROS : negative for chest pain and shortness of breath.  Objective: Vital signs in last 24 hours: Vitals:   07/04/18 0204 07/04/18 0532  BP:  106/62  Pulse:  61  Resp:  18  Temp: 97.8 F (36.6 C) 98.5 F (36.9 C)  SpO2:  97%    Physical Exam:  General:  Alert, cooperative, no distress, appears stated age  Head:  Normocephalic, without obvious abnormality, atraumatic  Eyes:  , EOM's intact,   Lungs:   Clear to auscultation bilaterally, respirations unlabored  Heart:  Regular rate and rhythm, S1, S2 normal  Abdomen:   Soft, non-tender, nondistended, bowel sounds present.  No peritoneal signs,   Extremities: Extremities normal, atraumatic, no  edema       Lab Results: Recent Labs    07/03/18 0936 07/04/18 0252  NA 138 138  K 3.7 3.7  CL 103 109  CO2 23 22  GLUCOSE 117* 101*  BUN 10 6  CREATININE 0.82 0.71  CALCIUM 9.4 7.9*   Recent Labs    07/03/18 0936 07/04/18 0252  AST 23 14*  ALT 17 12  ALKPHOS 59 41  BILITOT 1.1 0.9  PROT 7.1 4.9*  ALBUMIN 4.5 2.9*   Recent Labs    07/03/18 0936 07/04/18 0252  WBC 8.4 5.1  NEUTROABS 7.4  --   HGB 14.1 10.7*  HCT 41.4 32.0*  MCV 95.8 97.9  PLT 270 188   No results for input(s): LABPROT, INR in the last 72 hours.    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  Concerning for pancreatic neoplasm.  CA 19-9 normal. -Drop in hemoglobin.  No overt bleeding.  Probably dilutional.  Recommendations ----------------------- -MRI findings discussed with patient and patient's family.  Case discussed with Dr. Paulita Fujita.  Plan for EUS tomorrow morning at 830 at [place] n.p.o. past midnight.  Soft  diet today. - D/W nursing staff, endoscopy nurse as well as primary attending regarding need to transport patient tomorrow morning to  [facility] long for procedure.   Otis Brace MD, [place] 07/04/2018, 11:46 AM  Contact #  [phone]

## 2018-07-04 NOTE — Plan of Care (Signed)
  Problem: Education: Goal: Knowledge of General Education information will improve Description Including pain rating scale, medication(s)/side effects and non-pharmacologic comfort measures Outcome: Progressing Note:  POC and pain management reviewed with pt. and husband.   Problem: Pain Managment: Goal: General experience of comfort will improve Outcome: Progressing

## 2018-07-04 NOTE — Plan of Care (Signed)
Pt stable this afternoon with no s/s of distress. Rn medicated pt for pain. No changes to note. Pt has been tearful and upset today, concerned about her new cancer diagnosis.

## 2018-07-04 NOTE — Progress Notes (Signed)
   07/04/18 1125  Clinical Encounter Type  Visited With Patient and family together  Visit Type Follow-up;Other (Comment) (AD)  Referral From Chaplain  Consult/Referral To Chaplain  The chaplain followed up with the completion of Pt. AD.  While waiting for the notary and witnesses, the chaplain was spiritually present as the Pt. and family discussed life plans and the Pt. recent diagnosis.  The chaplain noted family, career, and faith as giving meaning to the Pt. life.  The chaplain offered F/U spiritual care before praying with Pt. and family. The Pt. was given the original and a copy of the AD.  A copy of the AD was placed in the Pt. Chart.

## 2018-07-04 NOTE — Progress Notes (Signed)
   07/04/18 1033  Clinical Encounter Type  Visited With Patient and family together;Health care provider  Visit Type Initial;Other (Comment) (AD)  Referral From Palliative care team  Consult/Referral To Chaplain  The chaplain responded to consult for Pt. AD.  Healthcare team and family were present at time of chaplain visit.  The chaplain will F/U.  The chaplain communicated her plans with Pt. RN-Daisy.

## 2018-07-05 ENCOUNTER — Encounter (HOSPITAL_COMMUNITY)
Admission: EM | Disposition: A | Discharge status: Home / Self Care | Payer: Self-pay | Source: Home / Self Care | Attending: Internal Medicine

## 2018-07-05 ENCOUNTER — Encounter (HOSPITAL_COMMUNITY): Payer: Self-pay | Admitting: Emergency Medicine

## 2018-07-05 ENCOUNTER — Inpatient Hospital Stay (HOSPITAL_COMMUNITY): Payer: 59 | Admitting: Certified Registered"

## 2018-07-05 ENCOUNTER — Telehealth: Payer: Self-pay | Admitting: Hematology and Oncology

## 2018-07-05 DIAGNOSIS — K8500 Idiopathic acute pancreatitis without necrosis or infection: Secondary | ICD-10-CM

## 2018-07-05 DIAGNOSIS — R112 Nausea with vomiting, unspecified: Secondary | ICD-10-CM

## 2018-07-05 DIAGNOSIS — K8689 Other specified diseases of pancreas: Secondary | ICD-10-CM

## 2018-07-05 DIAGNOSIS — R110 Nausea: Secondary | ICD-10-CM

## 2018-07-05 DIAGNOSIS — K869 Disease of pancreas, unspecified: Secondary | ICD-10-CM

## 2018-07-05 HISTORY — PX: ESOPHAGOGASTRODUODENOSCOPY: SHX5428

## 2018-07-05 HISTORY — PX: EUS: SHX5427

## 2018-07-05 HISTORY — PX: FINE NEEDLE ASPIRATION: SHX5430

## 2018-07-05 LAB — CBC
HCT: 34.6 % — ABNORMAL LOW (ref 36.0–46.0)
Hemoglobin: 11.3 g/dL — ABNORMAL LOW (ref 12.0–15.0)
MCH: 32.8 pg (ref 26.0–34.0)
MCHC: 32.7 g/dL (ref 30.0–36.0)
MCV: 100.6 fL — ABNORMAL HIGH (ref 80.0–100.0)
Platelets: 178 10*3/uL (ref 150–400)
RBC: 3.44 MIL/uL — ABNORMAL LOW (ref 3.87–5.11)
RDW: 11.6 % (ref 11.5–15.5)
WBC: 3.8 10*3/uL — ABNORMAL LOW (ref 4.0–10.5)
nRBC: 0 % (ref 0.0–0.2)

## 2018-07-05 LAB — LIPASE, BLOOD: Lipase: 56 U/L — ABNORMAL HIGH (ref 11–51)

## 2018-07-05 LAB — COMPREHENSIVE METABOLIC PANEL
ALT: 13 U/L (ref 0–44)
AST: 21 U/L (ref 15–41)
Albumin: 3.4 g/dL — ABNORMAL LOW (ref 3.5–5.0)
Alkaline Phosphatase: 44 U/L (ref 38–126)
Anion gap: 10 (ref 5–15)
BUN: 8 mg/dL (ref 6–20)
CO2: 22 mmol/L (ref 22–32)
Calcium: 8.2 mg/dL — ABNORMAL LOW (ref 8.9–10.3)
Chloride: 108 mmol/L (ref 98–111)
Creatinine, Ser: 0.62 mg/dL (ref 0.44–1.00)
GFR calc Af Amer: >60 mL/min (ref >60)
GFR calc non Af Amer: >60 mL/min (ref >60)
Glucose, Bld: 88 mg/dL (ref 70–99)
POTASSIUM: 3.7 mmol/L (ref 3.5–5.1)
Sodium: 140 mmol/L (ref 135–145)
Total Bilirubin: 0.9 mg/dL (ref 0.3–1.2)
Total Protein: 5.6 g/dL — ABNORMAL LOW (ref 6.5–8.1)

## 2018-07-05 SURGERY — UPPER ENDOSCOPIC ULTRASOUND (EUS) LINEAR
Anesthesia: Monitor Anesthesia Care

## 2018-07-05 MED ORDER — OXYMETAZOLINE HCL 0.05 % NA SOLN
1.0000 | Freq: Two times a day (BID) | NASAL | Status: DC | PRN
  Filled 2018-07-05: qty 15

## 2018-07-05 MED ORDER — ZOLPIDEM TARTRATE 5 MG PO TABS: 5.0000 mg | ORAL_TABLET | Freq: Every evening | ORAL | 0 refills | Status: DC | PRN

## 2018-07-05 MED ORDER — BUTALBITAL-APAP-CAFFEINE 50-325-40 MG PO TABS: 1.0000 | ORAL_TABLET | ORAL | Status: DC | PRN

## 2018-07-05 MED ORDER — PROPOFOL 10 MG/ML IV BOLUS
INTRAVENOUS | Status: AC
  Filled 2018-07-05: qty 20

## 2018-07-05 MED ORDER — PROPOFOL 10 MG/ML IV BOLUS
INTRAVENOUS | Status: AC
  Filled 2018-07-05: qty 40

## 2018-07-05 MED ORDER — PROMETHAZINE HCL 25 MG PO TABS: 25.0000 mg | ORAL_TABLET | Freq: Four times a day (QID) | ORAL | Status: DC | PRN

## 2018-07-05 MED ORDER — PROMETHAZINE HCL 25 MG PO TABS: 25.0000 mg | ORAL_TABLET | Freq: Four times a day (QID) | ORAL | 0 refills | Status: DC | PRN

## 2018-07-05 MED ORDER — PROPOFOL 10 MG/ML IV BOLUS
INTRAVENOUS | Status: DC | PRN
  Administered 2018-07-05 (×7): 20 mg via INTRAVENOUS

## 2018-07-05 MED ORDER — OXYCODONE HCL 5 MG PO TABS
5.0000 mg | ORAL_TABLET | ORAL | Status: DC | PRN
  Administered 2018-07-05: 5 mg via ORAL
  Filled 2018-07-05: qty 1

## 2018-07-05 MED ORDER — PROPOFOL 500 MG/50ML IV EMUL
INTRAVENOUS | Status: DC | PRN
  Administered 2018-07-05: 100 ug/kg/min via INTRAVENOUS

## 2018-07-05 MED ORDER — LACTATED RINGERS IV SOLN
INTRAVENOUS | Status: DC | PRN
  Administered 2018-07-05: 09:00:00 via INTRAVENOUS

## 2018-07-05 MED ORDER — ALPRAZOLAM 0.25 MG PO TABS: 0.2500 mg | ORAL_TABLET | Freq: Two times a day (BID) | ORAL | 0 refills | Status: DC | PRN

## 2018-07-05 NOTE — Interval H&P Note (Signed)
History and Physical Interval Note:  07/05/2018 8:29 AM  Alyssa Harper  has presented today for surgery, with the diagnosis of Pancreatic Mass  The various methods of treatment have been discussed with the patient and family. After consideration of risks, benefits and other options for treatment, the patient has consented to  Procedure(s): UPPER ENDOSCOPIC ULTRASOUND (EUS) LINEAR and Radial (N/A) as a surgical intervention .  The patient's history has been reviewed, patient examined, no change in status, stable for surgery.  I have reviewed the patient's chart and labs.  Questions were answered to the patient's satisfaction.     [name]

## 2018-07-05 NOTE — Discharge Planning (Signed)
Patient IV removed.  RN assessment and VS revealed stability for DC to home.  Discharge papers given, explained and educated.  Informed of suggested FU appts and appts made.  Scripts e-scribed to [facility] ([address]) - per patient request.  Nausea and pain under control prior to DC.  Once ready, will be wheeled to front and family transporting home via car.

## 2018-07-05 NOTE — Discharge Instructions (Signed)
Acute Pancreatitis  Acute pancreatitis happens when the pancreas gets swollen. The pancreas is a large gland behind the stomach. The pancreas helps control blood sugar. It also makes enzymes that help digest food. This condition happens when the enzymes attack the pancreas and damage it. Most attacks last a couple of days and are dangerous. The lungs, heart, and kidneys may stop working. What are the causes?  Alcohol abuse.  Drug abuse.  Gallstones.  Some medicines.  Some chemicals.  Infection.  Damage caused by an accident.Beulah Gandy (abdominal) surgery.  In some cases, the cause is not known. What are the signs or symptoms?  Pain in the upper belly and back.  Swelling of the belly  Feeling sick to your stomach (nausea) and throwing up (vomiting). How is this treated?  You will probably have to stay in the hospital. ? Treatment may include:  Fluid through an IV.  A tube to remove stomach contents and stop you from throwing up.  Not eating for 3-4 days.  Pain medicine.  Antibiotic medicines if you have an infection.  Surgery on the pancreas or gallbladder. Follow these instructions at home: Eating and drinking   Follow instructions from your doctor about diet.  Eat small meals often. Avoid eating big meals.  Eat foods that do not have a lot of fat in them.  Drink enough fluid to keep your pee (urine) pale yellow.  Do not drink alcohol if it caused your condition. General instructions  Take over-the-counter and prescription medicines only as told by your doctor.  Do not use cigarettes, e-cigarettes, and chewing tobacco. If you need help quitting, ask your doctor.  Get plenty of rest.  If directed, check your blood sugar at home as told by your doctor.  Keep all follow-up visits as told by your doctor. This is important. Contact a doctor if:  You do not get better as quickly as expected.  You have new symptoms.  Your symptoms get worse.  You  have lasting pain or weakness.  You continue to feel sick to your stomach.  You get better and then you have another pain attack.  You have a fever. Get help right away if:  You cannot eat or keep fluids down.  Your pain becomes very bad.  Your skin or the white part of your eyes turns yellow.  You throw up.  You feel dizzy or you pass out.  Your blood sugar is high (over 300 mg/dL). Summary  Acute pancreatitis happens when the pancreas gets swollen.  This condition is usually caused by alcohol abuse, drug abuse, or gallstones.  You will probably have to stay in the hospital for treatment. This information is not intended to replace advice given to you by your health care provider. Make sure you discuss any questions you have with your health care provider. Document Released: 11/24/2007 Document Revised: 10/11/2016 Document Reviewed: 03/11/2015 Elsevier Interactive Patient Education  2019 [employer].  Pancreatic Cancer  Pancreatic cancer is an abnormal growth of cells (tumor) in the pancreas that is cancerous (malignant). The pancreas is a gland located in the abdomen, between the stomach and the spine. The pancreas makes hormones, including insulin and glucagon. These hormones control your blood sugar and help your body use and store energy that comes from food. The pancreas also makes pancreatic juices that help digest food. There are two types of pancreatic cancer:  Exocrine. This is the most common type.  Endocrine. The different types of cancer have  their own causes, risk factors, and treatment. Pancreatic cancer can spread (metastasize) to other parts of the body. What are the causes? The exact cause of this condition is not known. What increases the risk? The following factors may make you more likely to develop this condition:  Age. The risk of pancreatic cancer increases with age. This condition most commonly occurs in people over the age of 15.  Tobacco  use, including cigarettes, chewing tobacco, or e-cigarettes.  Being female.  Being African American.  Having a family history of cancer of the pancreas, colon, or ovaries.  Having diabetes, especially if you were diagnosed as an adult.  Having chronic pancreatitis.  Being exposed to certain chemicals.  Being obese and having a decreased level of physical activity.  Eating a diet that is high in fat and red meat.  Having an infection in your stomach caused by a strain of bacteria (Helicobacter pylori or H. pylori). This infection can lead to ulcers.  Having certain hereditary conditions or gene mutations.  Cirrhosis. This is the damage and scarring of the liver, which may be caused by hepatitis or heavy alcohol use. What are the signs or symptoms? In the early stages, there are often no symptoms of this condition. As the cancer gets worse (progresses), symptoms may vary depending on the type of pancreatic cancer you have. Symptoms include:  Yellowing of the skin or eyes (jaundice).  Weakness.  Abdominal pain.  Diarrhea.  Depression.  Loss of appetite and weight loss.  Indigestion.  Pain in the upper abdomen or upper back.  A lump under the rib cage on the right side.  Nausea and vomiting.  Fatigue.  Stools that are light-colored and greasy-looking, or stools that are black and tarry-looking.  Dark urine.  High blood sugar (hyperglycemia). This may cause increased thirst and frequent urination.  Low blood sugar (hypoglycemia). This may cause confusion, sweating, and a fast heartbeat.  Itchy skin.  Skin that feels uneven. How is this diagnosed? This condition may be diagnosed based on your medical history and a physical exam. This may include checking your skin and eyes for signs of jaundice and checking your abdomen for any changes in the areas near the pancreas. Your health care provider will also check for a collection of excess fluid in the abdomen  (ascites). Tests will also be done, such as:  Blood tests.  Urine tests.  Ultrasound.  CT scan.  MRI.  Removal of a sample of pancreatic tissue to be examined under a microscope (biopsy). Other tests and procedures may also be done. If pancreatic cancer is confirmed, it will be staged to determine its severity and extent. Staging is an assessment of:  The size of the tumor.  Whether the cancer has spread.  Where the cancer has spread. How is this treated? Depending on the type and stage of your pancreatic cancer, treatment may include:  Surgery to remove all or part of the pancreas, or to remove a tumor.  Radiation therapy. This is the use of high-energy rays to kill cancer cells.  Chemotherapy. This is the use of medicines to stop or slow the growth of cancer cells.  Targeted therapy. This treatment targets specific parts of cancer cells and the area around them to block the growth and spread of the cancer. Your health care provider may recommend a combination of surgery, radiation therapy, and chemotherapy. You may be referred to a health care provider who specializes in cancer (oncologist). Follow these instructions at  home: Medicines  Take over-the-counter and prescription medicines only as told by your health care provider.  Do not drive or operate heavy machinery while taking prescription pain medicine. General instructions  Return to your normal activities as told by your health care provider. Ask your health care provider what activities are safe for you.  Do not use tobacco products, including cigarettes, chewing tobacco, or e-cigarettes. If you need help quitting, ask your health care provider.  Maintain a healthy diet.  Drink enough fluid to keep your urine clear or pale yellow.  Consider joining a support group. This may help you learn to cope with the stress of having pancreatic cancer.  Work with your health care provider to manage any side effects of  your treatment.  Keep all follow-up visits as told by your health care provider. This is important. Contact a health care provider if:  You feel nauseous or you vomit.  You have unexplained weight loss. Get help right away if:  You have pain that suddenly gets worse.  You have chest pain or an irregular heartbeat.  You have trouble breathing. You cannot eat or drink without vomiting.  You have blood in your vomit or dark, tarry stools.  Your skin or eyes turn more yellow.  You develop new fatigue or weakness.  You have abdominal bloating or pain. This information is not intended to replace advice given to you by your health care provider. Make sure you discuss any questions you have with your health care provider. Document Released: 05/22/2004 Document Revised: 11/13/2015 Document Reviewed: 05/19/2015 Elsevier Interactive Patient Education  2019 [employer].

## 2018-07-05 NOTE — Progress Notes (Signed)
Alyssa Harper CONSULT NOTE  Patient Care Team: Kathyrn Lass, [name], worrisome for malignancy, [name], [name], [name], MD 07/05/2018 11:34 AM   CHIEF COMPLAINTS/PURPOSE OF CONSULTATION:  Pancreatic mass causing acute pancreatitis, [name]  Her sister, Alyssa Harper and her husband, Alyssa Harper are present This patient is otherwise fairly healthy.  She has background history of Sjgren's syndrome.  She is married with 3 daughters.  She is a Chief Executive Officer.  She does not smoke. She has positive family history of pancreatic cancer in her grandfather  She presented and was admitted through the emergency room 2 days ago due to severe acute lower back pain. She has never suffered from  this pain before.  It was described as 10 out of 10 intensity. She initially have nausea, multiple loose stool and vomiting but she has no further bowel movement since 2 days ago. Blood work showed elevated pancreatic enzyme.   CT angiogram was performed due to concern for possible aortic dissection but that revealed pancreatitis with evidence of mass in the head of the pancreas.  She was admitted to the hospital and has received IV pain medicine, IV fluids and IV antiemetics.  She underwent MRCP which confirmed pancreatic dilation.  MRCP also revealed cysts in the liver along with a lesion measured approximately 20 mm x 15 mm with no associated local invasion.  Subsequently, she underwent EUS and fine-needle aspirate this morning. CA-19-9 is now elevated at 19 The final report from EUS today: - There was no evidence of significant pathology in the left lobe of the liver. - There was no sign of significant pathology in the common bile duct. - A mass was identified in the pancreatic body. Fine needle aspiration performed  Since her procedure this morning, she still have some mild nausea and persistent lower back pain.  She rated her lower back pain at 4 out of 10  MEDICAL HISTORY:  Past Medical History:  Diagnosis Date  . Family history of adverse reaction to anesthesia    "grandmother stopped breathing"  . Headache    "weekly" (07/03/2018)  . High cholesterol   . Mitral valve prolapse   . Pancreatic mass 07/03/2018  . Sjogren's disease ([place])     SURGICAL HISTORY: Past Surgical History:  Procedure Laterality Date  . AUGMENTATION  MAMMAPLASTY Bilateral 2013    SOCIAL HISTORY: Social History   Socioeconomic History  . Marital status: Married    Spouse name: Not on file  . Number of children: Not on file  . Years of education: Not on file  . Highest education level: Not on file  Occupational History  . Not on file  Social Needs  . Financial resource strain: Not on file  .  Food insecurity:    Worry: Not on file    Inability: Not on file  . Transportation needs:    Medical: Not on file    Non-medical: Not on file  Tobacco Use  . Smoking status: Never Smoker  . Smokeless tobacco: Never Used  Substance and Sexual Activity  . Alcohol use: Yes    Alcohol/week: 7.0 standard drinks    Types: 7 Standard drinks or equivalent per week  . Drug use: Never  . Sexual activity: Yes  Lifestyle  . Physical activity:    Days per week: Not on file    Minutes per session: Not on file  . Stress: Not on file  Relationships  . Social connections:    Talks on phone: Not on file    Gets together: Not on file    Attends religious service: Not on file    Active member of club or organization: Not on file    Attends meetings of clubs or organizations: Not on file    Relationship status: Not on file  . Intimate partner violence:    Fear of current or ex partner: Not on file    Emotionally abused: Not on file    Physically abused: Not on file    Forced sexual activity: Not on file  Other Topics Concern  . Not on file  Social History Narrative  . Not on file    FAMILY HISTORY: History reviewed. No pertinent family history.  ALLERGIES:  has No Known Allergies.  MEDICATIONS:  Current Facility-Administered Medications  Medication Dose Route Frequency Provider Last Rate Last Dose  . acetaminophen (TYLENOL) tablet 650 mg  650 mg Oral Q6H PRN Cristal Ford, DO   650 mg at 07/05/18 0444   Or  . acetaminophen (TYLENOL) suppository 650 mg  650 mg Rectal Q6H PRN Cristal Ford, DO      . alum & mag hydroxide-simeth (MAALOX/MYLANTA) 200-200-20 MG/5ML suspension 30 mL  30 mL Oral Once Cristal Ford, DO      . butalbital-acetaminophen-caffeine (FIORICET, ESGIC) [phone] MG per tablet 1 tablet  1 tablet Oral Q4H PRN Cristal Ford, DO      . famotidine (PEPCID) IVPB 20 mg premix  20 mg Intravenous Q12H Cristal Ford, DO 100 mL/hr at 07/04/18 2048 20 mg at 07/04/18  2048  . LORazepam (ATIVAN) injection 0.5 mg  0.5 mg Intravenous Once Cristal Ford, DO      . morphine 4 MG/ML injection 4 mg  4 mg Intravenous Q3H PRN Cristal Ford, DO   2 mg at 07/05/18 0640  . ondansetron (ZOFRAN) tablet 4 mg  4 mg Oral Q6H PRN Cristal Ford, DO       Or  . ondansetron [facility]) injection 4 mg  4 mg Intravenous Q6H PRN Cristal Ford, DO   4 mg at 07/05/18 0640  . oxyCODONE (Oxy IR/ROXICODONE) immediate release tablet 5 mg  5 mg Oral Q3H PRN Callie Bunyard, MD      . oxymetazoline (AFRIN) 0.05 % nasal spray 1 spray  1 spray Each Nare BID PRN Cristal Ford,  DO      . promethazine (PHENERGAN) injection 12.5 mg  12.5 mg Intravenous Q6H PRN Cristal Ford, DO   12.5 mg at 07/03/18 1925  . promethazine (PHENERGAN) tablet 25 mg  25 mg Oral Q6H PRN Alvy Bimler, Cleopha Indelicato, MD        REVIEW OF SYSTEMS:   Constitutional: Denies fevers, chills or abnormal night sweats Eyes: Denies blurriness of vision, double vision or watery eyes Ears, nose, mouth, throat, and face: Denies mucositis or sore throat Respiratory: Denies cough, dyspnea or wheezes Cardiovascular: Denies palpitation, chest discomfort or lower extremity swelling Skin: Denies abnormal skin rashes Lymphatics: Denies new lymphadenopathy or easy bruising Neurological:Denies numbness, tingling or new weaknesses Behavioral/Psych: Mood is stable, no new changes  All other systems were reviewed with the patient and are negative.  PHYSICAL EXAMINATION: ECOG PERFORMANCE STATUS: 1 - Symptomatic but completely ambulatory  Vitals:   07/05/18 1000 07/05/18 1010  BP: 115/90 (!) 126/59  Pulse: 74 75  Resp: (!) 21 19  Temp:    SpO2: 98% 96%   Filed Weights   07/03/18 0925 07/05/18 0500  Weight: 118 lb (53.5 kg) 125 lb 10.6 oz (57 kg)    GENERAL:alert, in mild distress and appears uncomfortable. SKIN: skin color, texture, turgor are normal, no rashes or significant lesions EYES: normal, conjunctiva are pink and  non-injected, sclera clear OROPHARYNX:no exudate, no erythema and lips, buccal mucosa, and tongue normal  NECK: supple, thyroid normal size, non-tender, without nodularity LYMPH:  no palpable lymphadenopathy in the cervical, axillary or inguinal LUNGS: clear to auscultation and percussion with normal breathing effort HEART: regular rate & rhythm and no murmurs and no lower extremity edema ABDOMEN:abdomen soft, non-tender and normal bowel sounds Musculoskeletal:no cyanosis of digits and no clubbing  PSYCH: alert & oriented x 3 with fluent speech NEURO: no focal motor/sensory deficits  LABORATORY DATA:  I have reviewed the data as listed Lab Results  Component Value Date   WBC 3.8 (L) 07/05/2018   HGB 11.3 (L) 07/05/2018   HCT 34.6 (L) 07/05/2018   MCV 100.6 (H) 07/05/2018   PLT 178 07/05/2018   Recent Labs    07/03/18 0936 07/04/18 0252 07/05/18 0435  NA 138 138 140  K 3.7 3.7 3.7  CL 103 109 108  CO2 23 22 22   GLUCOSE 117* 101* 88  BUN 10 6 8   CREATININE 0.82 0.71 0.62  CALCIUM 9.4 7.9* 8.2*  GFRNONAA >60 >60 >60  GFRAA >60 >60 >60  PROT 7.1 4.9* 5.6*  ALBUMIN 4.5 2.9* 3.4*  AST 23 14* 21  ALT 17 12 13   ALKPHOS 59 41 44  BILITOT 1.1 0.9 0.9    RADIOGRAPHIC STUDIES: I have personally reviewed the radiological images as listed and agreed with the findings in the report. Dg Chest Port 1 View  Result Date: 07/03/2018 CLINICAL DATA:  Chest pain.  Nausea.  Chills. EXAM: PORTABLE CHEST 1 VIEW COMPARISON:  None. FINDINGS: Upper normal heart size. Vascular congestion. No consolidation or pulmonary edema. No pneumothorax or pleural effusion. IMPRESSION: Vascular congestion without pulmonary edema. Electronically Signed   By: Marybelle Killings M.D.   On: 07/03/2018 10:10   Mr Abdomen Mrcp Moise Boring Contast  Result Date: 07/04/2018 CLINICAL DATA:  Indeterminate pancreatic lesion. Pancreatic duct dilatation. Epigastric pain. EXAM: MRI ABDOMEN WITHOUT AND WITH CONTRAST (INCLUDING MRCP)  TECHNIQUE: Multiplanar multisequence MR imaging of the abdomen was performed both before and after the administration of intravenous contrast. Heavily T2-weighted images of the biliary and pancreatic  ducts were obtained, and three-dimensional MRCP images were rendered by post processing. CONTRAST:  7 mL Gadavist COMPARISON:  CT 07/03/2000 FINDINGS: Lower chest: Lung bases are clear. Hepatobiliary: In lateral segment of the LEFT hepatic lobe there is a nonenhancing cyst measuring 21 mm. Several smaller central cysts within liver. Along the anterior sub capsular LEFT lateral hepatic lobe there is a small early enhancing lesion measuring 8 mm (image 1 51/1401). This lesion is not identified on the T2 weighted series and is isointense on the more delayed vascular series. This corresponds the hyperenhancing lesion on comparison CT. Pancreas: There is an ovoid lesion in the mid pancreas measuring 20 mm x 15 mm which is hyperintense on T2 weighted imaging (image 24/5). Proximal to this lesion there is a significant ductal dilatation with the main pancreatic duct measuring up to 6 mm. This ductal dilatation extends to the pancreatic tail. Distal to this lesion the pancreatic duct is normal caliber. On the precontrast T1 weighted imaging lesion is more difficult to define but appears hypointense (series 10/2). Lesion not readily identified on the post-contrast imaging. Head and uncinate the pancreas are normal. Spleen: Normal spleen Adrenals/urinary tract: Adrenal glands and kidneys are normal. Stomach/Bowel: Stomach limited view of the bowel is unremarkable. Vascular/Lymphatic: No upper abdominal adenopathy. Reproductive: Other: No free fluid. Musculoskeletal: No aggressive osseous lesion. IMPRESSION: 1. Abrupt caliber change of the pancreatic duct in the mid pancreas associated with an subtle ovoid lesion. Proximal to this mid pancreatic lesion the duct is significantly dilated and distally the duct is normal. Concern for  pancreatic neoplasm. Recommend endoscopic ultrasound with tissue sampling. 2. Enhancing lesion in the subcapsular LEFT lateral hepatic lobe is indeterminate but favored small focal nodular hyperplasia or other benign lesion. Metastatic disease is not favored. Additional benign hepatic cysts within the liver. 3.   no evidence of metastatic adenopathy Electronically Signed   By: Suzy Bouchard M.D.   On: 07/04/2018 08:53   Ct Angio Chest/abd/pel For Dissection W And/or Wo Contrast  Result Date: 07/03/2018 CLINICAL DATA:  Acute chest and abdominal pain this morning. Evaluate for dissection. EXAM: CT ANGIOGRAPHY CHEST, ABDOMEN AND PELVIS TECHNIQUE: Multidetector CT imaging through the chest, abdomen and pelvis was performed using the standard protocol during bolus administration of intravenous contrast. Multiplanar reconstructed images and MIPs were obtained and reviewed to evaluate the vascular anatomy. CONTRAST:  100 cc Isovue-300 COMPARISON:  None. FINDINGS: CTA CHEST FINDINGS Vascular Findings: No evidence of thoracic aortic aneurysm or dissection on this nongated examination. Review of the precontrast images are negative for the presence of an intramural hematoma. No definitive perivascular stranding. Conventional configuration of the aortic arch. The branch vessels of the aortic arch appear widely patent throughout their imaged courses. Normal heart size.  No pericardial effusion. Although this examination was not tailored for the evaluation the pulmonary arteries, there are no discrete filling defects within the central pulmonary arterial tree to suggest central pulmonary embolism. Normal caliber of the main pulmonary artery. ------------------------------------------------------------- Thoracic aortic measurements: Sinotubular junction 27 mm as measured in greatest oblique coronal dimension. Proximal ascending aorta 30 mm as measured in greatest oblique axial dimension at the level of the main pulmonary  artery. Aortic arch aorta 25 mm as measured in greatest oblique sagittal dimension. Proximal descending thoracic aorta 23 mm as measured in greatest oblique axial dimension at the level of the main pulmonary artery. Distal descending thoracic aorta 22 mm as measured in greatest oblique axial dimension at the level of the diaphragmatic hiatus.  Review of the MIP images confirms the above findings. ------------------------------------------------------------- Non-Vascular Findings: Mediastinum/Lymph Nodes: No bulky mediastinal, hilar or axillary lymphadenopathy. Lungs/Pleura: Minimal dependent subpleural ground-glass atelectasis. No discrete focal airspace opacities. No pleural effusion or pneumothorax. The central pulmonary airways appear widely patent. No discrete pulmonary nodules. Musculoskeletal: No acute or aggressive osseous abnormalities. Post bilateral breast augmentation. Punctate (sub 3 mm) hypoattenuating right-sided thyroid nodules/cysts. _________________________________________________________ _________________________________________________________ CTA ABDOMEN AND PELVIS FINDINGS VASCULAR Aorta: Normal caliber of the abdominal aorta. There is no significant atherosclerotic plaque within the abdominal aorta. No abdominal aortic dissection or periaortic stranding. Celiac: There is mild tortuosity involving the origin of the celiac artery, not resulting in hemodynamically significant stenosis. Conventional branching pattern. SMA: Widely patent without a hemodynamically significant narrowing. Conventional branching pattern. Renals: Solitary bilaterally; the bilateral renal arteries are widely patent without hemodynamically significant narrowing. No vessel irregularity to suggest FMD. IMA: Widely patent without hemodynamically significant narrowing. Inflow: The bilateral common, external and internal iliac arteries are of normal caliber and widely patent without hemodynamically significant narrowing. Veins:  The IVC and pelvic venous system appear widely patent on this arterial phase examination. Review of the MIP images confirms the above findings. _________________________________________________________ NON-VASCULAR Evaluation the abdominal organs is limited to the arterial phase of enhancement. Hepatobiliary: Normal hepatic contour. There is an approximately 1.3 cm hypoattenuating (15 Hounsfield unit) cyst within the dome of the left lobe of the liver. There is an approximately 1.2 x 0.9 cm hyperenhancing lesion within the subcapsular aspect of the left lobe of the liver (image 294, series 8; coronal image 43, series 11, incompletely evaluated on the present examination. Normal appearance of the gallbladder given degree distention. No radiopaque gallstones. No intra or extrahepatic biliary duct dilatation. No ascites. Pancreas: Pancreatic duct appears dilated at the level of the proximal body measuring approximately 1.3 x 1.2 cm (axial image 108, series 8; coronal image 51, series 11) with associated dilatation of the downstream pancreatic duct measuring at least 0.5 cm (image 113, series 8). There is a potential hypoattenuating mass involving the proximal body of the pancreas measuring approximately 6.0 x 2.5 x 3.4 cm (image 113, series 8; coronal image 40, series 11), suboptimally evaluated secondary to arterial phase of enhancement. No definitive downstream pancreatic atrophy. No peripancreatic stranding. The adjacent vasculature appears patent. Spleen: Normal arterial phase appearance of the spleen. Adrenals/Urinary Tract: There is symmetric enhancement the bilateral kidneys. No definite renal stones this postcontrast examination. No discrete renal lesions. No urine obstruction or perinephric stranding. Normal appearance the bilateral adrenal glands. Normal appearance of the urinary bladder given degree distention. Stomach/Bowel: Nonobstructive bowel gas pattern. The bowel is normal in course and caliber without  discrete area of wall thickening. Normal appearance of the terminal ileum. The appendix is not visualized, however there is no pericecal inflammatory change. No pneumoperitoneum, pneumatosis or portal venous gas. Lymphatic: No bulky retroperitoneal, mesenteric, pelvic or inguinal lymphadenopathy. Reproductive: Normal appearance of the pelvic organs. There is a small amount of presumably physiologic free fluid in the pelvic cul-de-sac. No discrete adnexal lesion. Other: Regional soft tissues appear normal. Musculoskeletal: No acute or aggressive osseous abnormalities. Review of the MIP images confirms the above findings. IMPRESSION: Chest CTA impression: 1. No acute cardiopulmonary disease. Specifically, no evidence of thoracic aortic aneurysm or dissection. No evidence of central pulmonary embolism. Abdomen pelvis CTA impression: 1. No acute findings within the abdomen or pelvis. Specifically, no evidence of abdominal aortic aneurysm, dissection or mesenteric ischemia. 2. Pancreatic ductal dilatation with questioned approximately 6.0  cm hypoattenuating mass involving the proximal body of pancreas, incompletely evaluated on this arterial phase examination. Further evaluation with MRCP is advised. 3. Indeterminate approximately 1.2 cm hyperenhancing lesion within the subcapsular aspect of the left lobe of the liver, potentially representative of a perfusion anomaly or flash filling hemangioma, though incompletely characterized on the present examination. This lesion may also be further evaluated at the time of the recommended MRCP. Critical Value/emergent results were called by telephone at the time of interpretation on 07/03/2018 at 11:54 am to Dr. Dene Gentry , who verbally acknowledged these results. Electronically Signed   By: Sandi Mariscal M.D.   On: 07/03/2018 11:57

## 2018-07-05 NOTE — Discharge Summary (Signed)
Physician Discharge Summary  Gauri Galvao CBJ:[id number] DOB: [birth year] DOA: 07/03/2018  PCP: Kathyrn Lass, MD  Admit date: 07/03/2018 Discharge date: 07/05/2018  Time spent: 45 minutes  Recommendations for Outpatient Follow-up:  Patient will be discharged to home.  Patient will need to follow up with primary care provider within one week of discharge.  Follow up with Dr. Alvy Bimler, oncology.  Patient should continue medications as prescribed.  Patient should follow a regular diet.   Discharge Diagnoses:  Acute pancreatitis Pancreatic lesion/mass Chest pain Normocytic Anemia  Discharge Condition: Stable  Diet recommendation: regular  Filed Weights   07/03/18 0925 07/05/18 0500  Weight: 53.5 kg 57 kg    History of present illness:  On 07/03/2018 by Dr. Meyer Cory a 58 y.o.femalewithno significant medical history presents to the hospital with intractable nausea vomiting, midepigastric pain, retrosternal pain and multiple episodes of loose watery diarrhea starting today morning. According to the patient, she was at her usual state of health until she went to bed last night. Early morning today she noted retrosternal discomfort and pain, had epigastric pain that was 10 out of 10 at intensity, radiating to retrosternum causing multiple episodes of vomiting. To relieve the symptoms, she took 2 doses of Pepto-Bismol and then she started having loose watery diarrhea. She had 6 or 7 episodes of loose watery diarrhea. She never had similar symptoms in the past. She does have occasional heartburn and takes Pepto-Bismol as needed. She occasionally takes ibuprofen. Denies any fever or chills. Denies any weight loss. Denies any hemoptysis, hematemesis or hematochezia. Denies any melena. She does occasionally have constipation. Denies any exertional chest pain. Denies any headache, dizziness, lightheadedness. She took probiotic and some green tea first time last  night. Patient does not have any history of pancreatitis or gallbladder disease. She does drink 1 drink of alcohol every night. Does not smoke. Denies any history of cholesterol problems.  Hospital Course:  Acute pancreatitis -unknown cause, possibly due to pancreatic mass -Lipase on admission 4125, currently down to 56 -CTA chest/abdomen/pelvis: No acute cardiopulmonary disease.  No PE, thoracic aortic aneurysm or dissection.  No acute findings in the abdomen or pelvis.  Pancreatic ductal dilatation with questioned approximately 6 cm hypoattenuating mass involving the proximal body of the pancreas.  Indeterminate approximate 1.2 cm hyperenhancing lesion within the subscapular aspect of the left lobe of the liver. -MRI/MRCP: Caliber change of the pancreatic duct in the mid pancreas associated with a subtle ovoid lesion.  Proximal to this mid pancreatic lesion the duct is significantly dilated and distally the duct is normal.  Concern for pancreatic neoplasm.  Enhancing lesion in the left subscapular left lateral hepatic lobe is indeterminate but favored for small focal nodular hyperplasia or benign lesion.  Metastatic disease is not favored. -Gastroenterology consulted and appreciated, status post EUS: No evidence of significant pathology in the left lobe of the liver.  No sign of significant pathology in the common bile duct.  Mass identified in pancreatic body.  FNA performed. -Able to tolerate diet -Given pain, will discharge patient with antiemetics  Pancreatic lesion/mass -Plan as above -Oncology, Dr. Alvy Bimler, consulted and appreciated -[id number]  Chest pain -likely secondary to the above -trop negative  Normocytic Anemia -unk baseline hemoglobin -hemoglobin on admission was 14.1, currently down to 11.3 -Suspect drop likely due to dilutional component as patient has been receiving IV fluids  Procedures: MRCP EUS  Consultations: Gastroenterology Oncology  Discharge  Exam: Vitals:   07/05/18 1000 07/05/18 1010  BP: 115/90 (!) 126/59  Pulse: 74 75  Resp: (!) 21 19  Temp:    SpO2: 98% 96%     General: Well developed, well nourished, NAD, appears stated age  HEENT: NCAT,  mucous membranes moist.  Neck: Supple  Cardiovascular: S1 S2 auscultated, no rubs, murmurs or gallops. Regular rate and rhythm.  Respiratory: Clear to auscultation bilaterally with equal chest rise  Abdomen: Soft, nontender, nondistended, + bowel sounds  Extremities: warm dry without cyanosis clubbing or edema  Neuro: AAOx3, nonfocal  Psych: Anxious, however appropriate mood and affect given circumstances  Discharge Instructions Discharge Instructions    Discharge instructions   Complete by:  As directed    Patient will be discharged to home.  Patient will need to follow up with primary care provider within one week of discharge.  Follow up with Dr. Alvy Bimler, oncology.  Patient should continue medications as prescribed.  Patient should follow a regular diet.     Allergies as of 07/05/2018   No Known Allergies     Medication List    TAKE these medications   ALPRAZolam 0.25 MG tablet Commonly known as:  XANAX Take 1 tablet (0.25 mg total) by mouth 2 (two) times daily as needed for anxiety or sleep.   bismuth subsalicylate 703 MG chewable tablet Commonly known as:  PEPTO BISMOL Chew 524 mg by mouth as needed for indigestion or diarrhea or loose stools.   ibuprofen 200 MG tablet Commonly known as:  ADVIL,MOTRIN Take 200 mg by mouth every 6 (six) hours as needed for moderate pain.   OVER THE COUNTER MEDICATION Take 1 tablet by mouth daily. PB8 Probiotic   OVER THE COUNTER MEDICATION Take 1 tablet by mouth daily. Thermo green tea   promethazine 25 MG tablet Commonly known as:  PHENERGAN Take 1 tablet (25 mg total) by mouth every 6 (six) hours as needed for nausea.   zolpidem 5 MG tablet Commonly known as:  AMBIEN Take 1 tablet (5 mg total) by mouth at  bedtime as needed for sleep.      No Known Allergies Follow-up Information    Kathyrn Lass, MD. Schedule an appointment as soon as possible for a visit in 1 week(s).   Specialty:  Family Medicine Why:  Hospital follow up Contact information: [place] [address] [phone]        Heath Lark, MD. Go on 07/07/2018.   Specialty:  Hematology and Oncology Why:  At specified time Contact information: [place] [id number] [phone]        Otis Brace, MD. Go to.   Specialty:  Gastroenterology Why:  As needed Contact information: [place] [address] [place] [address] [phone]            The results of significant diagnostics from this hospitalization (including imaging, microbiology, ancillary and laboratory) are listed below for reference.    Significant Diagnostic Studies: Dg Chest Port 1 View  Result Date: 07/03/2018 CLINICAL DATA:  Chest pain.  Nausea.  Chills. EXAM: PORTABLE CHEST 1 VIEW COMPARISON:  None. FINDINGS: Upper normal heart size. Vascular congestion. No consolidation or pulmonary edema. No pneumothorax or pleural effusion. IMPRESSION: Vascular congestion without pulmonary edema. Electronically Signed   By: Marybelle Killings M.D.   On: 07/03/2018 10:10   Mr Abdomen Mrcp Moise Boring Contast  Result Date: 07/04/2018 CLINICAL DATA:  Indeterminate pancreatic lesion. Pancreatic duct dilatation. Epigastric pain. EXAM: MRI ABDOMEN WITHOUT AND WITH CONTRAST (INCLUDING MRCP)  TECHNIQUE: Multiplanar multisequence MR imaging of the abdomen was performed both before and after the administration of intravenous contrast. Heavily T2-weighted images of the biliary and pancreatic ducts were obtained, and three-dimensional MRCP images were rendered by post processing. CONTRAST:  7 mL Gadavist COMPARISON:  CT 07/03/2000 FINDINGS: Lower chest: Lung bases are clear. Hepatobiliary: In lateral segment of the LEFT hepatic  lobe there is a nonenhancing cyst measuring 21 mm. Several smaller central cysts within liver. Along the anterior sub capsular LEFT lateral hepatic lobe there is a small early enhancing lesion measuring 8 mm (image 1 51/1401). This lesion is not identified on the T2 weighted series and is isointense on the more delayed vascular series. This corresponds the hyperenhancing lesion on comparison CT. Pancreas: There is an ovoid lesion in the mid pancreas measuring 20 mm x 15 mm which is hyperintense on T2 weighted imaging (image 24/5). Proximal to this lesion there is a significant ductal dilatation with the main pancreatic duct measuring up to 6 mm. This ductal dilatation extends to the pancreatic tail. Distal to this lesion the pancreatic duct is normal caliber. On the precontrast T1 weighted imaging lesion is more difficult to define but appears hypointense (series 10/2). Lesion not readily identified on the post-contrast imaging. Head and uncinate the pancreas are normal. Spleen: Normal spleen Adrenals/urinary tract: Adrenal glands and kidneys are normal. Stomach/Bowel: Stomach limited view of the bowel is unremarkable. Vascular/Lymphatic: No upper abdominal adenopathy. Reproductive: Other: No free fluid. Musculoskeletal: No aggressive osseous lesion. IMPRESSION: 1. Abrupt caliber change of the pancreatic duct in the mid pancreas associated with an subtle ovoid lesion. Proximal to this mid pancreatic lesion the duct is significantly dilated and distally the duct is normal. Concern for pancreatic neoplasm. Recommend endoscopic ultrasound with tissue sampling. 2. Enhancing lesion in the subcapsular LEFT lateral hepatic lobe is indeterminate but favored small focal nodular hyperplasia or other benign lesion. Metastatic disease is not favored. Additional benign hepatic cysts within the liver. 3.   no evidence of metastatic adenopathy Electronically Signed   By: Suzy Bouchard M.D.   On: 07/04/2018 08:53   Ct Angio  Chest/abd/pel For Dissection W And/or Wo Contrast  Result Date: 07/03/2018 CLINICAL DATA:  Acute chest and abdominal pain this morning. Evaluate for dissection. EXAM: CT ANGIOGRAPHY CHEST, ABDOMEN AND PELVIS TECHNIQUE: Multidetector CT imaging through the chest, abdomen and pelvis was performed using the standard protocol during bolus administration of intravenous contrast. Multiplanar reconstructed images and MIPs were obtained and reviewed to evaluate the vascular anatomy. CONTRAST:  100 cc Isovue-300 COMPARISON:  None. FINDINGS: CTA CHEST FINDINGS Vascular Findings: No evidence of thoracic aortic aneurysm or dissection on this nongated examination. Review of the precontrast images are negative for the presence of an intramural hematoma. No definitive perivascular stranding. Conventional configuration of the aortic arch. The branch vessels of the aortic arch appear widely patent throughout their imaged courses. Normal heart size.  No pericardial effusion. Although this examination was not tailored for the evaluation the pulmonary arteries, there are no discrete filling defects within the central pulmonary arterial tree to suggest central pulmonary embolism. Normal caliber of the main pulmonary artery. ------------------------------------------------------------- Thoracic aortic measurements: Sinotubular junction 27 mm as measured in greatest oblique coronal dimension. Proximal ascending aorta 30 mm as measured in greatest oblique axial dimension at the level of the main pulmonary artery. Aortic arch aorta 25 mm as measured in greatest oblique sagittal dimension. Proximal descending thoracic aorta 23 mm as measured in greatest oblique axial dimension at  the level of the main pulmonary artery. Distal descending thoracic aorta 22 mm as measured in greatest oblique axial dimension at the level of the diaphragmatic hiatus. Review of the MIP images confirms the above findings.  ------------------------------------------------------------- Non-Vascular Findings: Mediastinum/Lymph Nodes: No bulky mediastinal, hilar or axillary lymphadenopathy. Lungs/Pleura: Minimal dependent subpleural ground-glass atelectasis. No discrete focal airspace opacities. No pleural effusion or pneumothorax. The central pulmonary airways appear widely patent. No discrete pulmonary nodules. Musculoskeletal: No acute or aggressive osseous abnormalities. Post bilateral breast augmentation. Punctate (sub 3 mm) hypoattenuating right-sided thyroid nodules/cysts. _________________________________________________________ _________________________________________________________ CTA ABDOMEN AND PELVIS FINDINGS VASCULAR Aorta: Normal caliber of the abdominal aorta. There is no significant atherosclerotic plaque within the abdominal aorta. No abdominal aortic dissection or periaortic stranding. Celiac: There is mild tortuosity involving the origin of the celiac artery, not resulting in hemodynamically significant stenosis. Conventional branching pattern. SMA: Widely patent without a hemodynamically significant narrowing. Conventional branching pattern. Renals: Solitary bilaterally; the bilateral renal arteries are widely patent without hemodynamically significant narrowing. No vessel irregularity to suggest FMD. IMA: Widely patent without hemodynamically significant narrowing. Inflow: The bilateral common, external and internal iliac arteries are of normal caliber and widely patent without hemodynamically significant narrowing. Veins: The IVC and pelvic venous system appear widely patent on this arterial phase examination. Review of the MIP images confirms the above findings. _________________________________________________________ NON-VASCULAR Evaluation the abdominal organs is limited to the arterial phase of enhancement. Hepatobiliary: Normal hepatic contour. There is an approximately 1.3 cm hypoattenuating (15 Hounsfield  unit) cyst within the dome of the left lobe of the liver. There is an approximately 1.2 x 0.9 cm hyperenhancing lesion within the subcapsular aspect of the left lobe of the liver (image 294, series 8; coronal image 43, series 11, incompletely evaluated on the present examination. Normal appearance of the gallbladder given degree distention. No radiopaque gallstones. No intra or extrahepatic biliary duct dilatation. No ascites. Pancreas: Pancreatic duct appears dilated at the level of the proximal body measuring approximately 1.3 x 1.2 cm (axial image 108, series 8; coronal image 51, series 11) with associated dilatation of the downstream pancreatic duct measuring at least 0.5 cm (image 113, series 8). There is a potential hypoattenuating mass involving the proximal body of the pancreas measuring approximately 6.0 x 2.5 x 3.4 cm (image 113, series 8; coronal image 40, series 11), suboptimally evaluated secondary to arterial phase of enhancement. No definitive downstream pancreatic atrophy. No peripancreatic stranding. The adjacent vasculature appears patent. Spleen: Normal arterial phase appearance of the spleen. Adrenals/Urinary Tract: There is symmetric enhancement the bilateral kidneys. No definite renal stones this postcontrast examination. No discrete renal lesions. No urine obstruction or perinephric stranding. Normal appearance the bilateral adrenal glands. Normal appearance of the urinary bladder given degree distention. Stomach/Bowel: Nonobstructive bowel gas pattern. The bowel is normal in course and caliber without discrete area of wall thickening. Normal appearance of the terminal ileum. The appendix is not visualized, however there is no pericecal inflammatory change. No pneumoperitoneum, pneumatosis or portal venous gas. Lymphatic: No bulky retroperitoneal, mesenteric, pelvic or inguinal lymphadenopathy. Reproductive: Normal appearance of the pelvic organs. There is a small amount of presumably  physiologic free fluid in the pelvic cul-de-sac. No discrete adnexal lesion. Other: Regional soft tissues appear normal. Musculoskeletal: No acute or aggressive osseous abnormalities. Review of the MIP images confirms the above findings. IMPRESSION: Chest CTA impression: 1. No acute cardiopulmonary disease. Specifically, no evidence of thoracic aortic aneurysm or dissection. No evidence of central pulmonary embolism. Abdomen pelvis CTA impression:  1. No acute findings within the abdomen or pelvis. Specifically, no evidence of abdominal aortic aneurysm, dissection or mesenteric ischemia. 2. Pancreatic ductal dilatation with questioned approximately 6.0 cm hypoattenuating mass involving the proximal body of pancreas, incompletely evaluated on this arterial phase examination. Further evaluation with MRCP is advised. 3. Indeterminate approximately 1.2 cm hyperenhancing lesion within the subcapsular aspect of the left lobe of the liver, potentially representative of a perfusion anomaly or flash filling hemangioma, though incompletely characterized on the present examination. This lesion may also be further evaluated at the time of the recommended MRCP. Critical Value/emergent results were called by telephone at the time of interpretation on 07/03/2018 at 11:54 am to Dr. Dene Gentry , who verbally acknowledged these results. Electronically Signed   By: Sandi Mariscal M.D.   On: 07/03/2018 11:57    Microbiology: No results found for this or any previous visit (from the past 240 hour(s)).   Labs: Basic Metabolic Panel: Recent Labs  Lab 07/03/18 0936 07/04/18 0252 07/05/18 0435  NA 138 138 140  K 3.7 3.7 3.7  CL 103 109 108  CO2 23 22 22   GLUCOSE 117* 101* 88  BUN 10 6 8   CREATININE 0.82 0.71 0.62  CALCIUM 9.4 7.9* 8.2*   Liver Function Tests: Recent Labs  Lab 07/03/18 0936 07/04/18 0252 07/05/18 0435  AST 23 14* 21  ALT 17 12 13   ALKPHOS 59 41 44  BILITOT 1.1 0.9 0.9  PROT 7.1 4.9* 5.6*    ALBUMIN 4.5 2.9* 3.4*   Recent Labs  Lab 07/03/18 0936 07/04/18 0252 07/05/18 0435  LIPASE 4,125* 669* 56*   No results for input(s): AMMONIA in the last 168 hours. CBC: Recent Labs  Lab 07/03/18 0936 07/04/18 0252 07/05/18 0435  WBC 8.4 5.1 3.8*  NEUTROABS 7.4  --   --   HGB 14.1 10.7* 11.3*  HCT 41.4 32.0* 34.6*  MCV 95.8 97.9 100.6*  PLT 270 188 178   Cardiac Enzymes: No results for input(s): CKTOTAL, CKMB, CKMBINDEX, TROPONINI in the last 168 hours. BNP: BNP (last 3 results) Recent Labs    07/03/18 0936  BNP 28.9    ProBNP (last 3 results) No results for input(s): PROBNP in the last 8760 hours.  CBG: No results for input(s): GLUCAP in the last 168 hours.     Signed:  Cristal Ford  [facility] 07/05/2018, 11:46 AM

## 2018-07-05 NOTE — Transfer of Care (Signed)
Immediate Anesthesia Transfer of Care Note  Patient: Alyssa Harper  Procedure(s) Performed: UPPER ENDOSCOPIC ULTRASOUND (EUS) LINEAR and Radial (N/A ) FINE NEEDLE ASPIRATION (FNA) LINEAR (N/A )  Patient Location: PACU and Endoscopy Unit  Anesthesia Type:MAC  Level of Consciousness: awake, alert  and oriented  Airway & Oxygen Therapy: Patient Spontanous Breathing and Patient connected to nasal cannula oxygen  Post-op Assessment: Report given to RN and Post -op Vital signs reviewed and stable  Post vital signs: Reviewed and stable  Last Vitals:  Vitals Value Taken Time  BP 124/63 07/05/2018  9:45 AM  Temp    Pulse 82 07/05/2018  9:46 AM  Resp 15 07/05/2018  9:46 AM  SpO2 93 % 07/05/2018  9:46 AM  Vitals shown include unvalidated device data.  Last Pain:  Vitals:   07/05/18 0945  TempSrc:   PainSc: 0-No pain      Patients Stated Pain Goal: 3 (99/24/26 8341)  Complications: No apparent anesthesia complications

## 2018-07-05 NOTE — Anesthesia Preprocedure Evaluation (Signed)
Anesthesia Evaluation  Patient identified by MRN, date of birth, ID band Patient awake    Reviewed: Allergy & Precautions, NPO status , Patient's Chart, lab work & pertinent test results  Airway Mallampati: II  TM Distance: >3 FB Neck ROM: Full    Dental no notable dental hx.    Pulmonary neg pulmonary ROS,    Pulmonary exam normal breath sounds clear to auscultation       Cardiovascular negative cardio ROS Normal cardiovascular exam Rhythm:Regular Rate:Normal     Neuro/Psych  Headaches, negative psych ROS   GI/Hepatic negative GI ROS, Neg liver ROS,   Endo/Other  negative endocrine ROS  Renal/GU negative Renal ROS  negative genitourinary   Musculoskeletal negative musculoskeletal ROS (+)   Abdominal   Peds negative pediatric ROS (+)  Hematology negative hematology ROS (+)   Anesthesia Other Findings   Reproductive/Obstetrics negative OB ROS                             Anesthesia Physical Anesthesia Plan  ASA: II  Anesthesia Plan: MAC   Post-op Pain Management:    Induction: Intravenous  PONV Risk Score and Plan: 2 and Treatment may vary due to age or medical condition  Airway Management Planned: Nasal Cannula  Additional Equipment:   Intra-op Plan:   Post-operative Plan:   Informed Consent: I have reviewed the patients History and Physical, chart, labs and discussed the procedure including the risks, benefits and alternatives for the proposed anesthesia with the patient or authorized representative who has indicated his/her understanding and acceptance.     Dental advisory given  Plan Discussed with: CRNA  Anesthesia Plan Comments:         Anesthesia Quick Evaluation

## 2018-07-05 NOTE — Anesthesia Postprocedure Evaluation (Signed)
Anesthesia Post Note  Patient: Alyssa Harper  Procedure(s) Performed: UPPER ENDOSCOPIC ULTRASOUND (EUS) LINEAR and Radial (N/A ) FINE NEEDLE ASPIRATION (FNA) LINEAR (N/A )     Patient location during evaluation: Endoscopy Anesthesia Type: MAC Level of consciousness: awake and alert Pain management: pain level controlled Vital Signs Assessment: post-procedure vital signs reviewed and stable Respiratory status: spontaneous breathing, nonlabored ventilation and respiratory function stable Cardiovascular status: stable and blood pressure returned to baseline Postop Assessment: no apparent nausea or vomiting Anesthetic complications: no    Last Vitals:  Vitals:   07/05/18 1000 07/05/18 1010  BP: 115/90 (!) 126/59  Pulse: 74 75  Resp: (!) 21 19  Temp:    SpO2: 98% 96%    Last Pain:  Vitals:   07/05/18 1010  TempSrc:   PainSc: 0-No pain                 Lynda Rainwater

## 2018-07-05 NOTE — Telephone Encounter (Signed)
Confirmed appt to see Dr. Alvy Bimler w/pt's husband.

## 2018-07-05 NOTE — Progress Notes (Signed)
Patient seen socially last night and this morning before her procedure and we answered all of her and her husband and . sister's questions and I will be on standby to help in any way I can otherwise await ultrasound and biopsy results and probable [facility] referral to  Follow and if doing well this afternoon can probably go home

## 2018-07-05 NOTE — Anesthesia Procedure Notes (Addendum)
Procedure Name: MAC Date/Time: 07/05/2018 8:36 AM Performed by: Eben Burow, CRNA Pre-anesthesia Checklist: Patient identified, Emergency Drugs available, Suction available, Patient being monitored and Timeout performed Oxygen Delivery Method: Simple face mask Dental Injury: Teeth and Oropharynx as per pre-operative assessment

## 2018-07-05 NOTE — Telephone Encounter (Signed)
Cld and lft a vm for the pt to see Dr. Alvy Bimler on 1/17 at 3pm. I lft the appt infor on the pt's vm.

## 2018-07-05 NOTE — Op Note (Signed)
[facility] Patient Name: Alyssa Harper Procedure Date: 07/05/2018 MRN: [id number] Attending MD: Arta Silence , MD Date of Birth: 01-06-1961 CSN: [id number] Age: 58 Admit Type: Outpatient Procedure:                Upper EUS Indications:              Suspected mass in pancreas on MRCP, Suspected solid                            pancreatic neoplasm, Epigastric abdominal pain Providers:                Arta Silence, MD, Cleda Daub, RN, Janie                            Billups, Technician, Courtney Heys. [place], CRNA Referring MD:             [facility] Medicines:                Monitored Anesthesia Care Complications:            No immediate complications. Estimated Blood Loss:     Estimated blood loss: none. Procedure:                Pre-Anesthesia Assessment:                           - Prior to the procedure, a History and Physical                            was performed, and patient medications and                            allergies were reviewed. The patient's tolerance of                            previous anesthesia was also reviewed. The risks                            and benefits of the procedure and the sedation                            options and risks were discussed with the patient.                            All questions were answered, and informed consent                            was obtained. Prior Anticoagulants: The patient has                            taken Lovenox (enoxaparin), last dose was 1 day                            prior to procedure. ASA Grade Assessment: II - A  patient with mild systemic disease. After reviewing                            the risks and benefits, the patient was deemed in                            satisfactory condition to undergo the procedure.                           After obtaining informed consent, the endoscope was                            passed under direct  vision. Throughout the                            procedure, the patient's blood pressure, pulse, and                            oxygen saturations were monitored continuously. The                            [id number] ([id number]) Olympus Linear EUS was                            introduced through the mouth, and advanced to the                            duodenal bulb. The upper EUS was accomplished                            without difficulty. The patient tolerated the                            procedure well. Scope In: Scope Out: Findings:      ENDOSONOGRAPHIC FINDING: :      No lymphadenopathy seen.      There was no sign of significant endosonographic abnormality in the left       lobe of the liver. No focal pathology was identified.      There was no sign of significant endosonographic abnormality in the       common bile duct. An unremarkable gallbladder was identified.      A round mass was identified in the pancreatic body. The mass was       isoechoic. The mass measured 15 mm by 19 mm in maximal cross-sectional       diameter. The outer margins were smooth. An intact interface was seen       between the mass and the superior mesenteric artery and celiac artery,       suggesting a lack of invasion. No obvious venous vascular involvement.       The remainder of the pancreas was examined. The endosonographic       appearance of parenchyma and the upstream pancreatic duct indicated duct       dilation. Fine needle aspiration for cytology was performed. Color       Doppler imaging was utilized prior to needle puncture  to confirm a lack       of significant vascular structures within the needle path. Four passes       were made with the 25 gauge needle using a transgastric approach. A       stylet was used. A cytotechnologist was present to evaluate the adequacy       of the specimen. The cellularity of the specimen was adequate. Final       cytology results are pending. Estimated  blood loss: none. Impression:               - There was no evidence of significant pathology in                            the left lobe of the liver.                           - There was no sign of significant pathology in the                            common bile duct.                           - A mass was identified in the pancreatic body.                            Fine needle aspiration performed. Moderate Sedation:      None      None Recommendation:           - Return patient to hospital ward for ongoing care.                           - Soft diet today.                           - Continue present medications.                           - Await cytology results.                           - Return to GI clinic PRN.                           - Refer to a surgeon at appointment to be scheduled. Procedure Code(s):        --- Professional ---                           [phone], Esophagogastroduodenoscopy, flexible,                            transoral; with transendoscopic ultrasound-guided                            intramural or transmural fine needle                            aspiration/biopsy(s) (  includes endoscopic                            ultrasound examination of the esophagus, stomach,                            and either the duodenum or a surgically altered                            stomach where the jejunum is examined distal to the                            anastomosis) Diagnosis Code(s):        --- Professional ---                           K86.89, Other specified diseases of pancreas                           R10.13, Epigastric pain                           R93.3, Abnormal findings on diagnostic imaging of                            other parts of digestive tract CPT copyright 2018 [employer]. All rights reserved. The codes documented in this report are preliminary and upon coder review may  be revised to meet current compliance  requirements. Arta Silence, MD 07/05/2018 9:56:33 AM This report has been signed electronically. Number of Addenda: 0

## 2018-07-06 ENCOUNTER — Encounter: Payer: Self-pay | Admitting: Sports Medicine

## 2018-07-07 ENCOUNTER — Inpatient Hospital Stay: Payer: 59 | Attending: Hematology and Oncology | Admitting: Hematology and Oncology

## 2018-07-07 ENCOUNTER — Telehealth: Payer: Self-pay

## 2018-07-07 ENCOUNTER — Encounter: Payer: Self-pay | Admitting: Hematology and Oncology

## 2018-07-07 DIAGNOSIS — R109 Unspecified abdominal pain: Secondary | ICD-10-CM | POA: Insufficient documentation

## 2018-07-07 DIAGNOSIS — Z9221 Personal history of antineoplastic chemotherapy: Secondary | ICD-10-CM | POA: Diagnosis not present

## 2018-07-07 DIAGNOSIS — Z79899 Other long term (current) drug therapy: Secondary | ICD-10-CM | POA: Diagnosis not present

## 2018-07-07 DIAGNOSIS — C259 Malignant neoplasm of pancreas, unspecified: Secondary | ICD-10-CM | POA: Insufficient documentation

## 2018-07-07 DIAGNOSIS — R112 Nausea with vomiting, unspecified: Secondary | ICD-10-CM | POA: Diagnosis not present

## 2018-07-07 DIAGNOSIS — Z800 Family history of malignant neoplasm of digestive organs: Secondary | ICD-10-CM | POA: Insufficient documentation

## 2018-07-07 DIAGNOSIS — K8500 Idiopathic acute pancreatitis without necrosis or infection: Secondary | ICD-10-CM

## 2018-07-07 DIAGNOSIS — Z7189 Other specified counseling: Secondary | ICD-10-CM | POA: Insufficient documentation

## 2018-07-07 DIAGNOSIS — K859 Acute pancreatitis without necrosis or infection, unspecified: Secondary | ICD-10-CM | POA: Insufficient documentation

## 2018-07-07 DIAGNOSIS — C250 Malignant neoplasm of head of pancreas: Secondary | ICD-10-CM | POA: Insufficient documentation

## 2018-07-07 NOTE — Assessment & Plan Note (Signed)
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 She has appointment set up on July 12, 2018 with multidisciplinary clinic. We discussed the logistics of surgical resection followed by adjuvant chemotherapy She does not have any high risk features at present time and hence I do not believe neoadjuvant chemotherapy approach is indicated at this point in time. We discussed the current guidelines and brief discussion about potential side effects of treatment.  I did not go into great details with the patient and family members until she undergo surgical resection first.  She will call me after her consultation for further plan of care.

## 2018-07-07 NOTE — Assessment & Plan Note (Signed)
We discussed the staging of her disease.  Due to small size of disease less than 2 cm without lymphadenopathy or metastatic disease, she is at early stage I disease. The intent of treatment is curative with primary resection followed by adjuvant treatment.

## 2018-07-07 NOTE — Progress Notes (Signed)
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 She has appointment set up on July 12, 2018 with multidisciplinary clinic. We discussed the logistics of surgical resection followed by adjuvant chemotherapy She does not have any high risk features at present time and hence I do not believe neoadjuvant chemotherapy approach is indicated at this point in time. We discussed the current guidelines and brief discussion about potential side effects of treatment.  I did not go into great details with the patient and family members until she undergo surgical resection first.  She will call me after her consultation for further plan of care.  Acute pancreatitis This is resolving.  I recommend her to slowly advance diet as tolerated  Abdominal pain This is improving.  She has prescription pain medicine to take as needed  Nausea and vomiting She denies recurrent nausea and vomiting since hospital discharge.  She will take antiemetics as needed  Goals of care, counseling/discussion We discussed the staging of her disease.  Due to small size of disease less than 2 cm without lymphadenopathy or metastatic disease, she is at early stage I disease. The intent of treatment is curative with primary resection followed by adjuvant treatment.   No orders of the defined types were placed in this encounter.   INTERVAL HISTORY: Please see below for problem oriented charting. Please see my hospital consult note dated July 05, 2018 for further details. The patient was admitted to  the hospital recently due to severe lower back pain and was diagnosed with acute pancreatitis.  She underwent further imaging study which detected abnormal lesion in the pancreas.  She underwent further imaging and biopsy which confirmed adenocarcinoma of the pancreas. She returns today for further follow-up and discussion about plan of care. Since last time I saw her, she felt better.  Her pain is minimum.  She does not need to take frequent pain medicine. She denies further nausea.  She is able to hydrate herself adequately.  SUMMARY OF ONCOLOGIC HISTORY:   Pancreatic adenocarcinoma ([place])   07/03/2018 Imaging    MRCP 1. Abrupt caliber change of the pancreatic duct in the mid pancreas associated with an subtle ovoid lesion. Proximal to this mid pancreatic lesion the duct is significantly dilated and distally the duct is normal. Concern for pancreatic neoplasm. Recommend endoscopic ultrasound with tissue sampling.  2. Enhancing lesion in the subcapsular LEFT lateral hepatic lobe is indeterminate but favored small focal nodular hyperplasia or other benign lesion. Metastatic disease is not favored. Additional benign hepatic cysts within the liver.  3.   no evidence of metastatic adenopathy    07/03/2018 Imaging    Ct angiogram of chest and abdominal vessels: Chest CTA impression:  1. No acute cardiopulmonary disease. Specifically, no evidence of thoracic aortic aneurysm or dissection. No evidence of central pulmonary embolism.  Abdomen pelvis CTA impression:  1. No acute findings within the abdomen or pelvis. Specifically, no evidence of abdominal aortic aneurysm, dissection or mesenteric ischemia. 2. Pancreatic ductal dilatation with questioned approximately 6.0 cm hypoattenuating mass involving the proximal body of pancreas, incompletely evaluated on this arterial phase examination. Further evaluation with MRCP is  advised. 3. Indeterminate approximately 1.2 cm hyperenhancing lesion  within the subcapsular aspect of the left lobe of the liver, potentially representative of a perfusion anomaly or flash filling hemangioma, though incompletely characterized on the present examination. This lesion may also be further evaluated at the time of the recommended MRCP.    07/03/2018 Tumor Marker    Patient's tumor was tested for the following markers: CA-199 Results of the tumor marker test revealed 19    07/03/2018 - 07/05/2018 Hospital Admission    She was admitted to the hospital due to acute lower back pain and was diagnosed with acute pancreatitis.  Further evaluation detected abnormal lesion in the pancreas.  Further EUS and biopsy confirmed adenocarcinoma of the head of the pancreas    07/05/2018 Pathology Results    FINE NEEDLE ASPIRATION, ENDOSCOPIC, PANCREAS BODY(SPECIMEN 1 OF 1 COLLECTED 07/05/18): MALIGNANT CELLS PRESENT, CONSISTENT WITH ADENOCARCINOMA.  THERE IS LIKELY INSUFFICIENT MATERIAL FOR ADDITIONAL STUDIES    07/05/2018 Procedure    EUS report No lymphadenopathy seen. There was no sign of significant endosonographic abnormality in the left lobe of the liver. No focal pathology was identified. There was no sign of significant endosonographic abnormality in the common bile duct. An unremarkable gallbladder was identified. A round mass was identified in the pancreatic body. The mass was isoechoic. The mass measured 15 mm by 19 mm in maximal cross-sectional diameter. The outer margins were smooth. An intact interface was seen between the mass and the superior mesenteric artery and celiac artery, suggesting a lack of invasion. No obvious venous vascular involvement. The remainder of the pancreas was examined. The endosonographic appearance of parenchyma and the upstream pancreatic duct indicated duct dilation.  Fine needle aspiration for cytology was performed. Color Doppler imaging was utilized prior to needle puncture to confirm a lack of significant vascular structures  within the needle path. Four passes were made with the 25 gauge needle using a transgastric approach. A stylet was used. A cytotechnologist was present to evaluate the adequacy of the specimen. The cellularity of the specimen was adequate. Final cytology results are pending. Estimated blood loss: none.  Summary A round mass was identified in the pancreatic body. The mass was isoechoic. The mass measured 15 mm by 19 mm in maximal cross-sectional diameter. The outer margins were smooth. An intact interface was seen between the mass and the superior mesenteric artery and celiac artery, suggesting a lack of invasion. No obvious venous vascular involvement. The remainder of the pancreas was examined. The endosonographic appearance of parenchyma and the upstream pancreatic duct indicated duct dilation.  Fine needle aspiration for cytology was performed. Color Doppler imaging was utilized prior to needle puncture to confirm a lack of significant vascular structures within the needle path. Four passes were made with the 25 gauge needle using a transgastric approach. A stylet was used. A cytotechnologist was present to evaluate the adequacy of the specimen. The cellularity of the specimen was adequate. Final cytology results are pending. Estimated blood loss: none.  Impression - There was no evidence of significant pathology in the left lobe of the liver. - There was no sign of significant pathology in the common bile duct. - A mass was identified in the pancreatic body. Fine needle aspiration performed.    07/07/2018 Cancer Staging    Staging form: Exocrine Pancreas, AJCC 8th Edition - Clinical stage from 07/07/2018: Stage IA (cT1c, cN0, cM0) - Signed by Heath Lark, MD on 07/07/2018     REVIEW OF SYSTEMS:  Constitutional: Denies fevers, chills or abnormal weight loss Eyes: Denies blurriness of vision Ears, nose, mouth, throat, and face: Denies mucositis or sore throat Respiratory: Denies cough, dyspnea  or wheezes Cardiovascular: Denies palpitation, chest discomfort or lower extremity swelling Skin: Denies abnormal skin rashes Lymphatics: Denies new lymphadenopathy or easy bruising Neurological:Denies numbness, tingling or new weaknesses Behavioral/Psych: Mood is stable, no new changes  All other systems were reviewed with the patient and are negative.  I have reviewed the past medical history, past surgical history, social history and family history with the patient and they are unchanged from previous note.  ALLERGIES:  has No Known Allergies.  MEDICATIONS:  Current Outpatient Medications  Medication Sig Dispense Refill  . ALPRAZolam (XANAX) 0.25 MG tablet Take 1 tablet (0.25 mg total) by mouth 2 (two) times daily as needed for anxiety or sleep. 30 tablet 0  . bismuth subsalicylate (PEPTO BISMOL) 262 MG chewable tablet Chew 524 mg by mouth as needed for indigestion or diarrhea or loose stools.    Marland Kitchen ibuprofen (ADVIL,MOTRIN) 200 MG tablet Take 200 mg by mouth every 6 (six) hours as needed for moderate pain.    Marland Kitchen OVER THE COUNTER MEDICATION Take 1 tablet by mouth daily. PB8 Probiotic    . OVER THE COUNTER MEDICATION Take 1 tablet by mouth daily. Thermo green tea    . promethazine (PHENERGAN) 25 MG tablet Take 1 tablet (25 mg total) by mouth every 6 (six) hours as needed for nausea. 30 tablet 0  . zolpidem (AMBIEN) 5 MG tablet Take 1 tablet (5 mg total) by mouth at bedtime as needed for sleep. 30 tablet 0   No current facility-administered medications for this visit.     PHYSICAL EXAMINATION: ECOG PERFORMANCE STATUS: 1 - Symptomatic but completely ambulatory  Vitals:   07/07/18 1518  BP: 132/69  Pulse: 65  Resp: 17  Temp: 98.1 F (36.7 C)  SpO2: 99%   Filed Weights   07/07/18 1518  Weight: 124 lb 3.2 oz (56.3 kg)    GENERAL:alert, no distress and comfortable Musculoskeletal:no cyanosis of digits and no clubbing  NEURO: alert & oriented x 3 with fluent speech, no focal  motor/sensory deficits  LABORATORY DATA:  I have reviewed the data as listed    Component Value Date/Time   NA 140 07/05/2018 0435   K 3.7 07/05/2018 0435   CL 108 07/05/2018 0435   CO2 22 07/05/2018 0435   GLUCOSE 88 07/05/2018 0435   BUN 8 07/05/2018 0435   CREATININE 0.62 07/05/2018 0435   CALCIUM 8.2 (L) 07/05/2018 0435   PROT 5.6 (L) 07/05/2018 0435   ALBUMIN 3.4 (L) 07/05/2018 0435   AST 21 07/05/2018 0435   ALT 13 07/05/2018 0435   ALKPHOS 44 07/05/2018 0435   BILITOT 0.9 07/05/2018 0435   GFRNONAA >60 07/05/2018 0435   GFRAA >60 07/05/2018 0435    No results found for: SPEP, UPEP  Lab Results  Component Value Date   WBC 3.8 (L) 07/05/2018   NEUTROABS 7.4 07/03/2018   HGB 11.3 (L) 07/05/2018   HCT 34.6 (L) 07/05/2018   MCV 100.6 (H) 07/05/2018   PLT 178 07/05/2018      Chemistry      Component Value Date/Time   NA 140 07/05/2018 0435   K 3.7 07/05/2018 0435   CL 108 07/05/2018 0435   CO2 22 07/05/2018 0435   BUN 8 07/05/2018 0435   CREATININE 0.62 07/05/2018 0435      Component Value Date/Time  CALCIUM 8.2 (L) 07/05/2018 0435   ALKPHOS 44 07/05/2018 0435   AST 21 07/05/2018 0435   ALT 13 07/05/2018 0435   BILITOT 0.9 07/05/2018 0435       RADIOGRAPHIC STUDIES: I have personally reviewed the radiological images as listed and agreed with the findings in the report. Dg Cervical Spine Complete  Result Date: 06/27/2018 CLINICAL DATA:  Neck pain, no injury EXAM: CERVICAL SPINE - COMPLETE 4+ VIEW COMPARISON:  None. FINDINGS: The cervical vertebrae are somewhat straightened in alignment. There is degenerative disc disease diffusely from C3-C7 with loss of disc space and sclerosis with spurring present. No acute fracture is seen and no prevertebral soft tissue swelling is noted. On oblique views, there is some foraminal narrowing at C4-5, C5-6, and C6-7 levels bilaterally. The odontoid process is intact. The lung apices appear clear. IMPRESSION: Straightened  alignment with diffuse degenerative disc disease from C3-C7. Foraminal narrowing is noted at C4-5, C5-6, and C6-7 levels. Electronically Signed   By: Ivar Drape M.D.   On: 06/27/2018 11:25   Dg Chest Port 1 View  Result Date: 07/03/2018 CLINICAL DATA:  Chest pain.  Nausea.  Chills. EXAM: PORTABLE CHEST 1 VIEW COMPARISON:  None. FINDINGS: Upper normal heart size. Vascular congestion. No consolidation or pulmonary edema. No pneumothorax or pleural effusion. IMPRESSION: Vascular congestion without pulmonary edema. Electronically Signed   By: Marybelle Killings M.D.   On: 07/03/2018 10:10   Mr Abdomen Mrcp Moise Boring Contast  Result Date: 07/04/2018 CLINICAL DATA:  Indeterminate pancreatic lesion. Pancreatic duct dilatation. Epigastric pain. EXAM: MRI ABDOMEN WITHOUT AND WITH CONTRAST (INCLUDING MRCP) TECHNIQUE: Multiplanar multisequence MR imaging of the abdomen was performed both before and after the administration of intravenous contrast. Heavily T2-weighted images of the biliary and pancreatic ducts were obtained, and three-dimensional MRCP images were rendered by post processing. CONTRAST:  7 mL Gadavist COMPARISON:  CT 07/03/2000 FINDINGS: Lower chest: Lung bases are clear. Hepatobiliary: In lateral segment of the LEFT hepatic lobe there is a nonenhancing cyst measuring 21 mm. Several smaller central cysts within liver. Along the anterior sub capsular LEFT lateral hepatic lobe there is a small early enhancing lesion measuring 8 mm (image 1 51/1401). This lesion is not identified on the T2 weighted series and is isointense on the more delayed vascular series. This corresponds the hyperenhancing lesion on comparison CT. Pancreas: There is an ovoid lesion in the mid pancreas measuring 20 mm x 15 mm which is hyperintense on T2 weighted imaging (image 24/5). Proximal to this lesion there is a significant ductal dilatation with the main pancreatic duct measuring up to 6 mm. This ductal dilatation extends to the pancreatic  tail. Distal to this lesion the pancreatic duct is normal caliber. On the precontrast T1 weighted imaging lesion is more difficult to define but appears hypointense (series 10/2). Lesion not readily identified on the post-contrast imaging. Head and uncinate the pancreas are normal. Spleen: Normal spleen Adrenals/urinary tract: Adrenal glands and kidneys are normal. Stomach/Bowel: Stomach limited view of the bowel is unremarkable. Vascular/Lymphatic: No upper abdominal adenopathy. Reproductive: Other: No free fluid. Musculoskeletal: No aggressive osseous lesion. IMPRESSION: 1. Abrupt caliber change of the pancreatic duct in the mid pancreas associated with an subtle ovoid lesion. Proximal to this mid pancreatic lesion the duct is significantly dilated and distally the duct is normal. Concern for pancreatic neoplasm. Recommend endoscopic ultrasound with tissue sampling. 2. Enhancing lesion in the subcapsular LEFT lateral hepatic lobe is indeterminate but favored small focal nodular hyperplasia or other  benign lesion. Metastatic disease is not favored. Additional benign hepatic cysts within the liver. 3.   no evidence of metastatic adenopathy Electronically Signed   By: Suzy Bouchard M.D.   On: 07/04/2018 08:53   Ct Angio Chest/abd/pel For Dissection W And/or Wo Contrast  Result Date: 07/03/2018 CLINICAL DATA:  Acute chest and abdominal pain this morning. Evaluate for dissection. EXAM: CT ANGIOGRAPHY CHEST, ABDOMEN AND PELVIS TECHNIQUE: Multidetector CT imaging through the chest, abdomen and pelvis was performed using the standard protocol during bolus administration of intravenous contrast. Multiplanar reconstructed images and MIPs were obtained and reviewed to evaluate the vascular anatomy. CONTRAST:  100 cc Isovue-300 COMPARISON:  None. FINDINGS: CTA CHEST FINDINGS Vascular Findings: No evidence of thoracic aortic aneurysm or dissection on this nongated examination. Review of the precontrast images are  negative for the presence of an intramural hematoma. No definitive perivascular stranding. Conventional configuration of the aortic arch. The branch vessels of the aortic arch appear widely patent throughout their imaged courses. Normal heart size.  No pericardial effusion. Although this examination was not tailored for the evaluation the pulmonary arteries, there are no discrete filling defects within the central pulmonary arterial tree to suggest central pulmonary embolism. Normal caliber of the main pulmonary artery. ------------------------------------------------------------- Thoracic aortic measurements: Sinotubular junction 27 mm as measured in greatest oblique coronal dimension. Proximal ascending aorta 30 mm as measured in greatest oblique axial dimension at the level of the main pulmonary artery. Aortic arch aorta 25 mm as measured in greatest oblique sagittal dimension. Proximal descending thoracic aorta 23 mm as measured in greatest oblique axial dimension at the level of the main pulmonary artery. Distal descending thoracic aorta 22 mm as measured in greatest oblique axial dimension at the level of the diaphragmatic hiatus. Review of the MIP images confirms the above findings. ------------------------------------------------------------- Non-Vascular Findings: Mediastinum/Lymph Nodes: No bulky mediastinal, hilar or axillary lymphadenopathy. Lungs/Pleura: Minimal dependent subpleural ground-glass atelectasis. No discrete focal airspace opacities. No pleural effusion or pneumothorax. The central pulmonary airways appear widely patent. No discrete pulmonary nodules. Musculoskeletal: No acute or aggressive osseous abnormalities. Post bilateral breast augmentation. Punctate (sub 3 mm) hypoattenuating right-sided thyroid nodules/cysts. _________________________________________________________ _________________________________________________________ CTA ABDOMEN AND PELVIS FINDINGS VASCULAR Aorta: Normal  caliber of the abdominal aorta. There is no significant atherosclerotic plaque within the abdominal aorta. No abdominal aortic dissection or periaortic stranding. Celiac: There is mild tortuosity involving the origin of the celiac artery, not resulting in hemodynamically significant stenosis. Conventional branching pattern. SMA: Widely patent without a hemodynamically significant narrowing. Conventional branching pattern. Renals: Solitary bilaterally; the bilateral renal arteries are widely patent without hemodynamically significant narrowing. No vessel irregularity to suggest FMD. IMA: Widely patent without hemodynamically significant narrowing. Inflow: The bilateral common, external and internal iliac arteries are of normal caliber and widely patent without hemodynamically significant narrowing. Veins: The IVC and pelvic venous system appear widely patent on this arterial phase examination. Review of the MIP images confirms the above findings. _________________________________________________________ NON-VASCULAR Evaluation the abdominal organs is limited to the arterial phase of enhancement. Hepatobiliary: Normal hepatic contour. There is an approximately 1.3 cm hypoattenuating (15 Hounsfield unit) cyst within the dome of the left lobe of the liver. There is an approximately 1.2 x 0.9 cm hyperenhancing lesion within the subcapsular aspect of the left lobe of the liver (image 294, series 8; coronal image 43, series 11, incompletely evaluated on the present examination. Normal appearance of the gallbladder given degree distention. No radiopaque gallstones. No intra or extrahepatic biliary duct dilatation. No  ascites. Pancreas: Pancreatic duct appears dilated at the level of the proximal body measuring approximately 1.3 x 1.2 cm (axial image 108, series 8; coronal image 51, series 11) with associated dilatation of the downstream pancreatic duct measuring at least 0.5 cm (image 113, series 8). There is a potential  hypoattenuating mass involving the proximal body of the pancreas measuring approximately 6.0 x 2.5 x 3.4 cm (image 113, series 8; coronal image 40, series 11), suboptimally evaluated secondary to arterial phase of enhancement. No definitive downstream pancreatic atrophy. No peripancreatic stranding. The adjacent vasculature appears patent. Spleen: Normal arterial phase appearance of the spleen. Adrenals/Urinary Tract: There is symmetric enhancement the bilateral kidneys. No definite renal stones this postcontrast examination. No discrete renal lesions. No urine obstruction or perinephric stranding. Normal appearance the bilateral adrenal glands. Normal appearance of the urinary bladder given degree distention. Stomach/Bowel: Nonobstructive bowel gas pattern. The bowel is normal in course and caliber without discrete area of wall thickening. Normal appearance of the terminal ileum. The appendix is not visualized, however there is no pericecal inflammatory change. No pneumoperitoneum, pneumatosis or portal venous gas. Lymphatic: No bulky retroperitoneal, mesenteric, pelvic or inguinal lymphadenopathy. Reproductive: Normal appearance of the pelvic organs. There is a small amount of presumably physiologic free fluid in the pelvic cul-de-sac. No discrete adnexal lesion. Other: Regional soft tissues appear normal. Musculoskeletal: No acute or aggressive osseous abnormalities. Review of the MIP images confirms the above findings. IMPRESSION: Chest CTA impression: 1. No acute cardiopulmonary disease. Specifically, no evidence of thoracic aortic aneurysm or dissection. No evidence of central pulmonary embolism. Abdomen pelvis CTA impression: 1. No acute findings within the abdomen or pelvis. Specifically, no evidence of abdominal aortic aneurysm, dissection or mesenteric ischemia. 2. Pancreatic ductal dilatation with questioned approximately 6.0 cm hypoattenuating mass involving the proximal body of pancreas, incompletely  evaluated on this arterial phase examination. Further evaluation with MRCP is advised. 3. Indeterminate approximately 1.2 cm hyperenhancing lesion within the subcapsular aspect of the left lobe of the liver, potentially representative of a perfusion anomaly or flash filling hemangioma, though incompletely characterized on the present examination. This lesion may also be further evaluated at the time of the recommended MRCP. Critical Value/emergent results were called by telephone at the time of interpretation on 07/03/2018 at 11:54 am to Dr. Dene Gentry , who verbally acknowledged these results. Electronically Signed   By: Sandi Mariscal M.D.   On: 07/03/2018 11:57    All questions were answered. The patient knows to call the clinic with any problems, questions or concerns. No barriers to learning was detected.  I spent 40 minutes counseling the patient face to face. The total time spent in the appointment was 55 minutes and more than 50% was on counseling and review of test results  Heath Lark, MD 07/07/2018 6:04 PM

## 2018-07-07 NOTE — Assessment & Plan Note (Signed)
This is improving.  She has prescription pain medicine to take as needed

## 2018-07-07 NOTE — Telephone Encounter (Signed)
Sister called and left a message. Requesting CD with imaging to take appt in [place] on Tuesday. Requesting pathology slides to take to appt on Tuesday.  Called sister back. Imaging CD will be ready at today's appt. Given fax number for pathology to be given to MD office in [place] to fax over request for slides. Sister verbalized understanding.

## 2018-07-07 NOTE — Assessment & Plan Note (Signed)
This is resolving.  I recommend her to slowly advance diet as tolerated

## 2018-07-07 NOTE — Assessment & Plan Note (Signed)
She denies recurrent nausea and vomiting since hospital discharge.  She will take antiemetics as needed

## 2018-07-10 ENCOUNTER — Encounter (HOSPITAL_COMMUNITY): Payer: Self-pay | Admitting: Gastroenterology

## 2018-07-10 ENCOUNTER — Telehealth: Payer: Self-pay

## 2018-07-10 NOTE — Telephone Encounter (Signed)
Faxed labs, hospital record and office note to [phone] at Dr. Brayton Layman in [place], [place]. Office # [phone].Marland Kitchen

## 2018-07-12 ENCOUNTER — Telehealth: Payer: Self-pay

## 2018-07-12 NOTE — Telephone Encounter (Signed)
Sister called and left message asking that referral be sent to [facility] to Dr. Elizabeth Palau. She has a tentative appt 1/24 at 8 am.  Faxed referral to the attention to [facility] [phone], received confirmation. Called and left a message for Mechele Claude intake person at [phone] that fax was sent and to call the office if anything else was needed.  Called sister back and left message that the above was sent and to call the office if needed.

## 2018-07-18 NOTE — Progress Notes (Signed)
FMLA successfully faxed to [employer] at [phone]. Mailed copy to patient address on file. 

## 2018-08-10 ENCOUNTER — Telehealth: Payer: Self-pay

## 2018-08-10 ENCOUNTER — Telehealth: Payer: Self-pay | Admitting: Hematology and Oncology

## 2018-08-10 NOTE — Telephone Encounter (Signed)
She called and left a message requesting appt. She had her surgery in [place] and now would like to talk about chemo.

## 2018-08-10 NOTE — Telephone Encounter (Signed)
Called patient per VM scheduling log.  Transferred the call to the nurse because the patient did not want me to call her back, in regards to scheduling her an appt.

## 2018-08-11 ENCOUNTER — Telehealth: Payer: Self-pay | Admitting: Hematology and Oncology

## 2018-08-11 ENCOUNTER — Encounter: Payer: Self-pay | Admitting: Hematology and Oncology

## 2018-08-11 ENCOUNTER — Inpatient Hospital Stay: Payer: 59 | Attending: Hematology and Oncology | Admitting: Hematology and Oncology

## 2018-08-11 DIAGNOSIS — Z791 Long term (current) use of non-steroidal anti-inflammatories (NSAID): Secondary | ICD-10-CM | POA: Diagnosis not present

## 2018-08-11 DIAGNOSIS — Z79899 Other long term (current) drug therapy: Secondary | ICD-10-CM

## 2018-08-11 DIAGNOSIS — R5381 Other malaise: Secondary | ICD-10-CM | POA: Diagnosis not present

## 2018-08-11 DIAGNOSIS — R634 Abnormal weight loss: Secondary | ICD-10-CM | POA: Insufficient documentation

## 2018-08-11 DIAGNOSIS — C250 Malignant neoplasm of head of pancreas: Secondary | ICD-10-CM

## 2018-08-11 DIAGNOSIS — C259 Malignant neoplasm of pancreas, unspecified: Secondary | ICD-10-CM

## 2018-08-11 NOTE — Assessment & Plan Note (Signed)
I have reviewed outside records She will be flying to [place] this weekend for follow-up with her surgeon in regards to the cyst seen after surgery Overall, she is healing well. She will be referred to [facility] for adjuvant treatment She would like to continue seeing me locally for supportive care We discussed in great detail the expected risk, benefits, side effects of adjuvant FOLFIRINOX, including risk of pancytopenia, infection, mucositis, nausea, diarrhea, need for transfusion support, alopecia, neuropathy and others I am willing to provide aggressive supportive care for her locally while she is receiving treatment at [facility] We discussed the use of Dignicap for hair preservation. I have given her patient education handout and she will purchase starting kit from the company in the near future We also discussed potential pump disconnect here locally along with G-CSF support She will call me with update next week after her appointment at [place].

## 2018-08-11 NOTE — Assessment & Plan Note (Signed)
She has lost some weight since last time I saw her. We discussed nutritional supplement as tolerated and activity as tolerated.

## 2018-08-11 NOTE — Assessment & Plan Note (Signed)
I have noted some restriction of movement today after surgery When she returns next week, I will send referral to physical therapy and rehab

## 2018-08-11 NOTE — Telephone Encounter (Signed)
I sent scheduling msg to see her today at 145 pm

## 2018-08-11 NOTE — Progress Notes (Signed)
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 She will be referred to [facility] for adjuvant treatment She would like to continue seeing me locally for supportive care We discussed in great detail the expected risk, benefits, side effects of adjuvant FOLFIRINOX, including risk of pancytopenia, infection, mucositis, nausea, diarrhea, need for transfusion support, alopecia, neuropathy and others I am willing to provide aggressive supportive care for her locally while she is receiving treatment at [facility] We discussed the use of Dignicap for hair preservation. I have given her patient education handout and she will purchase starting kit from the company in the near future We also discussed potential pump disconnect here locally along with G-CSF support She will call me with update next week after her appointment at [place].   Physical debility I have noted some restriction of movement today after surgery When she returns next week, I will send referral to physical therapy and rehab  Weight loss She has lost some weight since last time I saw her. We discussed nutritional supplement as tolerated and activity as tolerated.   No orders of the defined types were placed in this encounter.   INTERVAL HISTORY: Please see below for problem oriented charting. She returns with her husband and sister for further follow-up She has successful surgery in [place] 3 weeks ago. Her postoperative course was complicated by possible pseudocyst requiring internal drainage She has appointment for follow-up next week at [facility] Her surgeon  in [place] is recommending adjuvant chemotherapy at [facility] She is concerned about potential side effects Currently, she has minor pain in the incision site but this not required pain medicine She is only taking over-the-counter analgesics She has lost some weight since last time I saw her She also noted some restriction of movement and felt tight in her chest after surgery  She brought copies of her report which I have summarized as follows SUMMARY OF ONCOLOGIC HISTORY:   Pancreatic adenocarcinoma ([place])   07/03/2018 Imaging    MRCP 1. Abrupt caliber change of the pancreatic duct in the mid pancreas associated with an subtle ovoid lesion. Proximal to this mid pancreatic lesion the duct is significantly dilated and distally the duct is normal. Concern for pancreatic neoplasm. Recommend endoscopic ultrasound with tissue sampling.  2. Enhancing lesion in the subcapsular LEFT lateral hepatic lobe is indeterminate but favored small focal nodular hyperplasia or other benign lesion. Metastatic disease is not favored. Additional benign hepatic cysts within the liver.  3.   no evidence of metastatic adenopathy    07/03/2018 Imaging    Ct angiogram of chest and abdominal vessels: Chest CTA impression:  1. No acute cardiopulmonary disease. Specifically, no evidence of thoracic aortic aneurysm or dissection. No evidence of central pulmonary embolism.  Abdomen pelvis CTA impression:  1. No acute findings within the abdomen or pelvis. Specifically, no evidence of abdominal aortic aneurysm, dissection or mesenteric ischemia. 2. Pancreatic ductal dilatation with questioned approximately 6.0 cm hypoattenuating mass involving the proximal body of pancreas, incompletely evaluated on this arterial phase examination. Further evaluation with MRCP is advised. 3. Indeterminate approximately 1.2 cm hyperenhancing lesion within the subcapsular aspect  of the left lobe of the liver, potentially representative  of a perfusion anomaly or flash filling hemangioma, though incompletely characterized on the present examination. This lesion may also be further evaluated at the time of the recommended MRCP.    07/03/2018 Tumor Marker    Patient's tumor was tested for the following markers: CA-199 Results of the tumor marker test revealed 19    07/03/2018 - 07/05/2018 Hospital Admission    She was admitted to the hospital due to acute lower back pain and was diagnosed with acute pancreatitis.  Further evaluation detected abnormal lesion in the pancreas.  Further EUS and biopsy confirmed adenocarcinoma of the head of the pancreas    07/05/2018 Pathology Results    FINE NEEDLE ASPIRATION, ENDOSCOPIC, PANCREAS BODY(SPECIMEN 1 OF 1 COLLECTED 07/05/18): MALIGNANT CELLS PRESENT, CONSISTENT WITH ADENOCARCINOMA.  THERE IS LIKELY INSUFFICIENT MATERIAL FOR ADDITIONAL STUDIES    07/05/2018 Procedure    EUS report No lymphadenopathy seen. There was no sign of significant endosonographic abnormality in the left lobe of the liver. No focal pathology was identified. There was no sign of significant endosonographic abnormality in the common bile duct. An unremarkable gallbladder was identified. A round mass was identified in the pancreatic body. The mass was isoechoic. The mass measured 15 mm by 19 mm in maximal cross-sectional diameter. The outer margins were smooth. An intact interface was seen between the mass and the superior mesenteric artery and celiac artery, suggesting a lack of invasion. No obvious venous vascular involvement. The remainder of the pancreas was examined. The endosonographic appearance of parenchyma and the upstream pancreatic duct indicated duct dilation.  Fine needle aspiration for cytology was performed. Color Doppler imaging was utilized prior to needle puncture to confirm a lack of significant vascular structures within the needle path. Four passes were made with the 25 gauge needle using a  transgastric approach. A stylet was used. A cytotechnologist was present to evaluate the adequacy of the specimen. The cellularity of the specimen was adequate. Final cytology results are pending. Estimated blood loss: none.  Summary A round mass was identified in the pancreatic body. The mass was isoechoic. The mass measured 15 mm by 19 mm in maximal cross-sectional diameter. The outer margins were smooth. An intact interface was seen between the mass and the superior mesenteric artery and celiac artery, suggesting a lack of invasion. No obvious venous vascular involvement. The remainder of the pancreas was examined. The endosonographic appearance of parenchyma and the upstream pancreatic duct indicated duct dilation.  Fine needle aspiration for cytology was performed. Color Doppler imaging was utilized prior to needle puncture to confirm a lack of significant vascular structures within the needle path. Four passes were made with the 25 gauge needle using a transgastric approach. A stylet was used. A cytotechnologist was present to evaluate the adequacy of the specimen. The cellularity of the specimen was adequate. Final cytology results are pending. Estimated blood loss: none.  Impression - There was no evidence of significant pathology in the left lobe of the liver. - There was no sign of significant pathology in the common bile duct. - A mass was identified in the pancreatic body. Fine needle aspiration performed.    07/07/2018 Cancer Staging    Staging form: Exocrine Pancreas, AJCC 8th Edition - Clinical stage from 07/07/2018: Stage IA (cT1c, cN0, cM0) - Signed by Heath Lark, MD on 07/07/2018    07/14/2018 Imaging    Ct scan of abdomen and pelvis Baseline Ct done in [place]  07/22/2018 Surgery    She had surgery in [facility] by Dr. Susette Racer  Procedure: Laparoscopic distal pancreatectomy, laparoscopic splenectomy, explorotomy celiotomy, biopsies and LN removal    07/22/2018  Pathology Results    Adenocarcinoma of pancreas with 80% mucinous component, moderately differentiated, located on the body of pancreas, 1.8 cm greatest dimension, negative margin. No lymphovascular invasion; perineural invasion seen. Closest margin 0.7 cm proximal margin None of 16 LN were involved    07/28/2018 Imaging    Ct scan of abdomen and pelvis in [place] Fluid collection noted near the resection area.    07/31/2018 Procedure    She underwent EUS which revealed a cystic lesion in the pancreatic tail. Cystogastrostomy was performed     REVIEW OF SYSTEMS:   Constitutional: Denies fevers, chills  Eyes: Denies blurriness of vision Ears, nose, mouth, throat, and face: Denies mucositis or sore throat Respiratory: Denies cough, dyspnea or wheezes Cardiovascular: Denies palpitation, chest discomfort or lower extremity swelling Gastrointestinal:  Denies nausea, heartburn or change in bowel habits Skin: Denies abnormal skin rashes Lymphatics: Denies new lymphadenopathy or easy bruising Neurological:Denies numbness, tingling or new weaknesses Behavioral/Psych: Mood is stable, no new changes  All other systems were reviewed with the patient and are negative.  I have reviewed the past medical history, past surgical history, social history and family history with the patient and they are unchanged from previous note.  ALLERGIES:  has No Known Allergies.  MEDICATIONS:  Current Outpatient Medications  Medication Sig Dispense Refill  . ALPRAZolam (XANAX) 0.25 MG tablet Take 1 tablet (0.25 mg total) by mouth 2 (two) times daily as needed for anxiety or sleep. 30 tablet 0  . bismuth subsalicylate (PEPTO BISMOL) 262 MG chewable tablet Chew 524 mg by mouth as needed for indigestion or diarrhea or loose stools.    Marland Kitchen ibuprofen (ADVIL,MOTRIN) 200 MG tablet Take 200 mg by mouth every 6 (six) hours as needed for moderate pain.    Marland Kitchen OVER THE COUNTER MEDICATION Take 1 tablet by mouth daily. PB8 Probiotic     . OVER THE COUNTER MEDICATION Take 1 tablet by mouth daily. Thermo green tea    . promethazine (PHENERGAN) 25 MG tablet Take 1 tablet (25 mg total) by mouth every 6 (six) hours as needed for nausea. 30 tablet 0  . zolpidem (AMBIEN) 5 MG tablet Take 1 tablet (5 mg total) by mouth at bedtime as needed for sleep. 30 tablet 0   No current facility-administered medications for this visit.     PHYSICAL EXAMINATION: ECOG PERFORMANCE STATUS: 1 - Symptomatic but completely ambulatory  Vitals:   08/11/18 1403  BP: (!) 107/51  Pulse: 68  Resp: 18  Temp: 98.2 F (36.8 C)  SpO2: 98%   Filed Weights   08/11/18 1403  Weight: 115 lb 12.8 oz (52.5 kg)    GENERAL:alert, no distress and comfortable.  She looks thinner from her previous exam SKIN: skin color, texture, turgor are normal, no rashes or significant lesions EYES: normal, Conjunctiva are pink and non-injected, sclera clear OROPHARYNX:no exudate, no erythema and lips, buccal mucosa, and tongue normal  NECK: supple, thyroid normal size, non-tender, without nodularity LYMPH:  no palpable lymphadenopathy in the cervical, axillary or inguinal LUNGS: clear to auscultation and percussion with normal breathing effort HEART: regular rate & rhythm and no murmurs and no lower extremity edema ABDOMEN:abdomen soft, non-tender and normal bowel sounds.  Well-healed surgical scar Musculoskeletal:no cyanosis of digits and no clubbing  NEURO: alert & oriented  x 3 with fluent speech, no focal motor/sensory deficits  LABORATORY DATA:  I have reviewed the data as listed    Component Value Date/Time   NA 140 07/05/2018 0435   K 3.7 07/05/2018 0435   CL 108 07/05/2018 0435   CO2 22 07/05/2018 0435   GLUCOSE 88 07/05/2018 0435   BUN 8 07/05/2018 0435   CREATININE 0.62 07/05/2018 0435   CALCIUM 8.2 (L) 07/05/2018 0435   PROT 5.6 (L) 07/05/2018 0435   ALBUMIN 3.4 (L) 07/05/2018 0435   AST 21 07/05/2018 0435   ALT 13 07/05/2018 0435   ALKPHOS 44  07/05/2018 0435   BILITOT 0.9 07/05/2018 0435   GFRNONAA >60 07/05/2018 0435   GFRAA >60 07/05/2018 0435    No results found for: SPEP, UPEP  Lab Results  Component Value Date   WBC 3.8 (L) 07/05/2018   NEUTROABS 7.4 07/03/2018   HGB 11.3 (L) 07/05/2018   HCT 34.6 (L) 07/05/2018   MCV 100.6 (H) 07/05/2018   PLT 178 07/05/2018      Chemistry      Component Value Date/Time   NA 140 07/05/2018 0435   K 3.7 07/05/2018 0435   CL 108 07/05/2018 0435   CO2 22 07/05/2018 0435   BUN 8 07/05/2018 0435   CREATININE 0.62 07/05/2018 0435      Component Value Date/Time   CALCIUM 8.2 (L) 07/05/2018 0435   ALKPHOS 44 07/05/2018 0435   AST 21 07/05/2018 0435   ALT 13 07/05/2018 0435   BILITOT 0.9 07/05/2018 0435      All questions were answered. The patient knows to call the clinic with any problems, questions or concerns. No barriers to learning was detected.  I spent 40 minutes counseling the patient face to face. The total time spent in the appointment was 55 minutes and more than 50% was on counseling and review of test results  Heath Lark, MD 08/11/2018 5:02 PM

## 2018-08-11 NOTE — Telephone Encounter (Signed)
Scheduled appt per 2/21 sch message - pt is aware of appt date and time   

## 2018-09-13 ENCOUNTER — Inpatient Hospital Stay: Payer: 59

## 2018-09-13 ENCOUNTER — Other Ambulatory Visit: Payer: Self-pay

## 2018-09-13 ENCOUNTER — Inpatient Hospital Stay: Payer: 59 | Attending: Hematology and Oncology | Admitting: Hematology and Oncology

## 2018-09-13 VITALS — BP 108/91 | HR 95 | Temp 98.6°F | Resp 18 | Ht 65.0 in | Wt 111.9 lb

## 2018-09-13 DIAGNOSIS — R5381 Other malaise: Secondary | ICD-10-CM | POA: Insufficient documentation

## 2018-09-13 DIAGNOSIS — R300 Dysuria: Secondary | ICD-10-CM

## 2018-09-13 DIAGNOSIS — C259 Malignant neoplasm of pancreas, unspecified: Secondary | ICD-10-CM

## 2018-09-13 DIAGNOSIS — Z79899 Other long term (current) drug therapy: Secondary | ICD-10-CM | POA: Insufficient documentation

## 2018-09-13 DIAGNOSIS — R110 Nausea: Secondary | ICD-10-CM | POA: Insufficient documentation

## 2018-09-13 DIAGNOSIS — R634 Abnormal weight loss: Secondary | ICD-10-CM

## 2018-09-13 DIAGNOSIS — R531 Weakness: Secondary | ICD-10-CM | POA: Insufficient documentation

## 2018-09-13 DIAGNOSIS — C250 Malignant neoplasm of head of pancreas: Secondary | ICD-10-CM | POA: Insufficient documentation

## 2018-09-13 DIAGNOSIS — T451X5A Adverse effect of antineoplastic and immunosuppressive drugs, initial encounter: Secondary | ICD-10-CM | POA: Diagnosis not present

## 2018-09-13 DIAGNOSIS — K2970 Gastritis, unspecified, without bleeding: Secondary | ICD-10-CM | POA: Insufficient documentation

## 2018-09-13 DIAGNOSIS — K2900 Acute gastritis without bleeding: Secondary | ICD-10-CM

## 2018-09-13 LAB — CBC WITH DIFFERENTIAL/PLATELET
Abs Immature Granulocytes: 0.04 10*3/uL (ref 0.00–0.07)
BASOS ABS: 0 10*3/uL (ref 0.0–0.1)
Basophils Relative: 0 %
EOS PCT: 8 %
Eosinophils Absolute: 0.7 10*3/uL — ABNORMAL HIGH (ref 0.0–0.5)
HEMATOCRIT: 40.5 % (ref 36.0–46.0)
HEMOGLOBIN: 13.5 g/dL (ref 12.0–15.0)
IMMATURE GRANULOCYTES: 1 %
LYMPHS ABS: 1.7 10*3/uL (ref 0.7–4.0)
LYMPHS PCT: 21 %
MCH: 32.1 pg (ref 26.0–34.0)
MCHC: 33.3 g/dL (ref 30.0–36.0)
MCV: 96.2 fL (ref 80.0–100.0)
Monocytes Absolute: 0.3 10*3/uL (ref 0.1–1.0)
Monocytes Relative: 4 %
NEUTROS PCT: 66 %
NRBC: 0 % (ref 0.0–0.2)
Neutro Abs: 5.3 10*3/uL (ref 1.7–7.7)
Platelets: 299 10*3/uL (ref 150–400)
RBC: 4.21 MIL/uL (ref 3.87–5.11)
RDW: 11.7 % (ref 11.5–15.5)
WBC: 8 10*3/uL (ref 4.0–10.5)

## 2018-09-13 LAB — COMPREHENSIVE METABOLIC PANEL
ALBUMIN: 3.7 g/dL (ref 3.5–5.0)
ALK PHOS: 79 U/L (ref 38–126)
ALT: 18 U/L (ref 0–44)
AST: 14 U/L — AB (ref 15–41)
Anion gap: 12 (ref 5–15)
BUN: 13 mg/dL (ref 6–20)
CHLORIDE: 99 mmol/L (ref 98–111)
CO2: 26 mmol/L (ref 22–32)
Calcium: 8.8 mg/dL — ABNORMAL LOW (ref 8.9–10.3)
Creatinine, Ser: 0.65 mg/dL (ref 0.44–1.00)
GFR calc Af Amer: >60 mL/min (ref >60)
GFR calc non Af Amer: >60 mL/min (ref >60)
Glucose, Bld: 103 mg/dL — ABNORMAL HIGH (ref 70–99)
POTASSIUM: 4.2 mmol/L (ref 3.5–5.1)
SODIUM: 137 mmol/L (ref 135–145)
Total Bilirubin: 0.9 mg/dL (ref 0.3–1.2)
Total Protein: 7.1 g/dL (ref 6.5–8.1)

## 2018-09-13 LAB — URINALYSIS, COMPLETE (UACMP) WITH MICROSCOPIC
Bilirubin Urine: NEGATIVE
GLUCOSE, UA: NEGATIVE mg/dL
Hgb urine dipstick: NEGATIVE
Ketones, ur: NEGATIVE mg/dL
Leukocytes,Ua: NEGATIVE
Nitrite: NEGATIVE
PH: 6 (ref 5.0–8.0)
Protein, ur: NEGATIVE mg/dL
Specific Gravity, Urine: 1.011 (ref 1.005–1.030)

## 2018-09-13 LAB — MAGNESIUM: Magnesium: 2 mg/dL (ref 1.7–2.4)

## 2018-09-13 MED ORDER — SODIUM CHLORIDE 0.9 % IV SOLN: Freq: Once | INTRAVENOUS | Status: DC

## 2018-09-13 MED ORDER — PANTOPRAZOLE SODIUM 40 MG PO TBEC: 40.0000 mg | DELAYED_RELEASE_TABLET | Freq: Every day | ORAL | 1 refills | Status: DC

## 2018-09-13 MED ORDER — ONDANSETRON HCL 4 MG/2ML IJ SOLN
INTRAMUSCULAR | Status: AC
  Filled 2018-09-13: qty 4

## 2018-09-13 MED ORDER — SODIUM CHLORIDE 0.9 % IV SOLN
Freq: Once | INTRAVENOUS | Status: AC
  Administered 2018-09-13: 10:00:00 via INTRAVENOUS
  Filled 2018-09-13: qty 250

## 2018-09-13 MED ORDER — ONDANSETRON HCL 4 MG/2ML IJ SOLN
8.0000 mg | Freq: Once | INTRAMUSCULAR | Status: AC
  Administered 2018-09-13: 8 mg via INTRAVENOUS

## 2018-09-13 NOTE — Patient Instructions (Signed)

## 2018-09-14 ENCOUNTER — Inpatient Hospital Stay: Payer: 59

## 2018-09-14 ENCOUNTER — Inpatient Hospital Stay (HOSPITAL_BASED_OUTPATIENT_CLINIC_OR_DEPARTMENT_OTHER): Payer: 59 | Admitting: Hematology and Oncology

## 2018-09-14 ENCOUNTER — Other Ambulatory Visit: Payer: Self-pay

## 2018-09-14 ENCOUNTER — Ambulatory Visit: Payer: 59 | Admitting: Podiatry

## 2018-09-14 ENCOUNTER — Encounter: Payer: Self-pay | Admitting: Hematology and Oncology

## 2018-09-14 VITALS — BP 109/68 | HR 88 | Temp 98.3°F | Resp 18

## 2018-09-14 DIAGNOSIS — C250 Malignant neoplasm of head of pancreas: Secondary | ICD-10-CM | POA: Diagnosis not present

## 2018-09-14 DIAGNOSIS — R110 Nausea: Secondary | ICD-10-CM

## 2018-09-14 DIAGNOSIS — T451X5A Adverse effect of antineoplastic and immunosuppressive drugs, initial encounter: Secondary | ICD-10-CM

## 2018-09-14 DIAGNOSIS — C259 Malignant neoplasm of pancreas, unspecified: Secondary | ICD-10-CM

## 2018-09-14 LAB — URINE CULTURE: Culture: NO GROWTH

## 2018-09-14 MED ORDER — SODIUM CHLORIDE 0.9 % IV SOLN: Freq: Once | INTRAVENOUS | Status: DC

## 2018-09-14 MED ORDER — SODIUM CHLORIDE 0.9% FLUSH
10.0000 mL | Freq: Once | INTRAVENOUS | Status: AC | PRN
  Administered 2018-09-14: 10 mL
  Filled 2018-09-14: qty 10

## 2018-09-14 MED ORDER — SODIUM CHLORIDE 0.9 % IV SOLN
Freq: Once | INTRAVENOUS | Status: AC
  Administered 2018-09-14: 15:00:00 via INTRAVENOUS
  Filled 2018-09-14: qty 250

## 2018-09-14 MED ORDER — ONDANSETRON HCL 4 MG/2ML IJ SOLN
INTRAMUSCULAR | Status: AC
  Filled 2018-09-14: qty 4

## 2018-09-14 MED ORDER — HEPARIN SOD (PORK) LOCK FLUSH 100 UNIT/ML IV SOLN
500.0000 [IU] | Freq: Once | INTRAVENOUS | Status: DC | PRN
  Filled 2018-09-14: qty 5

## 2018-09-14 MED ORDER — ONDANSETRON HCL 4 MG/2ML IJ SOLN
8.0000 mg | Freq: Once | INTRAMUSCULAR | Status: AC
  Administered 2018-09-14: 8 mg via INTRAVENOUS

## 2018-09-14 NOTE — Patient Instructions (Signed)

## 2018-09-14 NOTE — Progress Notes (Signed)
No entry 

## 2018-09-14 NOTE — Assessment & Plan Note (Signed)
She has recent weight loss due to poor oral intake She is able to tolerate oral intake better since IV fluid hydration and IV Zofran I recommend frequent small meals as tolerated

## 2018-09-14 NOTE — Progress Notes (Signed)
Unable to see location of port catheter tip in files under CE from [place], only that catheter tip is in stable position and ready for use.  Good blood return, no resistance or pain per pt.  Sorbaview dressing w/AM disc in place from last accessing on 09/13/2018.  Pt gave verbal consent to use her port for fluids and IV zofran at this time while waiting for [facility] to fax over radiographic study done after port placement on 09/04/2018.  Pt scheduled to receive IVF on 09/15/2018 and 09/16/2018 per pt request and MD Gorsuch VO/agreement.  Elmyra Ricks RN aware, sending scheduling messages.  Pt VU of plan to keep port accessed for next two IVF appts and plan of care.  Pt given instructions & VU of home port care.  Once fax is received note will be placed in pt's chart and study will be sent to be scanned in.  Received fax from [place], only "a radiograph confirmed proper catheter location" and "the port is ready for immediate use" found in documents.  RN Elmyra Ricks made aware, MD Parks Neptune.  Will saline lock pt's port for tomorrow's IVF infusion, home dressing still in place.  Pt made aware.  MD Alvy Bimler will decide if pt requires more scans to determine tip placement/further plan of care.  Pt given 1L IVF NS over 2 hours and IV zofran 8mg  IVP.  Tolerated well.  Pt drank ginger ale without issue, refused any food.  A&Ox4.  VSS.  Ambulatory w/steady gait to exit alone with belongings.  Pt VU of d/c instructions and to call as needed.

## 2018-09-14 NOTE — Assessment & Plan Note (Signed)
She has profound signs and symptoms of gastritis I recommend daily Protonix

## 2018-09-14 NOTE — Assessment & Plan Note (Signed)
The patient appears weak and debilitated When she recovers, I will consult home physical therapy for home-based therapy as tolerated Based on my evaluation, she should received IV fluids and IV Zofran at home as needed I will consult advanced home care service for this

## 2018-09-14 NOTE — Assessment & Plan Note (Signed)
She tolerated treatment very poorly with significant side effects of dizziness, chest pressure, constipation, gastritis, weakness, nausea and dehydration She will continue her treatment at [facility] as scheduled We will continue to provide supportive care as needed.

## 2018-09-14 NOTE — Progress Notes (Signed)
[place] OFFICE PROGRESS NOTE  Patient Care Team: Vernie Shanks, MD as PCP - General (Family Medicine) [place], [place] @ [place] [facility])  ASSESSMENT & PLAN:  Pancreatic adenocarcinoma [facility]) She tolerated treatment very poorly with significant side effects of dizziness, chest pressure, constipation, gastritis, weakness, nausea and dehydration She will continue her treatment at [facility] as scheduled We will continue to provide supportive care as needed.  Chemotherapy-induced nausea She has profound symptoms of nausea She was only taking Phenergan and not taking Zofran on a regular basis During the course of my evaluation, she has received 1 dose of intravenous Zofran along with 2 L of normal saline infusion Her symptoms had improved dramatically I recommend advanced home care service to deliver IV fluids daily as needed and IV Zofran as needed for nausea and vomiting She agreed with the plan of care I will check urinalysis and urine culture to exclude urinary tract infection  Gastritis She has profound signs and symptoms of gastritis I recommend daily Protonix  Weight loss She has recent weight loss due to poor oral intake She is able to tolerate oral intake better since IV fluid hydration and IV Zofran I recommend frequent small meals as tolerated  Physical debility The patient appears weak and debilitated When she recovers, I will consult home physical therapy for home-based therapy as tolerated Based on my evaluation, she should received IV fluids and IV Zofran at home as needed I will consult advanced home care service for this   Orders Placed This Encounter  Procedures  . Urine Culture    Standing Status:   Future    Number of Occurrences:   1    Standing Expiration Date:   10/18/2019  . CBC with Differential/Platelet    Standing Status:   Standing    Number of Occurrences:   11    Standing Expiration Date:   09/13/2019  .  Comprehensive metabolic panel    Standing Status:   Standing    Number of Occurrences:   11    Standing Expiration Date:   09/13/2019  . Magnesium    Standing Status:   Standing    Number of Occurrences:   11    Standing Expiration Date:   09/13/2019  . Urinalysis, Complete w Microscopic    Standing Status:   Future    Number of Occurrences:   1    Standing Expiration Date:   09/13/2019  . Ambulatory referral to Home Health    Referral Priority:   Routine    Referral Type:   Home Health Care    Referral Reason:   Specialty Services Required    Requested Specialty:   [place]    Number of Visits Requested:   1  . SCHEDULING COMMUNICATION    Please schedule 3 hour infusion visit for fluids and hydration    INTERVAL HISTORY: Please see below for problem oriented charting. She is seen urgently at her request due to severe side effects from recent chemotherapy Since last time I saw her, she was started on adjuvant chemotherapy at [facility] on 09/07/2018 Since then, she has profound decline in performance status With treatment, she developed significant dry mouth after atropine After that, she has profound chest pressure at home since infusion was completed She also have sensation of chest tightness that comes and goes, what sounds like heartburn/gastritis, nausea, poor oral intake and weakness She was not able to get out of bed  because of her profound weakness and poor oral intake She was seen in the outpatient clinic and received 2 L of IV fluids and IV Zofran over the course of 4 hours She denies fever or chills.  No recent cough Denies peripheral neuropathy from treatment She denies further signs and symptoms of chest pain, shortness of breath or chest pressure  SUMMARY OF ONCOLOGIC HISTORY:   Pancreatic adenocarcinoma ([place])   07/03/2018 Imaging    MRCP 1. Abrupt caliber change of the pancreatic duct in the mid pancreas associated with an subtle ovoid lesion.  Proximal to this mid pancreatic lesion the duct is significantly dilated and distally the duct is normal. Concern for pancreatic neoplasm. Recommend endoscopic ultrasound with tissue sampling.  2. Enhancing lesion in the subcapsular LEFT lateral hepatic lobe is indeterminate but favored small focal nodular hyperplasia or other benign lesion. Metastatic disease is not favored. Additional benign hepatic cysts within the liver.  3.   no evidence of metastatic adenopathy    07/03/2018 Imaging    Ct angiogram of chest and abdominal vessels: Chest CTA impression:  1. No acute cardiopulmonary disease. Specifically, no evidence of thoracic aortic aneurysm or dissection. No evidence of central pulmonary embolism.  Abdomen pelvis CTA impression:  1. No acute findings within the abdomen or pelvis. Specifically, no evidence of abdominal aortic aneurysm, dissection or mesenteric ischemia. 2. Pancreatic ductal dilatation with questioned approximately 6.0 cm hypoattenuating mass involving the proximal body of pancreas, incompletely evaluated on this arterial phase examination. Further evaluation with MRCP is advised. 3. Indeterminate approximately 1.2 cm hyperenhancing lesion within the subcapsular aspect of the left lobe of the liver, potentially representative of a perfusion anomaly or flash filling hemangioma, though incompletely characterized on the present examination. This lesion may also be further evaluated at the time of the recommended MRCP.    07/03/2018 Tumor Marker    Patient's tumor was tested for the following markers: CA-199 Results of the tumor marker test revealed 19    07/03/2018 - 07/05/2018 Hospital Admission    She was admitted to the hospital due to acute lower back pain and was diagnosed with acute pancreatitis.  Further evaluation detected abnormal lesion in the pancreas.  Further EUS and biopsy confirmed adenocarcinoma of the head of the pancreas    07/05/2018 Pathology  Results    FINE NEEDLE ASPIRATION, ENDOSCOPIC, PANCREAS BODY(SPECIMEN 1 OF 1 COLLECTED 07/05/18): MALIGNANT CELLS PRESENT, CONSISTENT WITH ADENOCARCINOMA.  THERE IS LIKELY INSUFFICIENT MATERIAL FOR ADDITIONAL STUDIES    07/05/2018 Procedure    EUS report No lymphadenopathy seen. There was no sign of significant endosonographic abnormality in the left lobe of the liver. No focal pathology was identified. There was no sign of significant endosonographic abnormality in the common bile duct. An unremarkable gallbladder was identified. A round mass was identified in the pancreatic body. The mass was isoechoic. The mass measured 15 mm by 19 mm in maximal cross-sectional diameter. The outer margins were smooth. An intact interface was seen between the mass and the superior mesenteric artery and celiac artery, suggesting a lack of invasion. No obvious venous vascular involvement. The remainder of the pancreas was examined. The endosonographic appearance of parenchyma and the upstream pancreatic duct indicated duct dilation.  Fine needle aspiration for cytology was performed. Color Doppler imaging was utilized prior to needle puncture to confirm a lack of significant vascular structures within the needle path. Four passes were made with the 25 gauge needle using a transgastric approach. A stylet was used. A  cytotechnologist was present to evaluate the adequacy of the specimen. The cellularity of the specimen was adequate. Final cytology results are pending. Estimated blood loss: none.  Summary A round mass was identified in the pancreatic body. The mass was isoechoic. The mass measured 15 mm by 19 mm in maximal cross-sectional diameter. The outer margins were smooth. An intact interface was seen between the mass and the superior mesenteric artery and celiac artery, suggesting a lack of invasion. No obvious venous vascular involvement. The remainder of the pancreas was examined. The endosonographic appearance  of parenchyma and the upstream pancreatic duct indicated duct dilation.  Fine needle aspiration for cytology was performed. Color Doppler imaging was utilized prior to needle puncture to confirm a lack of significant vascular structures within the needle path. Four passes were made with the 25 gauge needle using a transgastric approach. A stylet was used. A cytotechnologist was present to evaluate the adequacy of the specimen. The cellularity of the specimen was adequate. Final cytology results are pending. Estimated blood loss: none.  Impression - There was no evidence of significant pathology in the left lobe of the liver. - There was no sign of significant pathology in the common bile duct. - A mass was identified in the pancreatic body. Fine needle aspiration performed.    07/07/2018 Cancer Staging    Staging form: Exocrine Pancreas, AJCC 8th Edition - Clinical stage from 07/07/2018: Stage IA (cT1c, cN0, cM0) - Signed by Heath Lark, MD on 07/07/2018    07/14/2018 Imaging    Ct scan of abdomen and pelvis Baseline Ct done in [place]    07/22/2018 Surgery    She had surgery in [facility] by Dr. Susette Racer  Procedure: Laparoscopic distal pancreatectomy, laparoscopic splenectomy, explorotomy celiotomy, biopsies and LN removal    07/22/2018 Pathology Results    Adenocarcinoma of pancreas with 80% mucinous component, moderately differentiated, located on the body of pancreas, 1.8 cm greatest dimension, negative margin. No lymphovascular invasion; perineural invasion seen. Closest margin 0.7 cm proximal margin None of 16 LN were involved    07/28/2018 Imaging    Ct scan of abdomen and pelvis in [place] Fluid collection noted near the resection area.    07/31/2018 Procedure    She underwent EUS which revealed a cystic lesion in the pancreatic tail. Cystogastrostomy was performed    09/07/2018 -  Chemotherapy    She received chemotherapy at [facility] with folfirinox with 20% reduction of each  chemo      REVIEW OF SYSTEMS:   Eyes: Denies blurriness of vision Ears, nose, mouth, throat, and face: Denies mucositis or sore throat Respiratory: Denies cough, dyspnea or wheezes Skin: Denies abnormal skin rashes Lymphatics: Denies new lymphadenopathy or easy bruising Behavioral/Psych: Mood is stable, no new changes  All other systems were reviewed with the patient and are negative.  I have reviewed the past medical history, past surgical history, social history and family history with the patient and they are unchanged from previous note.  ALLERGIES:  has No Known Allergies.  MEDICATIONS:  Current Outpatient Medications  Medication Sig Dispense Refill  . dexamethasone (DECADRON) 4 MG tablet Take 4 mg by mouth as directed. Take 2 tablets daily for 2 days after chemo    . lidocaine-prilocaine (EMLA) cream Apply 1 application topically as needed.    . ondansetron (ZOFRAN-ODT) 8 MG disintegrating tablet Take 8 mg by mouth 3 (three) times daily as needed.    . pantoprazole (PROTONIX) 40 MG tablet Take 1 tablet (  40 mg total) by mouth daily. 90 tablet 1  . promethazine (PHENERGAN) 25 MG tablet Take 1 tablet (25 mg total) by mouth every 6 (six) hours as needed for nausea. 30 tablet 0   No current facility-administered medications for this visit.     PHYSICAL EXAMINATION: ECOG PERFORMANCE STATUS: 2 - Symptomatic, <50% confined to bed  Vitals:   09/13/18 0926  BP: (!) 108/91  Pulse: 95  Resp: 18  Temp: 98.6 F (37 C)  SpO2: 99%   Filed Weights   09/13/18 0926  Weight: 111 lb 14.4 oz (50.8 kg)    GENERAL:alert, no distress and comfortable.  She looks thin and mildly cachectic SKIN: skin color, texture, turgor are normal, no rashes or significant lesions EYES: normal, Conjunctiva are pink and non-injected, sclera clear OROPHARYNX:no exudate, no erythema and lips, buccal mucosa, and tongue normal.  Dry mucous membrane is noted NECK: supple, thyroid normal size, non-tender,  without nodularity LYMPH:  no palpable lymphadenopathy in the cervical, axillary or inguinal LUNGS: clear to auscultation and percussion with normal breathing effort HEART: Mild tachycardia, regular rate and rhythm, no murmurs and no lower extremity edema ABDOMEN:abdomen soft, mild discomfort in the epigastric region and normal bowel sounds Musculoskeletal:no cyanosis of digits and no clubbing  NEURO: alert & oriented x 3 with fluent speech, no focal motor/sensory deficits  LABORATORY DATA:  I have reviewed the data as listed    Component Value Date/Time   NA 137 09/13/2018 0936   K 4.2 09/13/2018 0936   CL 99 09/13/2018 0936   CO2 26 09/13/2018 0936   GLUCOSE 103 (H) 09/13/2018 0936   BUN 13 09/13/2018 0936   CREATININE 0.65 09/13/2018 0936   CALCIUM 8.8 (L) 09/13/2018 0936   PROT 7.1 09/13/2018 0936   ALBUMIN 3.7 09/13/2018 0936   AST 14 (L) 09/13/2018 0936   ALT 18 09/13/2018 0936   ALKPHOS 79 09/13/2018 0936   BILITOT 0.9 09/13/2018 0936   GFRNONAA >60 09/13/2018 0936   GFRAA >60 09/13/2018 0936    No results found for: SPEP, UPEP  Lab Results  Component Value Date   WBC 8.0 09/13/2018   NEUTROABS 5.3 09/13/2018   HGB 13.5 09/13/2018   HCT 40.5 09/13/2018   MCV 96.2 09/13/2018   PLT 299 09/13/2018      Chemistry      Component Value Date/Time   NA 137 09/13/2018 0936   K 4.2 09/13/2018 0936   CL 99 09/13/2018 0936   CO2 26 09/13/2018 0936   BUN 13 09/13/2018 0936   CREATININE 0.65 09/13/2018 0936      Component Value Date/Time   CALCIUM 8.8 (L) 09/13/2018 0936   ALKPHOS 79 09/13/2018 0936   AST 14 (L) 09/13/2018 0936   ALT 18 09/13/2018 0936   BILITOT 0.9 09/13/2018 0936       All questions were answered. The patient knows to call the clinic with any problems, questions or concerns. No barriers to learning was detected.  I spent 40 minutes counseling the patient face to face. The total time spent in the appointment was 55 minutes and more than 50%  was on counseling and review of test results  Heath Lark, MD 09/14/2018 10:10 AM

## 2018-09-14 NOTE — Assessment & Plan Note (Signed)
She has profound symptoms of nausea She was only taking Phenergan and not taking Zofran on a regular basis During the course of my evaluation, she has received 1 dose of intravenous Zofran along with 2 L of normal saline infusion Her symptoms had improved dramatically I recommend advanced home care service to deliver IV fluids daily as needed and IV Zofran as needed for nausea and vomiting She agreed with the plan of care I will check urinalysis and urine culture to exclude urinary tract infection

## 2018-09-15 ENCOUNTER — Other Ambulatory Visit: Payer: Self-pay

## 2018-09-15 ENCOUNTER — Ambulatory Visit (HOSPITAL_COMMUNITY)
Admission: RE | Admit: 2018-09-15 | Discharge: 2018-09-15 | Disposition: A | Discharge status: Home / Self Care | Payer: 59 | Source: Ambulatory Visit | Attending: Oncology | Admitting: Oncology

## 2018-09-15 ENCOUNTER — Inpatient Hospital Stay: Payer: 59

## 2018-09-15 ENCOUNTER — Telehealth: Payer: Self-pay

## 2018-09-15 VITALS — BP 126/76 | HR 73 | Temp 98.4°F | Resp 18

## 2018-09-15 DIAGNOSIS — C259 Malignant neoplasm of pancreas, unspecified: Secondary | ICD-10-CM

## 2018-09-15 DIAGNOSIS — C250 Malignant neoplasm of head of pancreas: Secondary | ICD-10-CM | POA: Diagnosis not present

## 2018-09-15 DIAGNOSIS — T451X5A Adverse effect of antineoplastic and immunosuppressive drugs, initial encounter: Secondary | ICD-10-CM

## 2018-09-15 DIAGNOSIS — R110 Nausea: Secondary | ICD-10-CM

## 2018-09-15 MED ORDER — HEPARIN SOD (PORK) LOCK FLUSH 100 UNIT/ML IV SOLN
500.0000 [IU] | Freq: Once | INTRAVENOUS | Status: DC | PRN
  Filled 2018-09-15: qty 5

## 2018-09-15 MED ORDER — SODIUM CHLORIDE 0.9 % IV SOLN
Freq: Once | INTRAVENOUS | Status: AC
  Administered 2018-09-15: 16:00:00 via INTRAVENOUS
  Filled 2018-09-15: qty 250

## 2018-09-15 MED ORDER — SODIUM CHLORIDE 0.9% FLUSH
10.0000 mL | Freq: Once | INTRAVENOUS | Status: DC | PRN
  Filled 2018-09-15: qty 10

## 2018-09-15 NOTE — Progress Notes (Signed)
VO from MD Burr Medico (on call) to increase pt's IVF rate to 750 ml/hr.  Pt VU and agreement of increased rate.  Pt ate and drank during infusion, tolerated well.  Denies nausea or any other concerns at this time.  Bedside report given to [facility], pt moved with belongings to infusion area.

## 2018-09-15 NOTE — Telephone Encounter (Signed)
Spoke with pt by phone to let her know she would need to have a CXR p/t her infusion appt today to verify tip placement.  Pt agreeable. Pt reports recent weight loss.  Was 115 lb one week ago and now down to 108.  Pt has been receiving IVFs here x 2 days d/t nausea / dehydration. Pt instructed to try to eat small frequent meals of high calorie / high healthy fat foods and add in protein supplement drink such as Ensure, Boost.  Pt verbalizes understanding but is concerned and wants Dr Alvy Bimler to be aware of weight loss

## 2018-09-15 NOTE — Patient Instructions (Signed)

## 2018-09-16 ENCOUNTER — Inpatient Hospital Stay: Payer: 59

## 2018-09-18 ENCOUNTER — Telehealth: Payer: Self-pay | Admitting: Hematology and Oncology

## 2018-09-18 NOTE — Telephone Encounter (Signed)
I spoke with the patient over the telephone.  She felt better. She has minimum nausea and able to tolerate oral intake.  She has appointment to go back to [facility] on Thursday I will check on her at the end of the week.

## 2018-09-22 ENCOUNTER — Telehealth: Payer: Self-pay | Admitting: Hematology and Oncology

## 2018-09-22 NOTE — Telephone Encounter (Signed)
I spoke with her over the phone and reviewed recommendation from [facility].  The patient will get IV fluids through her home health agency.  We will maintain contact throughout the rest of her chemo treatment and I will bring her in for extra IV fluids if needed.  I have addressed all her questions and concerns

## 2018-10-05 ENCOUNTER — Encounter: Payer: Self-pay | Admitting: Hematology and Oncology

## 2018-11-28 ENCOUNTER — Other Ambulatory Visit: Payer: Self-pay

## 2018-11-28 ENCOUNTER — Ambulatory Visit (INDEPENDENT_AMBULATORY_CARE_PROVIDER_SITE_OTHER): Payer: 59

## 2018-11-28 ENCOUNTER — Encounter: Payer: Self-pay | Admitting: Podiatry

## 2018-11-28 ENCOUNTER — Ambulatory Visit (INDEPENDENT_AMBULATORY_CARE_PROVIDER_SITE_OTHER): Payer: 59 | Admitting: Podiatry

## 2018-11-28 ENCOUNTER — Other Ambulatory Visit: Payer: Self-pay | Admitting: Podiatry

## 2018-11-28 VITALS — BP 122/72 | HR 69 | Temp 97.9°F | Resp 16

## 2018-11-28 DIAGNOSIS — M722 Plantar fascial fibromatosis: Secondary | ICD-10-CM

## 2018-11-28 NOTE — Progress Notes (Signed)
Subjective:  Patient ID: Alyssa Harper, female    DOB: November 28, 1960,  MRN: [id number] HPI Chief Complaint  Patient presents with  . Foot Pain    Arch bilateral - palpable lumps x several months, not sore, noticing more of them, concerned because she is currently undergoing chemo for pancreatic cancer, being precautious  . New Patient (Initial Visit)    58 y.o. female presents with the above complaint.   Denies fever chills nausea vomiting muscle aches pains calf pain back pain chest pain shortness of breath.  Past Medical History:  Diagnosis Date  . Family history of adverse reaction to anesthesia    "grandmother stopped breathing"  . Headache    "weekly" (07/03/2018)  . High cholesterol   . Mitral valve prolapse   . Pancreatic mass 07/03/2018  . Sjogren's disease [facility])    Past Surgical History:  Procedure Laterality Date  . AUGMENTATION MAMMAPLASTY Bilateral 2013  . ESOPHAGOGASTRODUODENOSCOPY N/A 07/05/2018   Procedure: ESOPHAGOGASTRODUODENOSCOPY (EGD);  Surgeon: Arta Silence, MD;  Location: [facility];  Service: Endoscopy;  Laterality: N/A;  . EUS N/A 07/05/2018   Procedure: UPPER ENDOSCOPIC ULTRASOUND (EUS) LINEAR and Radial;  Surgeon: Arta Silence, MD;  Location: [facility] ENDOSCOPY;  Service: Endoscopy;  Laterality: N/A;  . FINE NEEDLE ASPIRATION N/A 07/05/2018   Procedure: FINE NEEDLE ASPIRATION (FNA) LINEAR;  Surgeon: Arta Silence, MD;  Location: [facility] ENDOSCOPY;  Service: Endoscopy;  Laterality: N/A;    Current Outpatient Medications:  .  ALPRAZolam (XANAX) 0.25 MG tablet, 0.25 mg as needed. , Disp: , Rfl:  .  CREON 24000-76000 units CPEP, TAKE 2 CAPSULES JUST BEFORE MEALS AND 1 CAPSULE JUST BEFORE SNACKS, Disp: , Rfl:  .  dexamethasone (DECADRON) 4 MG tablet, Take 4 mg by mouth as directed. Take 2 tablets daily for 2 days after chemo, Disp: , Rfl:  .  lidocaine-prilocaine (EMLA) cream, Apply 1 application topically as needed., Disp: , Rfl:  .  loratadine (CLARITIN) 10 MG  tablet, , Disp: , Rfl:  .  pantoprazole (PROTONIX) 40 MG tablet, Take 1 tablet (40 mg total) by mouth daily., Disp: 90 tablet, Rfl: 1 .  promethazine (PHENERGAN) 25 MG tablet, Take 1 tablet (25 mg total) by mouth every 6 (six) hours as needed for nausea., Disp: 30 tablet, Rfl: 0 .  UDENYCA 6 MG/0.6ML injection, , Disp: , Rfl:   Allergies  Allergen Reactions  . Tape Dermatitis    IV3000 DRESSING FOR PORT ACCESS - Tegaderm cause irritation to the surrounding skin   Review of Systems Objective:   Vitals:   11/28/18 1102  BP: 122/72  Pulse: 69  Resp: 16  Temp: 97.9 F (36.6 C)    General: Well developed, nourished, in no acute distress, alert and oriented x3   Dermatological: Skin is warm, dry and supple bilateral. Nails x 10 are well maintained; remaining integument appears unremarkable at this time. There are no open sores, no preulcerative lesions, no rash or signs of infection present.  Vascular: Dorsalis Pedis artery and Posterior Tibial artery pedal pulses are 2/4 bilateral with immedate capillary fill time. Pedal hair growth present. No varicosities and no lower extremity edema present bilateral.   Neruologic: Grossly intact via light touch bilateral. Vibratory intact via tuning fork bilateral. Protective threshold with Semmes Wienstein monofilament intact to all pedal sites bilateral. Patellar and Achilles deep tendon reflexes 2+ bilateral. No Babinski or clonus noted bilateral.   Musculoskeletal: No gross boney pedal deformities bilateral. No pain, crepitus, or limitation noted with foot and  ankle range of motion bilateral. Muscular strength 5/5 in all groups tested bilateral.  Nontender soft tissue masses plantar fascia bilaterally measuring less than a centimeter in diameter nontender.  Evaluating the palms of her hands noting Dupuytren's contracture to the fourth digit right hand  Gait: Unassisted, Nonantalgic.    Radiographs:  Radiographs taken today demonstrate an  osseously mature individual soft tissue masses to the plantar fascia no calcifications identified within the masses.  No other acute findings noted.  Assessment & Plan:   Assessment: Plantar fibromatosis l Dupuytren's contractures   Plan: Highly recommend she follow-up with hand surgeon to get started on that but since the feet are nontender I recommended doing nothing he does not want to do any topical medication since she is currently going through chemotherapeutic trials.     Max T. [place], [place]

## 2019-01-26 NOTE — Progress Notes (Signed)
 Outpatient Nutrition Note- Telephone Encounter  Time: 2:08-2:25 PM  This telephone encounter was conducted with the patient's (or proxy's) verbal consent via audio telecommunications.    The patient (or proxy) was instructed to have this encounter in a suitably private space and to only have persons present to whom they give permission to participate. In addition, the patient identity was confirmed by use of name plus two identifiers.  Wt Readings from Last 10 Encounters:  01/25/19 55.8 kg (123 lb 0.3 oz)  01/11/19 55.7 kg (122 lb 12.7 oz)  12/28/18 55.9 kg (123 lb 3.8 oz)  12/14/18 55.4 kg (122 lb 2.2 oz)  11/30/18 55.6 kg (122 lb 9.2 oz)  11/16/18 55.3 kg (121 lb 14.6 oz)  11/02/18 54.1 kg (119 lb 4.3 oz)  10/19/18 54.2 kg (119 lb 7.8 oz)  10/05/18 53.3 kg (117 lb 8.1 oz)  09/21/18 53 kg (116 lb 13.5 oz)   Gradual weight gain of 2.8 kg the past 4 months (4/2-8/6)  Summary: Pt requested nutrition referral for diet post chemo (has 2 cycles FOLFIRINOX left). She reports reading a lot of conflicting nutrition advice for cancer patients. We discussed goal of well-rounded diet consisting of adequate protein, variety of fruits and vegetables, adequate dairy or supplementation to meet Calcium /Vitamin D needs, fatty fish or supplement and healthy fats to include. Pt reports good tolerance to fruits and vegetables and eats a lot of these already. Consumes minimal red meat, focusing on fish, chicken and [place] for protein sources. Eats small meals throughout the day and reports increased bloating and gas the past month. She is working on increasing Creon  24 to aid this- was taking 2/meal and 1/snack. Has tried spreading Creon  to 1 at the beginning, middle and end of meals although does not think this made a difference. Admits that she often forgets to take it with snacks. Discussed weight-based dose of 1-5 Creon  24 per meal and importance of taking with snacks to aid symptoms. Pt knows to watch stools  and indications of inadequate PERT. She has gained past UBW of 115 lbs.   Labs 8/6: unremarkable Meds: dexamethasone , ondansetron , pantoprazole , promethazine   PERT: Creon  24- increasing to 3/meal  Nutrition Diagnosis: From 1/28 RD encounter: -NI-5.2 Patient meets criteria for severe protein-calorie malnutrition related to acute on chronic as evidenced by the following:  Less than/equal to 50% energy intake compared to estimated energy needs for 10 days Moderate muscle mass loss noted in: temple region and clavicle bone region  Resolved with weight gain and adequate intake/PERT.  -NB-1.1 Food, nutrition, and nutrition-related knowledge deficit related to survivorship nutrition as evidenced by pt questions.  Handouts: see other note in encounter  Recommendations:  Maintain weight/avoid losing below UBW of 115 lbs.  Continue regular, well-rounded meals with protein source in each. Incorporate plant-based/mediterranean diet principles. Continue Creon  24: increase to >3/meal and 1-2/snack taking with first bite. Continue to monitor for s/s EPI as indications for increasing dose. Weight-based dose is 1-5 Creon  24/meal and max 23 Creon  24/day. Provided list of evidence-based resources for reference   Total Time Spent: 92 Minute(s) Alyssa Harper RD, LDN Pager: [phone] Phone: [phone]

## 2019-01-26 NOTE — Progress Notes (Signed)
 The following email was sent to pt after discussion:  Hi Alyssa Harper,  It was a pleasure speaking with you today. I linked a plethora of resources below that you can reference for evidence-based nutrition information. One of my favorites is "The Cancer Dietitian" who has a website, blog, webinars, recipes and a podcast that covers various topics on nutrition and cancer. Here is a link to her webinar on plant-based eating after treatment (this does not mean going vegan or vegetarian, but including plenty of plant-based foods in general): [email or web address]  More great resources: Association of Cancer Online Resources (ACOR) ACOR offers access to mailing lists that provide support, information, and community to everyone affected by cancer and related disorders.  American Cancer Society Provides information on all aspects of cancer through a toll-free information line, web site, and published materials. The American Cancer Society's web site is an important extension of the Society's mission to provide lifesaving information to the public. The user-friendly site includes an interactive cancer resource center containing in-depth information on every major cancer type.  [employer] for [employer]) AICR offers a variety of services, from a Nutrition Hotline to healthy recipes to a special children's newsletter, all to help you eat and live more healthfully.  Cancer.net Cancer.Net provides timely, comprehensive, oncologist-approved information from the American Society of Clinical Oncology (ASCO), with support from the [employer]. Cancer.Net brings the expertise and resources of ASCO to people living with cancer and those who care for and about them to help patients and families make informed health care decisions.  Cancer Dietitian Mliss See, MPH, RD, CSO, LDN offers lifestyle  tips for prevention and survivorship. Keeping you well beyond cancer!  Cook For Your LIFE At [employer] it's our mission to teach healthy cooking to people touched by cancer. We turn nutrition guidelines into practical, easy recipes that are designed specifically for the different stages of treatment, and to promote healthy survivorship. Cook for Your Life is not about food as medicine, nor food to cure, it is about the simple, good food that nourishes and comforts us  during treatment. CURE magazine Cure magazine stands for Cancer Updates, Research and Education. Good educational magazine and can get back issues online. The [employer] The [employer] program is designed to support people touched by cancer by giving you the skills and information you need to manage your diet.  MEDLINEplus This website provides links to current, trustworthy health care information. Links are complied by the [employer] of Medicine at the [employer] (NIH). Topics include health and nutrition, drug information and directories of doctors and hospitals.  [employer] (NCI) NCI is the [employer] principal agency for cancer research and training. The NCI website provides information on cancer statistics, clinical trials, research programs and general cancer information.  [employer] for Complementary and Integrative Health A reputable resource for those considering using alternative therapies.  Please don't hesitate to reach out with any further questions or concerns. Best, Alyssa Harper

## 2019-08-26 ENCOUNTER — Ambulatory Visit: Payer: 59 | Attending: Internal Medicine

## 2019-08-26 DIAGNOSIS — Z23 Encounter for immunization: Secondary | ICD-10-CM | POA: Insufficient documentation

## 2019-08-26 NOTE — Progress Notes (Signed)
   Covid-19 Vaccination Clinic  Name:  Alyssa Harper    MRN: [id number] DOB: 1960/07/28  08/26/2019  Alyssa Harper was observed post Covid-19 immunization for 15 minutes without incident. She was provided with Vaccine Information Sheet and instruction to access the V-Safe system.   Alyssa Harper was instructed to call 911 with any severe reactions post vaccine: Marland Kitchen Difficulty breathing  . Swelling of face and throat  . A fast heartbeat  . A bad rash all over body  . Dizziness and weakness   Immunizations Administered    Name Date Dose VIS Date Route   Pfizer COVID-19 Vaccine 08/26/2019  4:18 PM 0.3 mL 06/01/2019 Intramuscular   Manufacturer: [place]   Lot: [id number]   [place]: [id number]

## 2019-09-25 ENCOUNTER — Ambulatory Visit: Payer: 59 | Attending: Internal Medicine

## 2019-09-25 DIAGNOSIS — Z23 Encounter for immunization: Secondary | ICD-10-CM

## 2019-09-25 NOTE — Progress Notes (Signed)
   Covid-19 Vaccination Clinic  Name:  Anayancy Poland    MRN: [id number] DOB: 1960/09/26  09/25/2019  Ms. Huxtable was observed post Covid-19 immunization for 15 minutes without incident. She was provided with Vaccine Information Sheet and instruction to access the V-Safe system.   Ms. Yates was instructed to call 911 with any severe reactions post vaccine: Marland Kitchen Difficulty breathing  . Swelling of face and throat  . A fast heartbeat  . A bad rash all over body  . Dizziness and weakness   Immunizations Administered    Name Date Dose VIS Date Route   Pfizer COVID-19 Vaccine 09/25/2019  2:37 PM 0.3 mL 06/01/2019 Intramuscular   Manufacturer: [place]   Lot: [id number]   [place]: [id number]

## 2019-12-16 IMAGING — DX DG CERVICAL SPINE COMPLETE 4+V
5 series · 5 of 5 positions shown · non-contrast
Comparison: None.

CLINICAL DATA: Neck pain, no injury

EXAM:
CERVICAL SPINE - COMPLETE 4+ VIEW

[dg cervical spine complete (1 of 5)]
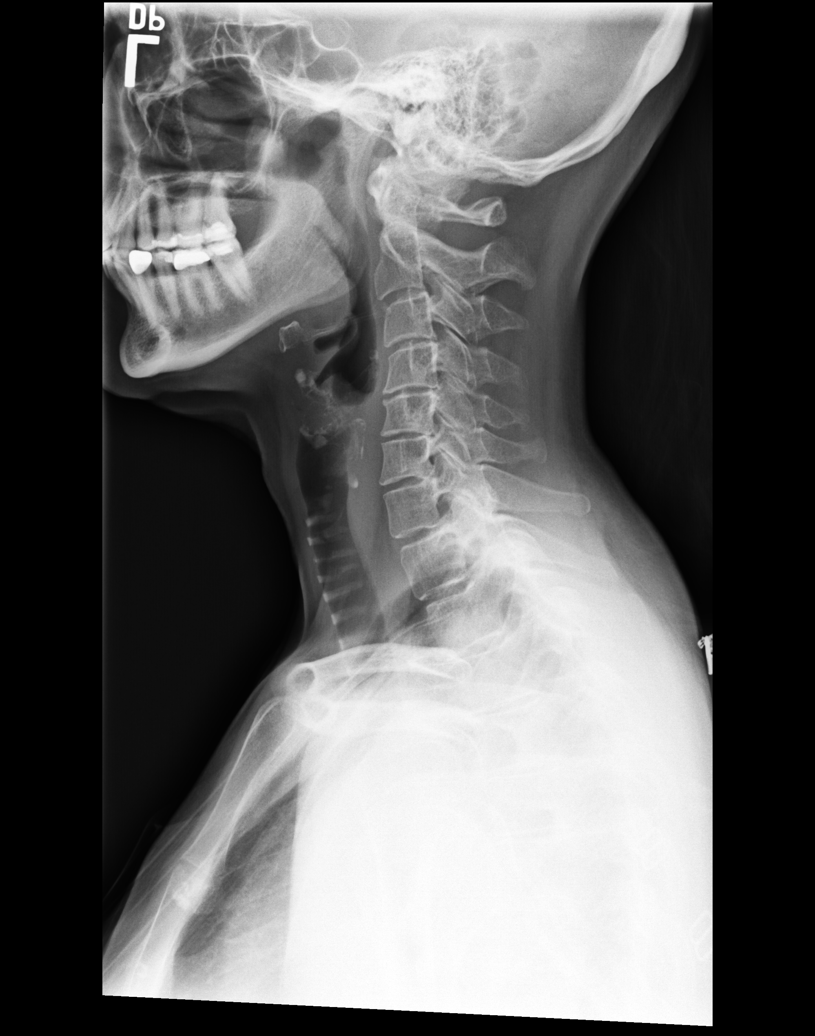

[dg cervical spine complete (2 of 5)]
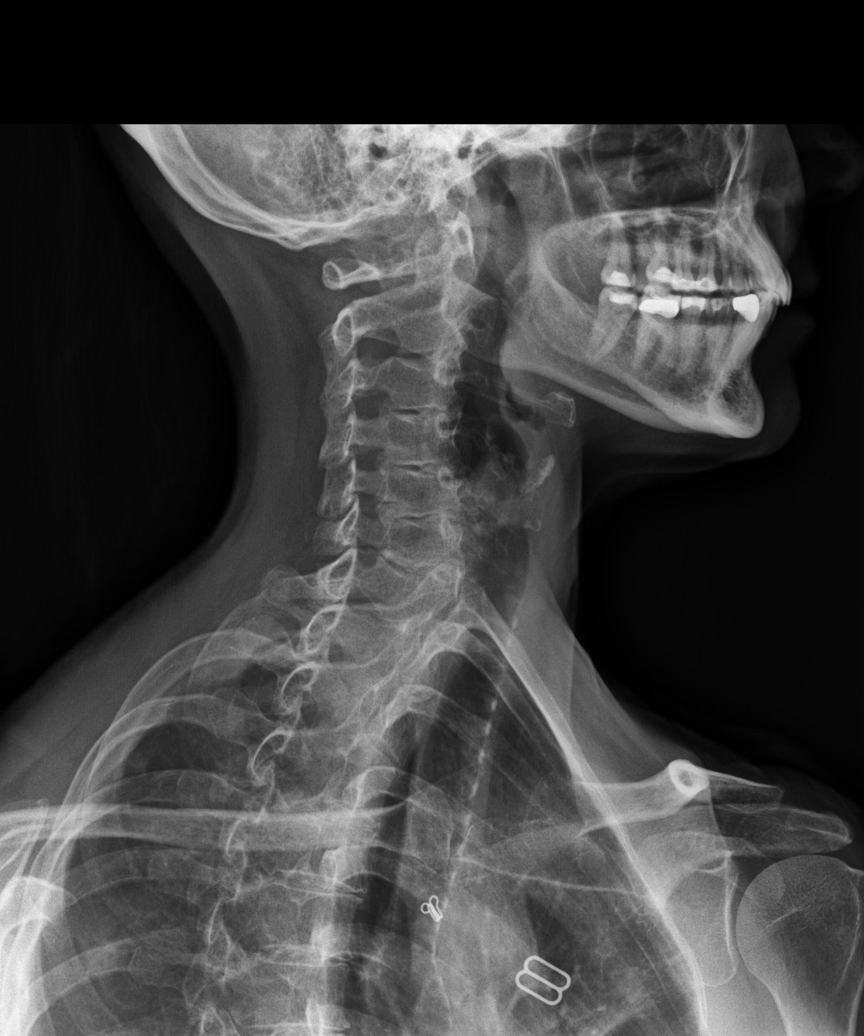

[dg cervical spine complete (3 of 5)]
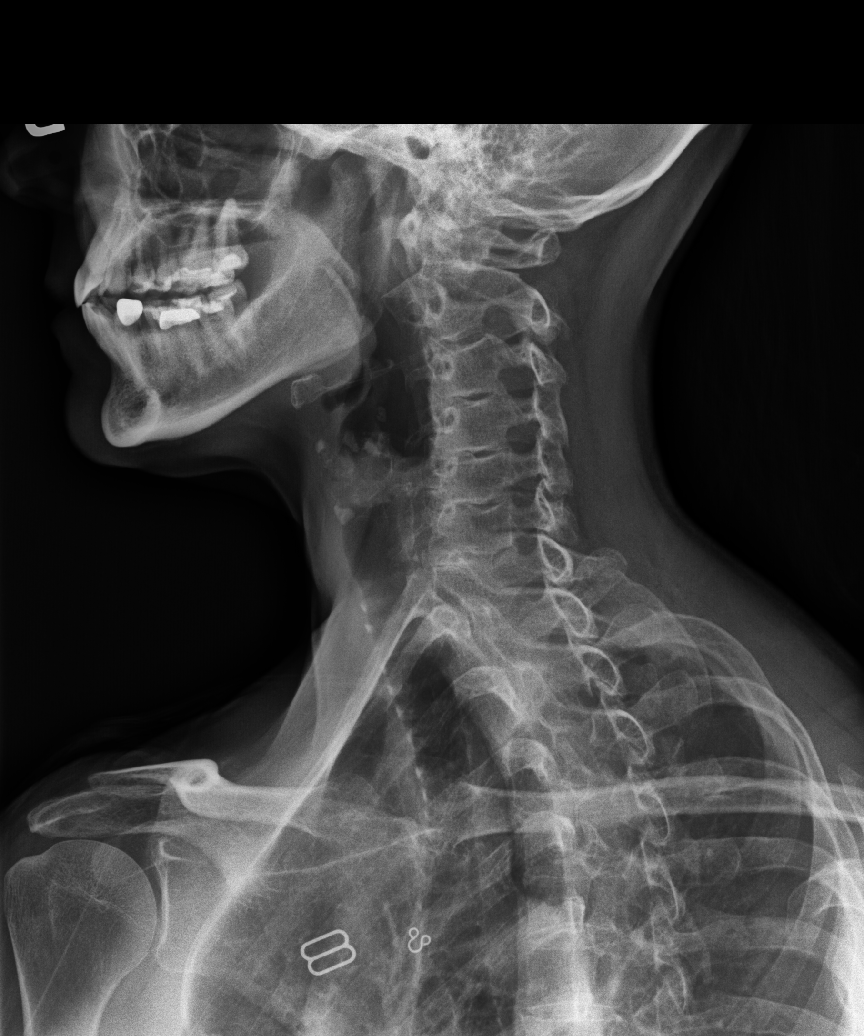

[dg cervical spine complete (4 of 5)]
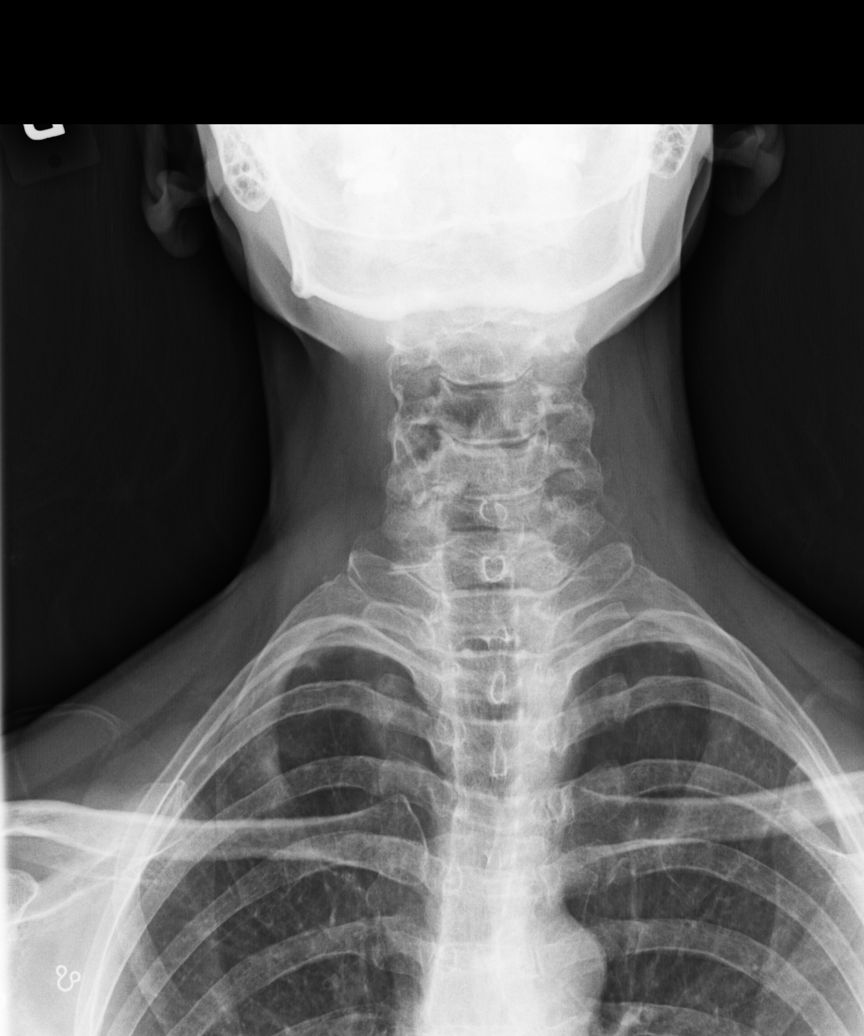

[dg cervical spine complete (5 of 5)]
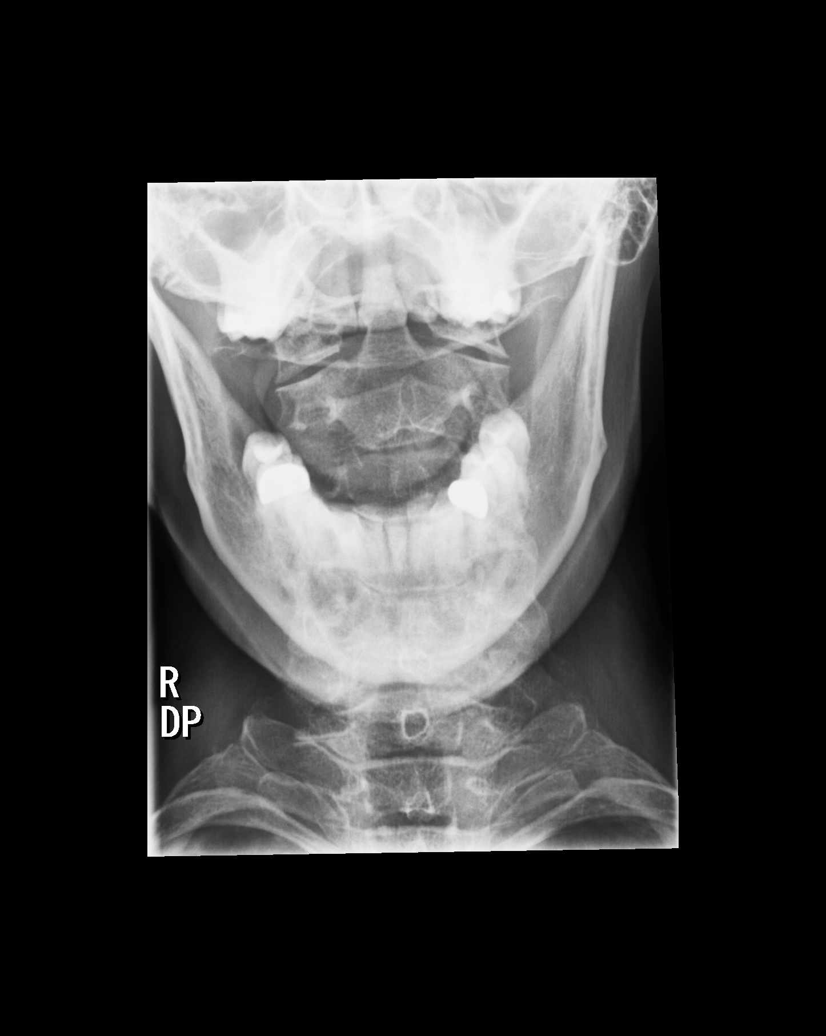

[5 of 5 positions shown; findings below may reference images not displayed]

FINDINGS: The cervical vertebrae are somewhat straightened in alignment. There
is degenerative disc disease diffusely from C3-C7 with loss of disc
space and sclerosis with spurring present. No acute fracture is seen
and no prevertebral soft tissue swelling is noted. On oblique views,
there is some foraminal narrowing at C4-5, C5-6, and C6-7 levels
bilaterally. The odontoid process is intact. The lung apices appear
clear.
IMPRESSION: Straightened alignment with diffuse degenerative disc disease from
C3-C7. Foraminal narrowing is noted at C4-5, C5-6, and C6-7 levels.

## 2020-03-05 IMAGING — DX CHEST - 2 VIEW
2 series · 2 of 2 positions shown · non-contrast
Comparison: Chest x-ray dated July 03, 2018.

CLINICAL DATA: Verified port placement. History of pancreatic
cancer.

EXAM:
CHEST - 2 VIEW

[chest pa]
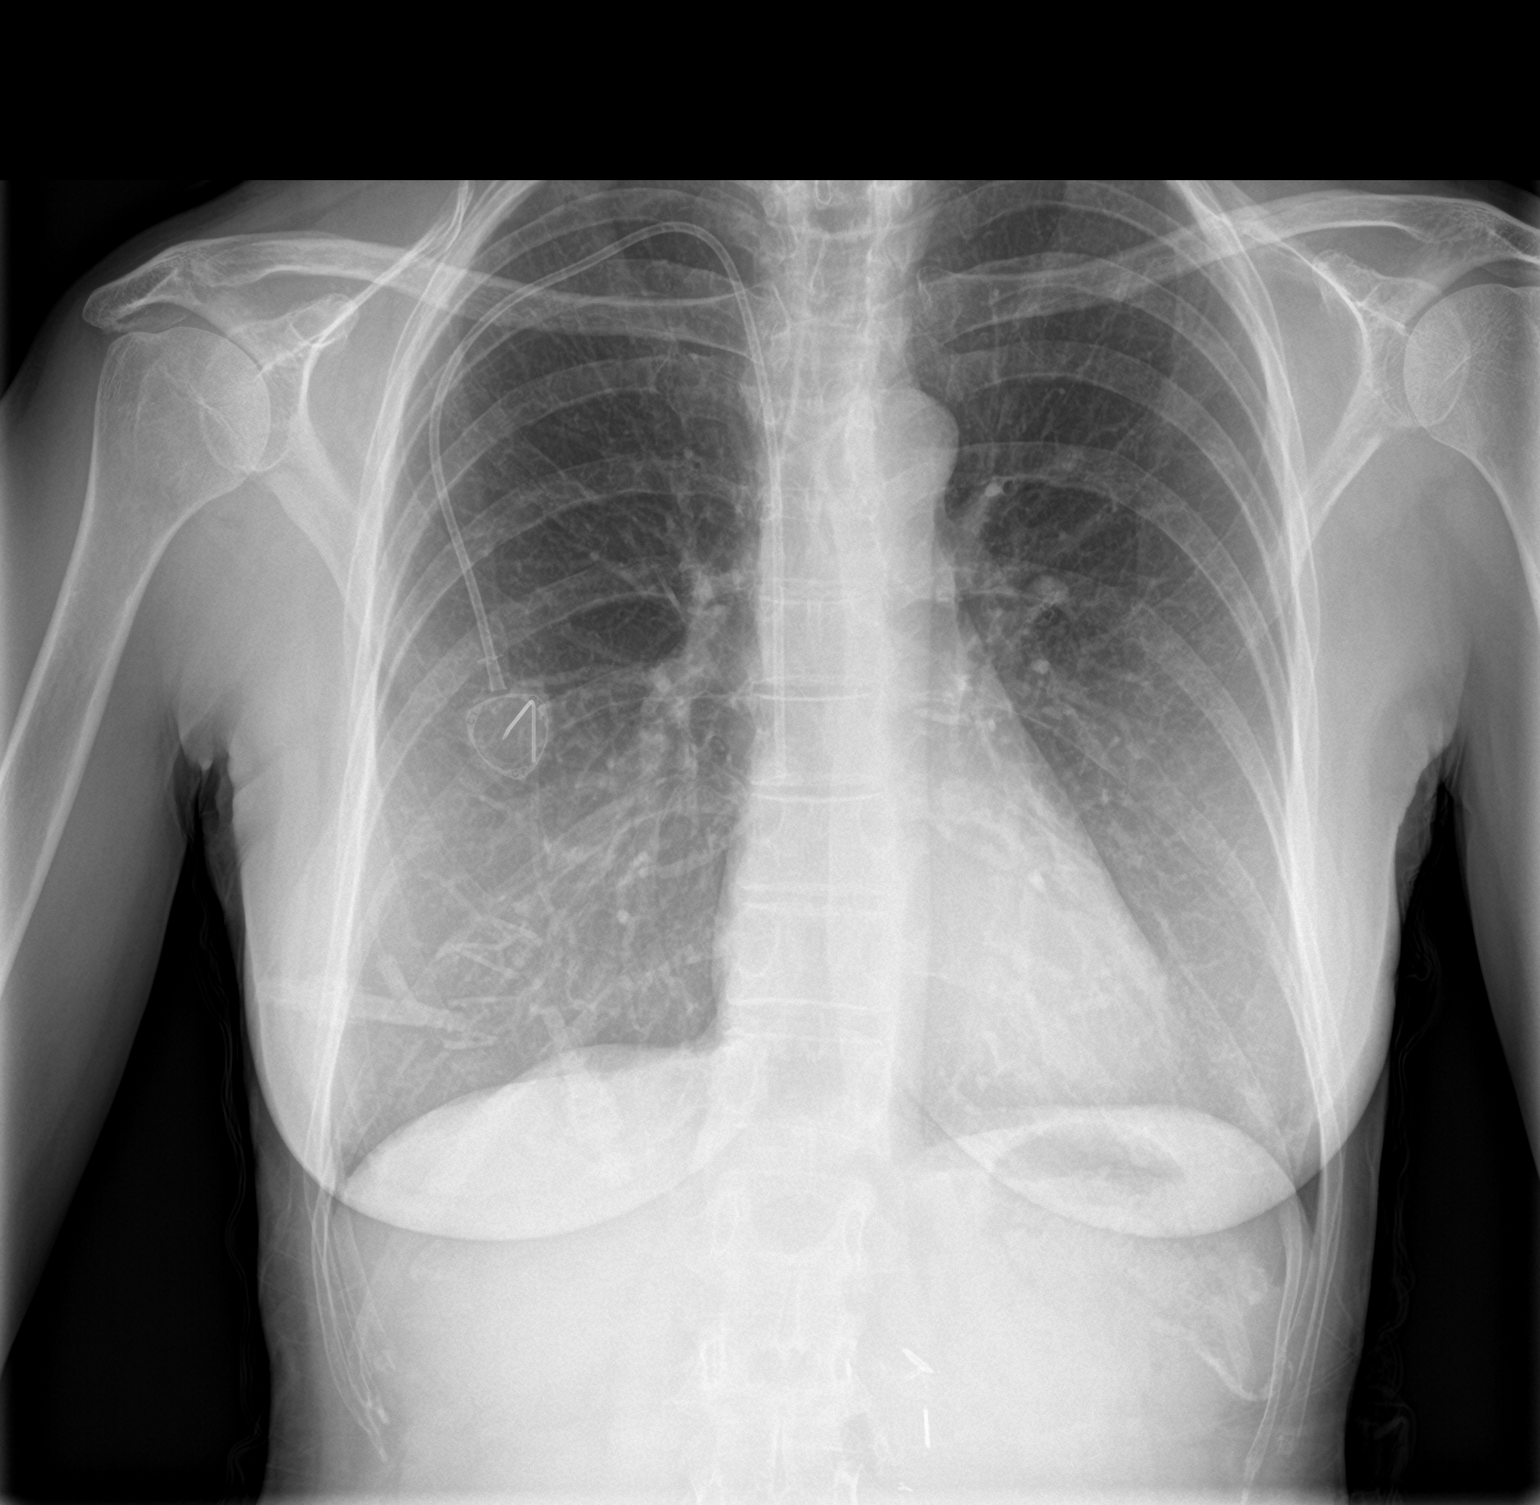

[chest lat]
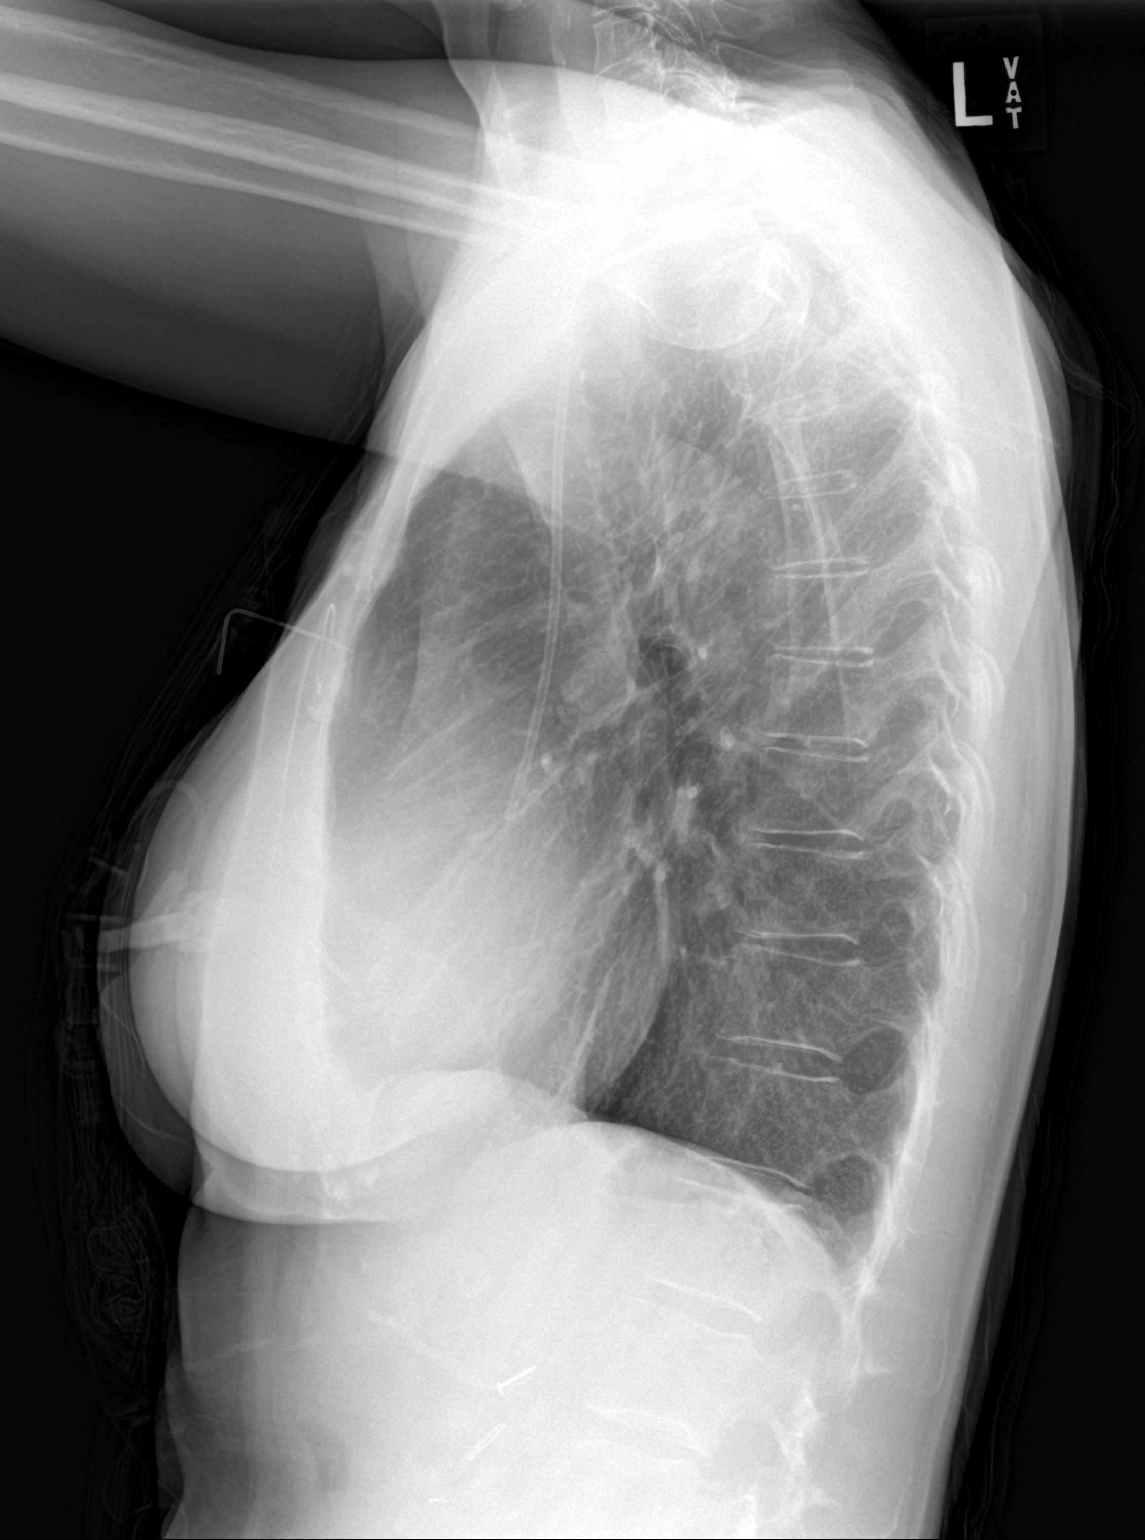

[2 of 2 positions shown; findings below may reference images not displayed]

FINDINGS: Right chest wall port catheter with tip at the cavoatrial junction.
The heart size and mediastinal contours are within normal limits.
Normal pulmonary vascularity. No focal consolidation, pleural
effusion, or pneumothorax. No acute osseous abnormality.
IMPRESSION: 1. Right chest wall port catheter appropriately positioned with tip
at the cavoatrial junction.
2.  No active cardiopulmonary disease.

## 2020-08-27 ENCOUNTER — Other Ambulatory Visit: Payer: 59

## 2022-10-13 ENCOUNTER — Other Ambulatory Visit: Payer: Self-pay

## 2022-10-13 ENCOUNTER — Encounter (HOSPITAL_COMMUNITY): Payer: Self-pay | Admitting: Radiology

## 2022-10-13 ENCOUNTER — Emergency Department (HOSPITAL_COMMUNITY)
Admission: EM | Admit: 2022-10-13 | Discharge: 2022-10-13 | Disposition: A | Discharge status: Home / Self Care | Payer: BC Managed Care – PPO | Attending: Emergency Medicine | Admitting: Emergency Medicine

## 2022-10-13 ENCOUNTER — Emergency Department (HOSPITAL_COMMUNITY): Payer: BC Managed Care – PPO

## 2022-10-13 DIAGNOSIS — R112 Nausea with vomiting, unspecified: Secondary | ICD-10-CM | POA: Diagnosis not present

## 2022-10-13 DIAGNOSIS — R197 Diarrhea, unspecified: Secondary | ICD-10-CM | POA: Insufficient documentation

## 2022-10-13 DIAGNOSIS — Z8507 Personal history of malignant neoplasm of pancreas: Secondary | ICD-10-CM | POA: Insufficient documentation

## 2022-10-13 DIAGNOSIS — R1084 Generalized abdominal pain: Secondary | ICD-10-CM | POA: Diagnosis present

## 2022-10-13 LAB — URINALYSIS, ROUTINE W REFLEX MICROSCOPIC
Bilirubin Urine: NEGATIVE
Glucose, UA: NEGATIVE mg/dL
Hgb urine dipstick: NEGATIVE
Ketones, ur: NEGATIVE mg/dL
Leukocytes,Ua: NEGATIVE
Nitrite: NEGATIVE
Protein, ur: NEGATIVE mg/dL
Specific Gravity, Urine: 1.019 (ref 1.005–1.030)
pH: 8 (ref 5.0–8.0)

## 2022-10-13 LAB — POC OCCULT BLOOD, ED: Fecal Occult Bld: NEGATIVE

## 2022-10-13 LAB — COMPREHENSIVE METABOLIC PANEL
ALT: 16 U/L (ref 0–44)
AST: 22 U/L (ref 15–41)
Albumin: 4.2 g/dL (ref 3.5–5.0)
Alkaline Phosphatase: 66 U/L (ref 38–126)
Anion gap: 11 (ref 5–15)
BUN: 13 mg/dL (ref 8–23)
CO2: 24 mmol/L (ref 22–32)
Calcium: 9.1 mg/dL (ref 8.9–10.3)
Chloride: 103 mmol/L (ref 98–111)
Creatinine, Ser: 0.75 mg/dL (ref 0.44–1.00)
GFR, Estimated: >60 mL/min (ref >60)
Glucose, Bld: 118 mg/dL — ABNORMAL HIGH (ref 70–99)
Potassium: 4.1 mmol/L (ref 3.5–5.1)
Sodium: 138 mmol/L (ref 135–145)
Total Bilirubin: 0.9 mg/dL (ref 0.3–1.2)
Total Protein: 7.4 g/dL (ref 6.5–8.1)

## 2022-10-13 LAB — CBC
HCT: 43.5 % (ref 36.0–46.0)
Hemoglobin: 14.2 g/dL (ref 12.0–15.0)
MCH: 32.4 pg (ref 26.0–34.0)
MCHC: 32.6 g/dL (ref 30.0–36.0)
MCV: 99.3 fL (ref 80.0–100.0)
Platelets: 335 10*3/uL (ref 150–400)
RBC: 4.38 MIL/uL (ref 3.87–5.11)
RDW: 12.2 % (ref 11.5–15.5)
WBC: 15.5 10*3/uL — ABNORMAL HIGH (ref 4.0–10.5)
nRBC: 0 % (ref 0.0–0.2)

## 2022-10-13 LAB — LIPASE, BLOOD: Lipase: 28 U/L (ref 11–51)

## 2022-10-13 MED ORDER — LACTATED RINGERS IV BOLUS
1000.0000 mL | Freq: Once | INTRAVENOUS | Status: AC
  Administered 2022-10-13: 1000 mL via INTRAVENOUS

## 2022-10-13 MED ORDER — IOHEXOL 300 MG/ML  SOLN
100.0000 mL | Freq: Once | INTRAMUSCULAR | Status: AC | PRN
  Administered 2022-10-13: 100 mL via INTRAVENOUS

## 2022-10-13 MED ORDER — SODIUM CHLORIDE (PF) 0.9 % IJ SOLN
INTRAMUSCULAR | Status: AC
  Filled 2022-10-13: qty 50

## 2022-10-13 MED ORDER — HYOSCYAMINE SULFATE 0.125 MG SL SUBL: 0.2500 mg | SUBLINGUAL_TABLET | Freq: Once | SUBLINGUAL | Status: DC

## 2022-10-13 MED ORDER — MORPHINE SULFATE (PF) 2 MG/ML IV SOLN
2.0000 mg | Freq: Once | INTRAVENOUS | Status: DC
  Filled 2022-10-13: qty 1

## 2022-10-13 MED ORDER — ONDANSETRON 8 MG PO TBDP: 8.0000 mg | ORAL_TABLET | Freq: Three times a day (TID) | ORAL | 0 refills | Status: DC | PRN

## 2022-10-13 MED ORDER — OXYCODONE-ACETAMINOPHEN 5-325 MG PO TABS: 2.0000 | ORAL_TABLET | Freq: Once | ORAL | Status: DC

## 2022-10-13 MED ORDER — ONDANSETRON HCL 4 MG/2ML IJ SOLN
4.0000 mg | Freq: Once | INTRAMUSCULAR | Status: AC | PRN
  Administered 2022-10-13: 4 mg via INTRAVENOUS
  Filled 2022-10-13: qty 2

## 2022-10-13 NOTE — ED Notes (Signed)
Pt. Refused BP

## 2022-10-13 NOTE — ED Notes (Signed)
Patient refused Morphine states she does not need it right now due to lack of pain.

## 2022-10-13 NOTE — ED Provider Notes (Signed)
[place] EMERGENCY DEPARTMENT AT [facility] Provider Note   CSN: [id number] Arrival date & time: 10/13/22  1119     History  Chief Complaint  Patient presents with   Abdominal Pain   Nausea   Diarrhea    Alyssa Harper is a 62 y.o. female.  HPI 62 year old female presents today complaining of abdominal pain with nausea vomiting and diarrhea.  Patient with history of pancreatic cancer.  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  She reports that she has been doing well until today.  She woke up in the early morning with cramping abdominal pain.  It was diffuse in nature.  She has had multiple episodes of nausea vomiting and dark stools.  Denies fever, chills, UTI symptoms, frequency, hematuria, chest pain, dyspnea.  She reports taking Protonix as needed which she did take.  She was well when she went to bed last night.  She has Creon that she uses as needed.     Home Medications Prior to Admission medications   Medication Sig Start Date End Date Taking? Authorizing Provider  ondansetron (ZOFRAN-ODT) 8 MG disintegrating tablet Take 1 tablet (8 mg total) by mouth every 8 (eight) hours as needed for nausea or vomiting. 10/13/22  Yes Margarita Grizzle, MD  ALPRAZolam Prudy Feeler) 0.25 MG tablet 0.25 mg as needed.  07/05/18   [provider]  CREON 24000-76000 units CPEP TAKE 2 CAPSULES JUST BEFORE MEALS AND 1 CAPSULE JUST BEFORE SNACKS 09/05/18   [provider]  dexamethasone (DECADRON) 4 MG tablet Take 4 mg by mouth as directed. Take 2 tablets daily for 2 days after chemo 09/07/18   [provider]  lidocaine-prilocaine (EMLA) cream Apply 1 application topically as needed. 09/06/18   [provider]  loratadine (CLARITIN) 10 MG tablet  11/16/18   [provider]  pantoprazole (PROTONIX) 40 MG tablet Take 1 tablet (40 mg total) by mouth daily. 09/13/18   Artis Delay, MD  promethazine (PHENERGAN) 25 MG tablet Take 1  tablet (25 mg total) by mouth every 6 (six) hours as needed for nausea. 07/05/18   Edsel Petrin, DO  UDENYCA 6 MG/0.6ML injection  10/19/18   [provider]      Allergies    Tape    Review of Systems   Review of Systems  Physical Exam Updated Vital Signs BP (!) 113/59 (BP Location: Left Arm)   Pulse 79   Temp 99.9 F (37.7 C) (Oral)   Resp 16   Ht 1.651 m (5\' 5" )   Wt 53.5 kg   SpO2 95%   BMI 19.64 kg/m  Physical Exam Vitals and nursing note reviewed.  Constitutional:      General: She is not in acute distress.    Appearance: She is well-developed.  HENT:     Head: Normocephalic.     Mouth/Throat:     Mouth: Mucous membranes are moist.  Eyes:     Extraocular Movements: Extraocular movements intact.  Cardiovascular:     Rate and Rhythm: Normal rate and regular rhythm.  Pulmonary:     Effort: Pulmonary effort is normal.     Breath sounds: Normal breath sounds.  Abdominal:     General: Abdomen is flat. Bowel sounds are increased.     Palpations: Abdomen is soft.     Tenderness: There is no abdominal tenderness.  Skin:    General: Skin is warm and dry.     Capillary Refill: Capillary refill takes less  than 2 seconds.  Neurological:     General: No focal deficit present.     Mental Status: She is alert.  Psychiatric:        Mood and Affect: Mood normal.     ED Results / Procedures / Treatments   Labs (all labs ordered are listed, but only abnormal results are displayed) Labs Reviewed  COMPREHENSIVE METABOLIC PANEL - Abnormal; Notable for the following components:      Result Value   Glucose, Bld 118 (*)    All other components within normal limits  CBC - Abnormal; Notable for the following components:   WBC 15.5 (*)    All other components within normal limits  LIPASE, BLOOD  URINALYSIS, ROUTINE W REFLEX MICROSCOPIC  POC OCCULT BLOOD, ED    EKG None  Radiology CT ABDOMEN PELVIS W CONTRAST  Result Date: 10/13/2022 CLINICAL DATA:   Abdominal pain. 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 Liquid, dark colored stools. EXAM: CT ABDOMEN AND PELVIS WITH CONTRAST TECHNIQUE: Multidetector CT imaging of the abdomen and pelvis was performed using the standard protocol following bolus administration of intravenous contrast. RADIATION DOSE REDUCTION: This exam was performed according to the departmental dose-optimization program which includes automated exposure control, adjustment of the mA and/or kV according to patient size and/or use of iterative reconstruction technique. CONTRAST:  OMNIPAQUE IOHEXOL 300 MG/ML  SOLN COMPARISON:  Abdominal MRI 07/03/2018 FINDINGS: Lower chest: Breast implants noted. Dependent subsegmental atelectasis in both lower lobes. Hepatobiliary: Fluid density hepatic cysts noted. No metastatic lesion to the liver is identified. Gallbladder unremarkable. No significant biliary dilatation. The patient previously had an arterial phase enhancing lesion anteriorly in the lateral segment left hepatic lobe (image 51 of series 1401 of the MRI from 07/03/2018) favoring focal nodular hyperplasia. This lesion is not visible on today's portal venous phase images. Pancreas: Partial pancreatectomy involving the pancreatic tail and part of the pancreatic body. The remainder the pancreas appears unremarkable. Spleen: Splenectomy Adrenals/Urinary Tract: Unremarkable Stomach/Bowel: Mild prominence the gastric body wall is probably from nondistention. Overall no specific abnormality observed. Vascular/Lymphatic: Unremarkable Reproductive: Unremarkable Other: No supplemental non-categorized findings. Musculoskeletal: Unremarkable IMPRESSION: 1. A specific cause for the patient's abdominal pain is not identified. 2. Partial pancreatectomy and splenectomy. 3. Hepatic cysts. 4. Mild prominence of the gastric body wall is probably from nondistention. Electronically Signed   By: Gaylyn Rong M.D.   On: 10/13/2022 14:56    Procedures Procedures    Medications Ordered in ED Medications  morphine (PF) 2 MG/ML injection 2 mg (2 mg Intravenous Not Given 10/13/22 1412)  ondansetron (ZOFRAN) injection 4 mg (4 mg Intravenous Given 10/13/22 1155)  lactated ringers bolus 1,000 mL (1,000 mLs Intravenous New Bag/Given 10/13/22 1259)  iohexol (OMNIPAQUE) 300 MG/ML solution 100 mL (100 mLs Intravenous Contrast Given 10/13/22 1349)    ED Course/ Medical Decision Making/ A&P Clinical Course as of 10/13/22 1535  Wed Oct 13, 2022  1237 Lipase reviewed and within normal limits [DR]  1237 Complete metabolic panel reviewed interpreted glucose slightly elevated at 118 otherwise within normal limits [DR]  1237 CBC reviewed interpreted significant for elevated white blood cell count of 15,500, otherwise within normal limits [DR]  1237 Urinalysis reviewed interpreted within normal limits [DR]  1535 Patient tolerating fluids. [DR]    Clinical Course User Index [DR] Margarita Grizzle, MD  Medical Decision Making Amount and/or Complexity of Data Reviewed Labs: ordered. Radiology: ordered.  Risk Prescription drug management.   62 year old female history of pancreatic cancer in remission presents today complaining of abdominal pain, nausea, vomiting, diarrhea.  Patient evaluated here with labs, urinalysis, CT scan.  Labs and urine are normal with exception of leukocytosis with white count of 15,500 CT scan shows no specific cause of the patient's abdominal pain and partial pancreatectomy and splenectomy with hepatic cysts.  Mild prominence of the gastric body is probably from nondistention as noted per radiologist. Differential diagnosis includes but is not limited to infectious etiology, small bowel obstruction, recurrence of pancreatic disease, other diseases of the pancreas, gastritis, duodenitis, liver etiologies including leary colic, urinary tract infection  symptoms, diseases of the lower chest and pelvis. Based on lab findings Discussed above findings with patient.  Most suspicious for infectious etiology with white blood cell count of 15,500. Patient did not give stool sample here in ED. If patient tolerates liquids, plan Zofran to pharmacy and strict return precautions with outpatient follow-up.        Final Clinical Impression(s) / ED Diagnoses Final diagnoses:  Generalized abdominal pain  Nausea vomiting and diarrhea    Rx / DC Orders ED Discharge Orders          Ordered    ondansetron (ZOFRAN-ODT) 8 MG disintegrating tablet  Every 8 hours PRN        10/13/22 1522              Margarita Grizzle, MD 10/13/22 1535

## 2022-10-13 NOTE — ED Triage Notes (Signed)
Pt arrived via POV. C/o adb pain, N/V/D since 0400 today. Pt states stools are liquid and black.  Hx pancreatic cx  AOx4

## 2022-10-13 NOTE — ED Notes (Signed)
RN gave patient fluids to attempt fluid challenge, patient able to tolerate fluids well, denies N/V/D at this time.

## 2023-10-13 ENCOUNTER — Encounter (HOSPITAL_BASED_OUTPATIENT_CLINIC_OR_DEPARTMENT_OTHER): Payer: Self-pay

## 2023-10-13 ENCOUNTER — Emergency Department (HOSPITAL_BASED_OUTPATIENT_CLINIC_OR_DEPARTMENT_OTHER)

## 2023-10-13 ENCOUNTER — Emergency Department (HOSPITAL_BASED_OUTPATIENT_CLINIC_OR_DEPARTMENT_OTHER)
Admission: EM | Admit: 2023-10-13 | Discharge: 2023-10-13 | Disposition: A | Discharge status: Home / Self Care | Attending: Emergency Medicine | Admitting: Emergency Medicine

## 2023-10-13 ENCOUNTER — Other Ambulatory Visit: Payer: Self-pay

## 2023-10-13 DIAGNOSIS — R1013 Epigastric pain: Secondary | ICD-10-CM | POA: Diagnosis present

## 2023-10-13 DIAGNOSIS — Z8507 Personal history of malignant neoplasm of pancreas: Secondary | ICD-10-CM | POA: Insufficient documentation

## 2023-10-13 DIAGNOSIS — K3184 Gastroparesis: Secondary | ICD-10-CM | POA: Diagnosis not present

## 2023-10-13 DIAGNOSIS — K30 Functional dyspepsia: Secondary | ICD-10-CM

## 2023-10-13 LAB — URINALYSIS, ROUTINE W REFLEX MICROSCOPIC
Bilirubin Urine: NEGATIVE
Glucose, UA: NEGATIVE mg/dL
Hgb urine dipstick: NEGATIVE
Ketones, ur: 40 mg/dL — AB
Nitrite: NEGATIVE
Protein, ur: 30 mg/dL — AB
Specific Gravity, Urine: 1.024 (ref 1.005–1.030)
pH: 8.5 — ABNORMAL HIGH (ref 5.0–8.0)

## 2023-10-13 LAB — CBC
HCT: 41.9 % (ref 36.0–46.0)
Hemoglobin: 14.1 g/dL (ref 12.0–15.0)
MCH: 32.6 pg (ref 26.0–34.0)
MCHC: 33.7 g/dL (ref 30.0–36.0)
MCV: 97 fL (ref 80.0–100.0)
Platelets: 420 10*3/uL — ABNORMAL HIGH (ref 150–400)
RBC: 4.32 MIL/uL (ref 3.87–5.11)
RDW: 12.1 % (ref 11.5–15.5)
WBC: 6 10*3/uL (ref 4.0–10.5)
nRBC: 0 % (ref 0.0–0.2)

## 2023-10-13 LAB — COMPREHENSIVE METABOLIC PANEL WITH GFR
ALT: 15 U/L (ref 0–44)
AST: 24 U/L (ref 15–41)
Albumin: 5 g/dL (ref 3.5–5.0)
Alkaline Phosphatase: 93 U/L (ref 38–126)
Anion gap: 13 (ref 5–15)
BUN: 12 mg/dL (ref 8–23)
CO2: 32 mmol/L (ref 22–32)
Calcium: 10.4 mg/dL — ABNORMAL HIGH (ref 8.9–10.3)
Chloride: 96 mmol/L — ABNORMAL LOW (ref 98–111)
Creatinine, Ser: 0.89 mg/dL (ref 0.44–1.00)
GFR, Estimated: >60 mL/min (ref >60)
Glucose, Bld: 135 mg/dL — ABNORMAL HIGH (ref 70–99)
Potassium: 4 mmol/L (ref 3.5–5.1)
Sodium: 140 mmol/L (ref 135–145)
Total Bilirubin: 0.5 mg/dL (ref 0.0–1.2)
Total Protein: 7.7 g/dL (ref 6.5–8.1)

## 2023-10-13 LAB — LIPASE, BLOOD: Lipase: 42 U/L (ref 11–51)

## 2023-10-13 MED ORDER — ONDANSETRON HCL 4 MG/2ML IJ SOLN
4.0000 mg | Freq: Once | INTRAMUSCULAR | Status: AC
  Administered 2023-10-13: 4 mg via INTRAVENOUS
  Filled 2023-10-13: qty 2

## 2023-10-13 MED ORDER — IOHEXOL 300 MG/ML  SOLN
100.0000 mL | Freq: Once | INTRAMUSCULAR | Status: AC | PRN
  Administered 2023-10-13: 75 mL via INTRAVENOUS

## 2023-10-13 MED ORDER — OXYCODONE HCL 5 MG PO TABS: 5.0000 mg | ORAL_TABLET | Freq: Four times a day (QID) | ORAL | 0 refills | Status: AC | PRN

## 2023-10-13 MED ORDER — ONDANSETRON 4 MG PO TBDP: 4.0000 mg | ORAL_TABLET | Freq: Three times a day (TID) | ORAL | 0 refills | Status: DC | PRN

## 2023-10-13 MED ORDER — OXYCODONE-ACETAMINOPHEN 5-325 MG PO TABS
1.0000 | ORAL_TABLET | ORAL | Status: DC | PRN
  Administered 2023-10-13: 1 via ORAL
  Filled 2023-10-13: qty 1

## 2023-10-13 NOTE — Discharge Instructions (Addendum)
 It appears that your pain is coming from delayed emptying of the stomach.  I have included your CT scan results below for your reference.  Please attend your GI appointment next Tuesday for follow-up of your pain and the scan result.  The radiologist recommended a follow-up CT scan, however, please discuss with your GI doctor if your endoscopy would be sufficient.  Your blood counts, kidney, liver, and pancreas labs were normal.  As discussed, your urine showed some signs of infection, but since you are not having any symptoms we will hold off on treating for a UTI.  If you begin to have any burning when you urinate, increased frequency, or blood in your urine, please follow-up with your PCP for treatment.  You have been prescribed Zofran  (ondansetron ) for nausea and vomiting. You may take this every 8 hours as needed for nausea and vomiting. This medication dissolves under the tongue. You do not need to swallow it.  You have been prescribed Oxycodone -this is a narcotic/controlled substance medication that has potential addicting qualities.  You may take 1 tablet every 6 hours as needed for severe pain.  Do not drive or operate heavy machinery when taking this medicine as it can be sedating. Do not drink alcohol or take other sedating medications when taking this medicine for safety reasons.  Keep this out of reach of small children.     Return to the ER if you are unable to keep down any food or have uncontrolled vomiting, fevers, severe abdominal pain, any other new or concerning symptoms.  "EXAM: CT ABDOMEN AND PELVIS WITH CONTRAST   TECHNIQUE: Multidetector CT imaging of the abdomen and pelvis was performed using the standard protocol following bolus administration of intravenous contrast.   RADIATION DOSE REDUCTION: This exam was performed according to the departmental dose-optimization program which includes automated exposure control, adjustment of the mA and/or kV according  to patient size and/or use of iterative reconstruction technique.   CONTRAST:  75mL OMNIPAQUE  IOHEXOL  300 MG/ML  SOLN   COMPARISON:  October 13, 2022   FINDINGS: Lower chest: No focal airspace consolidation or pleural effusion.The cyst is seen   Hepatobiliary: A couple of small cysts again noted in the left hepatic lobe. No mass. Decompressed gallbladder without radiopaque stones or wall thickening. No intrahepatic or extrahepatic biliary ductal dilation. The portal veins are patent.   Pancreas: Postsurgical changes of a the pancreatectomy. No mass or main ductal dilation. No peripancreatic inflammation or fluid collection.   Spleen: Splenectomy.   Adrenals/Urinary Tract: No adrenal masses. No renal mass. No nephrolithiasis or hydronephrosis. Decompressed urinary bladder without visualized focal abnormality.   Stomach/Bowel: The stomach is distended containing a large amount of ingested material. Apparent abrupt narrowing at the pylorus. No small bowel wall thickening or inflammation. No small bowel obstruction. Normal appendix.   Vascular/Lymphatic: No aortic aneurysm. No intraabdominal or pelvic lymphadenopathy.   Reproductive: Age-related atrophy of the uterus and ovaries. No concerning adnexal mass. No free pelvic fluid.   Other: No pneumoperitoneum, ascites, or mesenteric inflammation.   Musculoskeletal: No acute fracture or destructive lesion. Bilateral breast implants.   IMPRESSION: 1. The stomach is distended containing a large amount of ingested material, which may be due to delayed gastric emptying or a nonspecific gastritis. Short-term CT follow-up recommended to exclude developing gastric outlet obstruction. 2. Distal pancreatectomy changes. No findings of recurrent or distant metastatic disease.     Electronically Signed   By: Rance Burrows M.D.   On: 10/13/2023 20:00"

## 2023-10-13 NOTE — ED Provider Triage Note (Signed)
 Emergency Medicine Provider Triage Evaluation Note  Alyssa Harper , a 63 y.o. female  was evaluated in triage.  Pt complains of epigastric pain.  Review of Systems  Positive:  Negative:   Physical Exam  BP 124/73 (BP Location: Left Arm)   Pulse 81   Temp 98.5 F (36.9 C)   Resp 16   SpO2 100%  Gen:   Awake, no distress   Resp:  Normal effort  MSK:   Moves extremities without difficulty  Other:    Medical Decision Making  Medically screening exam initiated at 5:24 PM.  Appropriate orders placed.  Alyssa Harper was informed that the remainder of the evaluation will be completed by another provider, this initial triage assessment does not replace that evaluation, and the importance of remaining in the ED until their evaluation is complete.  Epigastric pain that feels similar to prior episodes of pancreatitis. Patient with hx of pancreatic cancer 4 years ago and is in remission. Had CT at [facility] on 09/08/2023 (seen in care everywhere) which did not show any acute findings or metastatic disease. Patient's pain worsens with eating and she is vomiting as well. Denies diarrhea, fever, chest pain, SOB, cough.   Alyssa Harper, [place] 10/13/23 1727

## 2023-10-13 NOTE — ED Triage Notes (Signed)
 Pancreatic cancer 4 years ago. In remission.  Starting three weeks ago- pain at breast bone radiating to her back. Usually flares with eating.  + vomiting/ -diarrhea/- fevers. New intermittent bowel incontinence.

## 2023-10-13 NOTE — ED Provider Notes (Signed)
 [place] EMERGENCY DEPARTMENT AT [facility] Provider Note   CSN: [id number] Arrival date & time: 10/13/23  1654     History  Chief Complaint  Patient presents with   Abdominal Pain    Alyssa Harper is a 63 y.o. female with history of pancreatic cancer currently cancer free, presents with concern for epigastric pain that has been ongoing for the past 3 weeks.  Reports pain is worse when eating.  States she has been having to eat smaller meals to prevent the pain from flaring up.  No associated fever, chills, or diarrhea.  Reports 1 episode of vomiting earlier today due to the pain.   Abdominal Pain      Home Medications Prior to Admission medications   Medication Sig Start Date End Date Taking? Authorizing Provider  ondansetron  (ZOFRAN -ODT) 4 MG disintegrating tablet Take 1 tablet (4 mg total) by mouth every 8 (eight) hours as needed for nausea or vomiting. 10/13/23  Yes Rexie Catena, PA-C  oxyCODONE  (ROXICODONE ) 5 MG immediate release tablet Take 1 tablet (5 mg total) by mouth every 6 (six) hours as needed for up to 5 days for severe pain (pain score 7-10). 10/13/23 10/18/23 Yes Rexie Catena, PA-C  ALPRAZolam  (XANAX ) 0.25 MG tablet 0.25 mg as needed.  07/05/18   [provider]  CREON 24000-76000 units CPEP TAKE 2 CAPSULES JUST BEFORE MEALS AND 1 CAPSULE JUST BEFORE SNACKS 09/05/18   [provider]  dexamethasone (DECADRON) 4 MG tablet Take 4 mg by mouth as directed. Take 2 tablets daily for 2 days after chemo 09/07/18   [provider]  lidocaine-prilocaine (EMLA) cream Apply 1 application topically as needed. 09/06/18   [provider]  loratadine (CLARITIN) 10 MG tablet  11/16/18   [provider]  pantoprazole  (PROTONIX ) 40 MG tablet Take 1 tablet (40 mg total) by mouth daily. 09/13/18   Almeda Jacobs, MD  promethazine  (PHENERGAN ) 25 MG tablet Take 1 tablet (25 mg total) by mouth every 6 (six) hours as needed for nausea.  07/05/18   Mikhail, Maryann, DO  UDENYCA 6 MG/0.6ML injection  10/19/18   [provider]      Allergies    Tape    Review of Systems   Review of Systems  Gastrointestinal:  Positive for abdominal pain.    Physical Exam Updated Vital Signs BP 121/71 (BP Location: Left Arm)   Pulse (!) 57   Temp 98.5 F (36.9 C) (Oral)   Resp 16   SpO2 99%  Physical Exam Vitals and nursing note reviewed.  Constitutional:      General: She is not in acute distress.    Appearance: She is well-developed.  HENT:     Head: Normocephalic and atraumatic.  Eyes:     Conjunctiva/sclera: Conjunctivae normal.  Cardiovascular:     Rate and Rhythm: Normal rate and regular rhythm.     Heart sounds: No murmur heard. Pulmonary:     Effort: Pulmonary effort is normal. No respiratory distress.     Breath sounds: Normal breath sounds.  Abdominal:     Palpations: Abdomen is soft.     Tenderness: There is no abdominal tenderness.  Musculoskeletal:        General: No swelling.     Cervical back: Neck supple.  Skin:    General: Skin is warm and dry.     Capillary Refill: Capillary refill takes less than 2 seconds.  Neurological:     Mental Status: She is alert.  Psychiatric:  Mood and Affect: Mood normal.     ED Results / Procedures / Treatments   Labs (all labs ordered are listed, but only abnormal results are displayed) Labs Reviewed  COMPREHENSIVE METABOLIC PANEL WITH GFR - Abnormal; Notable for the following components:      Result Value   Chloride 96 (*)    Glucose, Bld 135 (*)    Calcium 10.4 (*)    All other components within normal limits  CBC - Abnormal; Notable for the following components:   Platelets 420 (*)    All other components within normal limits  URINALYSIS, ROUTINE W REFLEX MICROSCOPIC - Abnormal; Notable for the following components:   APPearance CLOUDY (*)    pH 8.5 (*)    Ketones, ur 40 (*)    Protein, ur 30 (*)    Leukocytes,Ua SMALL (*)    Bacteria,  UA RARE (*)    All other components within normal limits  LIPASE, BLOOD    EKG None  Radiology CT ABDOMEN PELVIS W CONTRAST Result Date: 10/13/2023 CLINICAL DATA:  Pancreatitis, acute, severe, pain at breast bone radiating to the back. Vomiting. EXAM: CT ABDOMEN AND PELVIS WITH CONTRAST TECHNIQUE: Multidetector CT imaging of the abdomen and pelvis was performed using the standard protocol following bolus administration of intravenous contrast. RADIATION DOSE REDUCTION: This exam was performed according to the departmental dose-optimization program which includes automated exposure control, adjustment of the mA and/or kV according to patient size and/or use of iterative reconstruction technique. CONTRAST:  75mL OMNIPAQUE  IOHEXOL  300 MG/ML  SOLN COMPARISON:  October 13, 2022 FINDINGS: Lower chest: No focal airspace consolidation or pleural effusion.The cyst is seen Hepatobiliary: A couple of small cysts again noted in the left hepatic lobe. No mass. Decompressed gallbladder without radiopaque stones or wall thickening. No intrahepatic or extrahepatic biliary ductal dilation. The portal veins are patent. Pancreas: Postsurgical changes of a the pancreatectomy. No mass or main ductal dilation. No peripancreatic inflammation or fluid collection. Spleen: Splenectomy. Adrenals/Urinary Tract: No adrenal masses. No renal mass. No nephrolithiasis or hydronephrosis. Decompressed urinary bladder without visualized focal abnormality. Stomach/Bowel: The stomach is distended containing a large amount of ingested material. Apparent abrupt narrowing at the pylorus. No small bowel wall thickening or inflammation. No small bowel obstruction. Normal appendix. Vascular/Lymphatic: No aortic aneurysm. No intraabdominal or pelvic lymphadenopathy. Reproductive: Age-related atrophy of the uterus and ovaries. No concerning adnexal mass. No free pelvic fluid. Other: No pneumoperitoneum, ascites, or mesenteric inflammation.  Musculoskeletal: No acute fracture or destructive lesion. Bilateral breast implants. IMPRESSION: 1. The stomach is distended containing a large amount of ingested material, which may be due to delayed gastric emptying or a nonspecific gastritis. Short-term CT follow-up recommended to exclude developing gastric outlet obstruction. 2. Distal pancreatectomy changes. No findings of recurrent or distant metastatic disease. Electronically Signed   By: Rance Burrows M.D.   On: 10/13/2023 20:00    Procedures Procedures    Medications Ordered in ED Medications  oxyCODONE -acetaminophen  (PERCOCET/ROXICET) 5-325 MG per tablet 1 tablet (1 tablet Oral Given 10/13/23 1725)  ondansetron  (ZOFRAN ) injection 4 mg (4 mg Intravenous Given 10/13/23 1725)  iohexol  (OMNIPAQUE ) 300 MG/ML solution 100 mL (75 mLs Intravenous Contrast Given 10/13/23 1826)    ED Course/ Medical Decision Making/ A&P                                 Medical Decision Making Amount and/or Complexity of Data Reviewed Labs:  ordered.  Risk Prescription drug management.     Differential diagnosis includes but is not limited to metastasis, cholelithiasis, cholangitis, choledocholithiasis, peptic ulcer, gastritis, gastroenteritis, appendicitis, IBS, IBD, DKA, nephrolithiasis, UTI, pyelonephritis, pancreatitis, diverticulitis, mesenteric ischemia, abdominal aortic aneurysm, small bowel obstruction, volvulus  ED Course:  Upon initial evaluation, patient is well-appearing, stable vital signs.  Abdomen is soft and nontender to palpation.  She is tolerating sips of ice water at bedside.  No active vomiting.  Labs Ordered: I Ordered, and personally interpreted labs.  The pertinent results include:   CBC without leukocytosis.  Elevated platelets at 420 CMP without elevation in LFTs or creatinine.  Normal electrolytes Urinalysis with ketones, protein, and small amount of leukocytes  Imaging Studies ordered: I ordered imaging studies  including CT abdomen pelvis I independently visualized the imaging with scope of interpretation limited to determining acute life threatening conditions related to emergency care. Imaging showed  Distended stomach containing large amount of ingested material, could be due to delayed gastric emptying or a nonspecific gastritis.  Short-term CT follow-up recommended to exclude developing gastric outlet obstruction. No signs of recurrent or distant metastatic disease I agree with the radiologist interpretation    Medications Given: Zofran  for nausea Percocet for pain  Upon re-evaluation, patient reports pain and nausea controlled.  Still able to tolerate water, no episodes of emesis.  Discussed CT scan results with patient.  Given pain is epigastric and worse when eating, this correlates with the distended stomach seen on CT scan.  Feel this is the cause of her pain.  Low concern for any viral gastritis as symptoms been have been ongoing for couple weeks, no diarrhea, no recent travel.  She reports she has a appointment with her GI doctor in 5 days where they were going to do an endoscopy.  I recommended she reach out to them in advance to let them know about her CT scan findings today and to see if they would like to see her sooner.  Given unremarkable labs, abdomen soft nontender, tolerating p.o. intake, feel she is appropriate for outpatient follow-up at this time. Low concern for any other acute pathology at this time.    Impression: Abdominal pain due to distended stomach  Disposition:  The patient was discharged home with instructions to follow-up with her GI doctor at her appointment in 5 days, reach out to them to inform them of CT scan results.  Take Zofran  as needed for nausea. Short course of oxycodone  provided for pain.  Return precautions given.    This chart was dictated using voice recognition software, Dragon. Despite the best efforts of this provider to proofread and correct  errors, errors may still occur which can change documentation meaning.          Final Clinical Impression(s) / ED Diagnoses Final diagnoses:  Delayed gastric emptying    Rx / DC Orders ED Discharge Orders          Ordered    oxyCODONE  (ROXICODONE ) 5 MG immediate release tablet  Every 6 hours PRN        10/13/23 2134    ondansetron  (ZOFRAN -ODT) 4 MG disintegrating tablet  Every 8 hours PRN        10/13/23 2134              Rexie Catena, PA-C 10/13/23 2159    Onetha Bile, MD 10/14/23 1504

## 2023-10-28 NOTE — Progress Notes (Signed)
 Nutrition Assessment  Reason for evaluation: [employer] next level screen for weight loss and poor oral intake. No evidence of weight loss per records.   Admission Diagnosis / Current Problem:  63 y.o. 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    Ideal Body Weight: 138 lb 14.2 oz (63 kg) (for BMI~ 23)  Usual body weight:  54-55 kg  Weight history: Wt Readings from Last 20 Encounters:  10/23/23 54.4 kg (120 lb)  10/21/23 54.4 kg (120 lb)  10/21/23 54.5 kg (120 lb 1.6 oz)  09/22/23 54.4 kg (120 lb)  09/08/23 56.7 kg (125 lb)  03/10/23 56 kg (123 lb 7.3 oz)  01/25/23 55.1 kg (121 lb 7.6 oz)  09/02/22 55.8 kg (123 lb 0.3 oz)  04/22/22 54.4 kg (120 lb)  02/12/22 56.4 kg (124 lb 5.4 oz)  10/12/21 56.6 kg (124 lb 12.5 oz)  10/06/21 55.9 kg (123 lb 3.8 oz)  04/13/21 56.2 kg (123 lb 14.4 oz)  12/11/20 55.9 kg (123 lb 3.8 oz)  06/26/20 54.9 kg (121 lb 0.5 oz)  02/21/20 55.2 kg (121 lb 11.1 oz)  11/22/19 54.8 kg (120 lb 13 oz)  08/28/19 54.5 kg (120 lb 2.4 oz)  08/23/19 54.7 kg (120 lb 9.5 oz)  03/20/19 56 kg (123 lb 7.3 oz)     Weight change:    Nutritionally Pertinent Findings: Food Allergies: NKDA Diet regular Oral Supplements - Adult All Supplements; Ensure Plus High Protein-Assorted; With Meals   Estimated Nutritional Needs:  Calculation Weight Used: Admission weight (54.4 kg) Energy: 30 kcal/kg~ 1600 Protein:  1.2-1.5 g/kg~ 65-82 Fluid:  1.4-1.6 L as tolerated   Nutrition Evaluation: Weight Loss: No significant weight loss - 0 points   PO Intake: Energy intake </= 50% of estimated requirements for >/= 5 days - 2  points   Physical Assessment: Deferred     TOTAL POINTS:              2 = At risk   Malnutrition Assessment: Diagnosis: insufficient evidence available to diagnose malnutrition (10/28/23 1300)   Criteria:   Intervention:    Nutrition Recommendations: Continue Diet as tolerated -Encourage small frequent meals focusing on protein rich choices.  -Liquids between meals  -Chewing foods well -May benefit from soft, low fiber foods at first Maintain good nausea control.   Continue to monitor and replete electrolytes PRN.  Trend weights as able.         Assessed at: 1322  Time spent: 30 Minute(s)  Josette Ellen RD/LDN/CNSC

## 2023-11-18 ENCOUNTER — Encounter: Payer: Self-pay | Admitting: *Deleted

## 2023-11-18 ENCOUNTER — Other Ambulatory Visit: Payer: Self-pay | Admitting: *Deleted

## 2023-11-18 DIAGNOSIS — C259 Malignant neoplasm of pancreas, unspecified: Secondary | ICD-10-CM

## 2023-11-18 NOTE — Progress Notes (Signed)
 PATIENT NAVIGATOR PROGRESS NOTE  Name: Alyssa Harper Date: 11/18/2023 MRN: [id number]  DOB: [birth year]   Reason for visit:  New Patient appt  Comments:  Called and spoke with pt regarding referral from Dr Nino Bass for supportive care and to establish care with Dr Scherrie Curt.  We discussed New pt appt for 12/06/23 at 1:40pm.  She will have first treatment at [facility] on 6/6 and will follow with them for supportive care plan until she can establish with Dr Scherrie Curt  Will call before New Pt appt for review    Time spent counseling/coordinating care: 45-60 minutes

## 2023-11-27 ENCOUNTER — Emergency Department (HOSPITAL_COMMUNITY)
Admission: EM | Admit: 2023-11-27 | Discharge: 2023-11-27 | Disposition: A | Discharge status: Home / Self Care | Attending: Emergency Medicine | Admitting: Emergency Medicine

## 2023-11-27 ENCOUNTER — Emergency Department (HOSPITAL_COMMUNITY)

## 2023-11-27 ENCOUNTER — Other Ambulatory Visit: Payer: Self-pay

## 2023-11-27 ENCOUNTER — Encounter (HOSPITAL_COMMUNITY): Payer: Self-pay

## 2023-11-27 DIAGNOSIS — R072 Precordial pain: Secondary | ICD-10-CM | POA: Insufficient documentation

## 2023-11-27 DIAGNOSIS — Z8507 Personal history of malignant neoplasm of pancreas: Secondary | ICD-10-CM | POA: Insufficient documentation

## 2023-11-27 DIAGNOSIS — R110 Nausea: Secondary | ICD-10-CM

## 2023-11-27 DIAGNOSIS — D72829 Elevated white blood cell count, unspecified: Secondary | ICD-10-CM | POA: Insufficient documentation

## 2023-11-27 LAB — BASIC METABOLIC PANEL WITH GFR
Anion gap: 11 (ref 5–15)
BUN: 20 mg/dL (ref 8–23)
CO2: 25 mmol/L (ref 22–32)
Calcium: 8.9 mg/dL (ref 8.9–10.3)
Chloride: 100 mmol/L (ref 98–111)
Creatinine, Ser: 0.78 mg/dL (ref 0.44–1.00)
GFR, Estimated: >60 mL/min (ref >60)
Glucose, Bld: 121 mg/dL — ABNORMAL HIGH (ref 70–99)
Potassium: 4.1 mmol/L (ref 3.5–5.1)
Sodium: 136 mmol/L (ref 135–145)

## 2023-11-27 LAB — URINALYSIS, ROUTINE W REFLEX MICROSCOPIC
Bilirubin Urine: NEGATIVE
Glucose, UA: NEGATIVE mg/dL
Hgb urine dipstick: NEGATIVE
Ketones, ur: NEGATIVE mg/dL
Leukocytes,Ua: NEGATIVE
Nitrite: NEGATIVE
Protein, ur: NEGATIVE mg/dL
Specific Gravity, Urine: 1.01 (ref 1.005–1.030)
pH: 7 (ref 5.0–8.0)

## 2023-11-27 LAB — HEPATIC FUNCTION PANEL
ALT: 23 U/L (ref 0–44)
AST: 24 U/L (ref 15–41)
Albumin: 3.6 g/dL (ref 3.5–5.0)
Alkaline Phosphatase: 66 U/L (ref 38–126)
Bilirubin, Direct: <0.1 mg/dL (ref 0.0–0.2)
Total Bilirubin: 0.7 mg/dL (ref 0.0–1.2)
Total Protein: 6.6 g/dL (ref 6.5–8.1)

## 2023-11-27 LAB — CBC
HCT: 37.7 % (ref 36.0–46.0)
Hemoglobin: 12.5 g/dL (ref 12.0–15.0)
MCH: 33.2 pg (ref 26.0–34.0)
MCHC: 33.2 g/dL (ref 30.0–36.0)
MCV: 100 fL (ref 80.0–100.0)
Platelets: 377 10*3/uL (ref 150–400)
RBC: 3.77 MIL/uL — ABNORMAL LOW (ref 3.87–5.11)
RDW: 12.6 % (ref 11.5–15.5)
WBC: 18.3 10*3/uL — ABNORMAL HIGH (ref 4.0–10.5)
nRBC: 0 % (ref 0.0–0.2)

## 2023-11-27 LAB — LIPASE, BLOOD: Lipase: 30 U/L (ref 11–51)

## 2023-11-27 LAB — TROPONIN I (HIGH SENSITIVITY): Troponin I (High Sensitivity): <2 ng/L (ref <18)

## 2023-11-27 LAB — MAGNESIUM: Magnesium: 2.2 mg/dL (ref 1.7–2.4)

## 2023-11-27 MED ORDER — LACTATED RINGERS IV BOLUS
1000.0000 mL | Freq: Once | INTRAVENOUS | Status: AC
  Administered 2023-11-27: 1000 mL via INTRAVENOUS

## 2023-11-27 MED ORDER — METOCLOPRAMIDE HCL 10 MG PO TABS: 10.0000 mg | ORAL_TABLET | Freq: Four times a day (QID) | ORAL | 0 refills | Status: DC

## 2023-11-27 MED ORDER — METOCLOPRAMIDE HCL 5 MG/ML IJ SOLN
10.0000 mg | Freq: Once | INTRAMUSCULAR | Status: AC
  Administered 2023-11-27: 10 mg via INTRAVENOUS
  Filled 2023-11-27: qty 2

## 2023-11-27 NOTE — ED Notes (Signed)
Lab draw unsuccessful 

## 2023-11-27 NOTE — ED Provider Notes (Signed)
 [place] EMERGENCY DEPARTMENT AT [facility] Provider Note   CSN: [id number] Arrival date & time: 11/27/23  7829     History  No chief complaint on file.   Alyssa Harper is a 63 y.o. female.  HPI Patient presented for nausea, p.o. intolerance, and transient episode of chest pain.  Medical history includes HLD, MVP, Sjogren's disease, pancreatic cancer.  Pancreatic cancer was initially diagnosed 5 years ago.  She underwent treatment at the time.  She had recurrence identified on CT imaging last month.  She was restarted on chemotherapy (NALIRIFOX) 2 days ago.  Currently, she is on a 72-hour infusion of 5-FU.  Since initiation of her chemotherapy, she has had worsening nausea.  She took Zofran  last night and Phenergan  this morning with no relief.  She has not had any vomiting but also has not been able to take in more than a few sips of water.  She had a sharp substernal chest pain prior to arrival that has since resolved.  Her cancer is managed at [facility].  Her oncologist is Dr. Nino Bass.    Home Medications Prior to Admission medications   Medication Sig Start Date End Date Taking? Authorizing Provider  metoCLOPramide (REGLAN) 10 MG tablet Take 1 tablet (10 mg total) by mouth every 6 (six) hours. 11/27/23  Yes [name], MD  ALPRAZolam  (XANAX ) 0.25 MG tablet 0.25 mg as needed.  07/05/18   [provider]  CREON 24000-76000 units CPEP TAKE 2 CAPSULES JUST BEFORE MEALS AND 1 CAPSULE JUST BEFORE SNACKS 09/05/18   [provider]  dexamethasone (DECADRON) 4 MG tablet Take 4 mg by mouth as directed. Take 2 tablets daily for 2 days after chemo 09/07/18   [provider]  lidocaine-prilocaine (EMLA) cream Apply 1 application topically as needed. 09/06/18   [provider]  loratadine (CLARITIN) 10 MG tablet  11/16/18   [provider]  ondansetron  (ZOFRAN -ODT) 4 MG disintegrating tablet Take 1 tablet (4 mg total) by mouth every 8 (eight) hours as  needed for nausea or vomiting. 10/13/23   Rexie Catena, PA-C  pantoprazole  (PROTONIX ) 40 MG tablet Take 1 tablet (40 mg total) by mouth daily. 09/13/18   Almeda Jacobs, MD  promethazine  (PHENERGAN ) 25 MG tablet Take 1 tablet (25 mg total) by mouth every 6 (six) hours as needed for nausea. 07/05/18   Mikhail, Maryann, DO  UDENYCA 6 MG/0.6ML injection  10/19/18   [provider]      Allergies    Tape and Penicillins    Review of Systems   Review of Systems  Constitutional:  Positive for fatigue.  Cardiovascular:  Positive for chest pain.  Gastrointestinal:  Positive for nausea.  Neurological:  Positive for weakness (Generalized).  All other systems reviewed and are negative.   Physical Exam Updated Vital Signs BP 120/61   Pulse 60   Temp 98.4 F (36.9 C) (Oral)   Resp 16   Ht 5\' 5"  (1.651 m)   Wt 52.2 kg   SpO2 95%   BMI 19.14 kg/m  Physical Exam Vitals and nursing note reviewed.  Constitutional:      General: She is not in acute distress.    Appearance: Normal appearance. She is well-developed. She is not ill-appearing, toxic-appearing or diaphoretic.  HENT:     Head: Normocephalic and atraumatic.     Right Ear: External ear normal.     Left Ear: External ear normal.     Nose: Nose normal.  Mouth/Throat:     Mouth: Mucous membranes are moist.  Eyes:     Extraocular Movements: Extraocular movements intact.     Conjunctiva/sclera: Conjunctivae normal.  Cardiovascular:     Rate and Rhythm: Normal rate and regular rhythm.  Pulmonary:     Effort: Pulmonary effort is normal. No respiratory distress.  Abdominal:     General: There is no distension.     Palpations: Abdomen is soft.     Tenderness: There is no abdominal tenderness.  Musculoskeletal:        General: No swelling.     Cervical back: Normal range of motion and neck supple.  Skin:    General: Skin is warm and dry.     Coloration: Skin is not jaundiced or pale.  Neurological:     General: No  focal deficit present.     Mental Status: She is alert and oriented to person, place, and time.  Psychiatric:        Mood and Affect: Mood normal.        Behavior: Behavior normal.     ED Results / Procedures / Treatments   Labs (all labs ordered are listed, but only abnormal results are displayed) Labs Reviewed  BASIC METABOLIC PANEL WITH GFR - Abnormal; Notable for the following components:      Result Value   Glucose, Bld 121 (*)    All other components within normal limits  CBC - Abnormal; Notable for the following components:   WBC 18.3 (*)    RBC 3.77 (*)    All other components within normal limits  URINALYSIS, ROUTINE W REFLEX MICROSCOPIC - Abnormal; Notable for the following components:   Color, Urine STRAW (*)    All other components within normal limits  MAGNESIUM  HEPATIC FUNCTION PANEL  LIPASE, BLOOD  TROPONIN I (HIGH SENSITIVITY)    EKG EKG Interpretation Date/Time:  Sunday November 27 2023 04:49:28 EDT Ventricular Rate:  68 PR Interval:  153 QRS Duration:  101 QT Interval:  402 QTC Calculation: 428 R Axis:   85  Text Interpretation: Sinus rhythm Borderline right axis deviation ST elevation, consider inferior injury Artifact in lead(s) I II III aVR aVL aVF Confirmed by [name] [phone]) on 11/27/2023 5:17:44 AM  Radiology DG Chest Port 1 View Result Date: 11/27/2023 CLINICAL DATA:  [id number] Chest pain [id number] EXAM: PORTABLE CHEST - 1 VIEW COMPARISON:  09/15/2018. FINDINGS: Cardiac silhouette is unremarkable. No pneumothorax or pleural effusion. The lungs are clear. The visualized skeletal structures are unremarkable. Right-sided Port-A-Cath tip overlies distal SVC. IMPRESSION: No acute cardiopulmonary process. Electronically Signed   By: Sydell Eva M.D.   On: 11/27/2023 07:07    Procedures Procedures    Medications Ordered in ED Medications  lactated ringers  bolus 1,000 mL (0 mLs Intravenous Stopped 11/27/23 0731)  metoCLOPramide (REGLAN) injection 10 mg  (10 mg Intravenous Given 11/27/23 0531)  lactated ringers  bolus 1,000 mL (1,000 mLs Intravenous New Bag/Given 11/27/23 4098)    ED Course/ Medical Decision Making/ A&P                                 Medical Decision Making Amount and/or Complexity of Data Reviewed Labs: ordered. Radiology: ordered.  Risk Prescription drug management.   This patient presents to the ED for concern of nausea, this involves an extensive number of treatment options, and is a complaint that carries with it a high risk of complications  and morbidity.  The differential diagnosis includes chemotherapy side effects, dehydration, metabolic derangements, gastritis, GERD, enteritis   Co morbidities / Chronic conditions that complicate the patient evaluation  HLD, MVP, Sjogren's disease, pancreatic cancer.   Additional history obtained:  Additional history obtained from EMR External records from outside source obtained and reviewed including patient's daughter   Lab Tests:  I Ordered, and personally interpreted labs.  The pertinent results include: Normal kidney function, normal electrolytes, normal hepatobiliary enzymes, normal lipase, normal troponin.  A leukocytosis is present.     Imaging Studies ordered:  I ordered imaging studies including chest x-ray I independently visualized and interpreted imaging which showed no acute findings I agree with the radiologist interpretation   Cardiac Monitoring: / EKG:  The patient was maintained on a cardiac monitor.  I personally viewed and interpreted the cardiac monitored which showed an underlying rhythm of: Sinus rhythm   Problem List / ED Course / Critical interventions / Medication management  Patient presented for intractable nausea following initiation of chemotherapy 2 days ago.  Vital signs on arrival notable for soft blood pressure.  On exam, patient appears uncomfortable.  Although she had some transient chest pain this evening, she denies any  current areas of pain.  EKG does not show any clear ST segment abnormalities.  Laboratory workup was initiated.  IV fluids were ordered for hydration.  Reglan was ordered for nausea.  Patient's lab work was notable for leukocytosis.  This may be stress demargination from her recent symptoms.  Other than her nausea, she has not had any recent infectious symptoms.  Initial plan was for CT imaging.  Patient and daughter declined this stating that she has had many recent CT scans.  At this point, patient does have improved symptoms.  Additional bolus of IV fluids was ordered.  Patient was given food and crackers for p.o. challenge.  Care of patient signed out to oncoming ED provider. I ordered medication including IV fluids for hydration, Reglan for nausea Reevaluation of the patient after these medicines showed that the patient improved I have reviewed the patients home medicines and have made adjustments as needed  Social Determinants of Health:  Has access to outpatient care        Final Clinical Impression(s) / ED Diagnoses Final diagnoses:  Nausea    Rx / DC Orders ED Discharge Orders          Ordered    metoCLOPramide (REGLAN) 10 MG tablet  Every 6 hours        11/27/23 0830              [name], MD 11/27/23 [phone]

## 2023-11-27 NOTE — Discharge Instructions (Addendum)
 A prescription for medication called metoclopramide was sent to your pharmacy.  This is an option for you to take at home for treatment of nausea.  Drink as much fluids as you can to stay hydrated.  Follow-up with your outpatient providers.  Return to the emergency department for any new or worsening symptoms of concern.

## 2023-11-27 NOTE — ED Provider Triage Note (Addendum)
 Emergency Medicine Provider Triage Evaluation Note  Alyssa Harper , a 62 y.o. female  was evaluated in triage.  Pt complains of chest pain, nausea.  Review of Systems  Positive: Nausea, p.o. intolerance, chest pain, fatigue, generalized weakness Negative: Shortness of breath, fevers, chills  Physical Exam  BP 99/71 (BP Location: Right Arm)   Pulse 65   Temp 98.4 F (36.9 C) (Oral)   Resp 15   Ht 5\' 5"  (1.651 m)   Wt 52.2 kg   SpO2 98%   BMI 19.14 kg/m  Gen:   Awake, no distress   Resp:  Normal effort  MSK:   Moves extremities without difficulty   Medical Decision Making  Medically screening exam initiated at 5:15 AM.  Appropriate orders placed.  Alyssa Harper was informed that the remainder of the evaluation will be completed by another provider, this initial triage assessment does not replace that evaluation, and the importance of remaining in the ED until their evaluation is complete.  Patient presenting for nausea and p.o. intolerance.  She is followed by [facility] for pancreatic cancer.  She was initially treated for this 5 years ago.  She had recurrence last month.  2 days ago, she was started on a new chemotherapy regimen.  She is currently on a 72-hour 5-FU infusion.  Since the start of her new regimen, she has had persistent nausea, refractory to home Phenergan  and Zofran .  Phenergan  was given 3 hours prior to arrival.  She has only been able to tolerate a few sips of water.  She denies vomiting.  This evening, she had sharp pains in her chest.  These are currently resolved.   Alyssa Mariner, MD 11/27/23 8295    Alyssa Mariner, MD 11/27/23 [phone]

## 2023-11-27 NOTE — ED Notes (Signed)
 Introduced self to pt and daughter. Pt attempting to rest with wash cloth over eyes. Offered comfort measures. Discussed port for CT angio and daughter asked about discontinuation of medication running through port from cancer center. Sent message to MD. Turned out lights for pt. Call light within reach.

## 2023-11-27 NOTE — ED Triage Notes (Addendum)
 Patient is receiving chemo for pancreatic cancer (currently infusing through port) Started having chest a pain 0245. Patient was also feeling nauseous and took her home nausea medication with no relief. Patient states she get dehydrated quickly.   Patient reports her chest pain is in center of her chest and radiates down. Denies pain at this time. No blood thinners.   Patient would like to discontinue her Chemo infusion due to the way it makes her feel. Infusion was started on Friday.

## 2023-11-27 NOTE — ED Notes (Signed)
 5FU medication stopped from port ok per Dr. Kermit Ped. Port cap placed on end.

## 2023-11-28 ENCOUNTER — Telehealth: Payer: Self-pay

## 2023-11-28 NOTE — Telephone Encounter (Signed)
 The patient initiated chemotherapy with NALIRIFOX on Friday. She is currently receiving a 72-hour infusion of 5-FU. Since starting treatment, she has experienced worsening nausea. She visited the emergency department and had her 5-FU pump discontinued. The patient's husband submitted a request for intravenous fluids due to her decreased oral intake. I recommend that the patient follow up with [facility] for further evaluation and management, as she is not a est patient with the provider.

## 2023-11-29 ENCOUNTER — Encounter (HOSPITAL_COMMUNITY): Payer: Self-pay

## 2023-11-29 ENCOUNTER — Inpatient Hospital Stay (HOSPITAL_COMMUNITY)
Admission: AD | Admit: 2023-11-29 | Discharge: 2023-12-01 | DRG: 392 | Disposition: A | Discharge status: Home / Self Care | Source: Ambulatory Visit | Attending: Internal Medicine | Admitting: Internal Medicine

## 2023-11-29 ENCOUNTER — Other Ambulatory Visit: Payer: Self-pay

## 2023-11-29 ENCOUNTER — Inpatient Hospital Stay: Attending: Hematology and Oncology

## 2023-11-29 ENCOUNTER — Inpatient Hospital Stay (HOSPITAL_BASED_OUTPATIENT_CLINIC_OR_DEPARTMENT_OTHER): Admitting: Physician Assistant

## 2023-11-29 ENCOUNTER — Encounter (HOSPITAL_COMMUNITY): Payer: Self-pay | Admitting: Internal Medicine

## 2023-11-29 VITALS — BP 123/67 | HR 62 | Temp 98.6°F | Resp 16

## 2023-11-29 DIAGNOSIS — R112 Nausea with vomiting, unspecified: Secondary | ICD-10-CM | POA: Insufficient documentation

## 2023-11-29 DIAGNOSIS — C253 Malignant neoplasm of pancreatic duct: Secondary | ICD-10-CM | POA: Diagnosis present

## 2023-11-29 DIAGNOSIS — C259 Malignant neoplasm of pancreas, unspecified: Secondary | ICD-10-CM

## 2023-11-29 DIAGNOSIS — Z515 Encounter for palliative care: Secondary | ICD-10-CM

## 2023-11-29 DIAGNOSIS — K219 Gastro-esophageal reflux disease without esophagitis: Secondary | ICD-10-CM | POA: Diagnosis present

## 2023-11-29 DIAGNOSIS — T451X5A Adverse effect of antineoplastic and immunosuppressive drugs, initial encounter: Secondary | ICD-10-CM | POA: Diagnosis not present

## 2023-11-29 DIAGNOSIS — E7800 Pure hypercholesterolemia, unspecified: Secondary | ICD-10-CM | POA: Diagnosis present

## 2023-11-29 DIAGNOSIS — K5900 Constipation, unspecified: Secondary | ICD-10-CM | POA: Diagnosis present

## 2023-11-29 DIAGNOSIS — Z91048 Other nonmedicinal substance allergy status: Secondary | ICD-10-CM

## 2023-11-29 DIAGNOSIS — D630 Anemia in neoplastic disease: Secondary | ICD-10-CM | POA: Diagnosis present

## 2023-11-29 DIAGNOSIS — Z9081 Acquired absence of spleen: Secondary | ICD-10-CM

## 2023-11-29 DIAGNOSIS — Z452 Encounter for adjustment and management of vascular access device: Secondary | ICD-10-CM | POA: Insufficient documentation

## 2023-11-29 DIAGNOSIS — Z880 Allergy status to penicillin: Secondary | ICD-10-CM

## 2023-11-29 DIAGNOSIS — R1110 Vomiting, unspecified: Secondary | ICD-10-CM | POA: Diagnosis present

## 2023-11-29 DIAGNOSIS — C251 Malignant neoplasm of body of pancreas: Secondary | ICD-10-CM | POA: Insufficient documentation

## 2023-11-29 DIAGNOSIS — Z5189 Encounter for other specified aftercare: Secondary | ICD-10-CM | POA: Insufficient documentation

## 2023-11-29 DIAGNOSIS — I10 Essential (primary) hypertension: Secondary | ICD-10-CM | POA: Diagnosis present

## 2023-11-29 DIAGNOSIS — Z800 Family history of malignant neoplasm of digestive organs: Secondary | ICD-10-CM

## 2023-11-29 DIAGNOSIS — Z5111 Encounter for antineoplastic chemotherapy: Secondary | ICD-10-CM | POA: Insufficient documentation

## 2023-11-29 DIAGNOSIS — M810 Age-related osteoporosis without current pathological fracture: Secondary | ICD-10-CM | POA: Insufficient documentation

## 2023-11-29 DIAGNOSIS — M3500 Sicca syndrome, unspecified: Secondary | ICD-10-CM | POA: Diagnosis present

## 2023-11-29 DIAGNOSIS — R197 Diarrhea, unspecified: Secondary | ICD-10-CM | POA: Insufficient documentation

## 2023-11-29 DIAGNOSIS — Z90411 Acquired partial absence of pancreas: Secondary | ICD-10-CM

## 2023-11-29 DIAGNOSIS — Z808 Family history of malignant neoplasm of other organs or systems: Secondary | ICD-10-CM

## 2023-11-29 DIAGNOSIS — Z888 Allergy status to other drugs, medicaments and biological substances status: Secondary | ICD-10-CM

## 2023-11-29 LAB — CMP (CANCER CENTER ONLY)
ALT: 16 U/L (ref 0–44)
AST: 13 U/L — ABNORMAL LOW (ref 15–41)
Albumin: 3.4 g/dL — ABNORMAL LOW (ref 3.5–5.0)
Alkaline Phosphatase: 58 U/L (ref 38–126)
Anion gap: 6 (ref 5–15)
BUN: 17 mg/dL (ref 8–23)
CO2: 30 mmol/L (ref 22–32)
Calcium: 8.1 mg/dL — ABNORMAL LOW (ref 8.9–10.3)
Chloride: 102 mmol/L (ref 98–111)
Creatinine: 0.62 mg/dL (ref 0.44–1.00)
GFR, Estimated: >60 mL/min (ref >60)
Glucose, Bld: 102 mg/dL — ABNORMAL HIGH (ref 70–99)
Potassium: 3.5 mmol/L (ref 3.5–5.1)
Sodium: 138 mmol/L (ref 135–145)
Total Bilirubin: 0.6 mg/dL (ref 0.0–1.2)
Total Protein: 5.9 g/dL — ABNORMAL LOW (ref 6.5–8.1)

## 2023-11-29 LAB — CBC WITH DIFFERENTIAL (CANCER CENTER ONLY)
Abs Immature Granulocytes: 0.05 10*3/uL (ref 0.00–0.07)
Basophils Absolute: 0 10*3/uL (ref 0.0–0.1)
Basophils Relative: 0 %
Eosinophils Absolute: 0 10*3/uL (ref 0.0–0.5)
Eosinophils Relative: 0 %
HCT: 33.9 % — ABNORMAL LOW (ref 36.0–46.0)
Hemoglobin: 11.9 g/dL — ABNORMAL LOW (ref 12.0–15.0)
Immature Granulocytes: 0 %
Lymphocytes Relative: 18 %
Lymphs Abs: 2.4 10*3/uL (ref 0.7–4.0)
MCH: 33 pg (ref 26.0–34.0)
MCHC: 35.1 g/dL (ref 30.0–36.0)
MCV: 93.9 fL (ref 80.0–100.0)
Monocytes Absolute: 0.8 10*3/uL (ref 0.1–1.0)
Monocytes Relative: 6 %
Neutro Abs: 10.1 10*3/uL — ABNORMAL HIGH (ref 1.7–7.7)
Neutrophils Relative %: 76 %
Platelet Count: 304 10*3/uL (ref 150–400)
RBC: 3.61 MIL/uL — ABNORMAL LOW (ref 3.87–5.11)
RDW: 11.8 % (ref 11.5–15.5)
WBC Count: 13.3 10*3/uL — ABNORMAL HIGH (ref 4.0–10.5)
nRBC: 0 % (ref 0.0–0.2)

## 2023-11-29 LAB — MAGNESIUM: Magnesium: 2 mg/dL (ref 1.7–2.4)

## 2023-11-29 MED ORDER — ACETAMINOPHEN 650 MG RE SUPP: 650.0000 mg | Freq: Four times a day (QID) | RECTAL | Status: DC | PRN

## 2023-11-29 MED ORDER — PANTOPRAZOLE SODIUM 40 MG IV SOLR
40.0000 mg | INTRAVENOUS | Status: DC
  Administered 2023-11-30: 40 mg via INTRAVENOUS
  Filled 2023-11-29: qty 10

## 2023-11-29 MED ORDER — SODIUM CHLORIDE 0.9 % IV SOLN
12.5000 mg | Freq: Four times a day (QID) | INTRAVENOUS | Status: DC | PRN
  Administered 2023-11-29 – 2023-12-01 (×3): 12.5 mg via INTRAVENOUS
  Filled 2023-11-29 (×3): qty 12.5

## 2023-11-29 MED ORDER — CALCIUM CARBONATE ANTACID 500 MG PO CHEW
1.0000 | CHEWABLE_TABLET | Freq: Three times a day (TID) | ORAL | Status: DC
  Administered 2023-11-29 – 2023-11-30 (×2): 200 mg via ORAL
  Filled 2023-11-29 (×2): qty 1

## 2023-11-29 MED ORDER — ENOXAPARIN SODIUM 40 MG/0.4ML IJ SOSY
40.0000 mg | PREFILLED_SYRINGE | INTRAMUSCULAR | Status: DC
  Filled 2023-11-29: qty 0.4

## 2023-11-29 MED ORDER — ALBUTEROL SULFATE (2.5 MG/3ML) 0.083% IN NEBU: 2.5000 mg | INHALATION_SOLUTION | RESPIRATORY_TRACT | Status: DC | PRN

## 2023-11-29 MED ORDER — ONDANSETRON HCL 4 MG PO TABS: 4.0000 mg | ORAL_TABLET | Freq: Four times a day (QID) | ORAL | Status: DC | PRN

## 2023-11-29 MED ORDER — HYDROMORPHONE HCL 1 MG/ML IJ SOLN: 0.5000 mg | INTRAMUSCULAR | Status: DC | PRN

## 2023-11-29 MED ORDER — FAMOTIDINE IN NACL 20-0.9 MG/50ML-% IV SOLN
20.0000 mg | Freq: Once | INTRAVENOUS | Status: AC
  Administered 2023-11-29: 20 mg via INTRAVENOUS
  Filled 2023-11-29: qty 50

## 2023-11-29 MED ORDER — SODIUM CHLORIDE 0.9 % IV SOLN
6.2500 mg | Freq: Once | INTRAVENOUS | Status: DC
  Filled 2023-11-29: qty 0.25

## 2023-11-29 MED ORDER — ONDANSETRON HCL 4 MG/2ML IJ SOLN
4.0000 mg | Freq: Four times a day (QID) | INTRAMUSCULAR | Status: DC | PRN
  Administered 2023-11-29 – 2023-11-30 (×4): 4 mg via INTRAVENOUS
  Filled 2023-11-29 (×4): qty 2

## 2023-11-29 MED ORDER — SODIUM CHLORIDE 0.9 % IV SOLN
6.2500 mg | Freq: Once | INTRAVENOUS | Status: AC
  Administered 2023-11-29: 6.25 mg via INTRAVENOUS
  Filled 2023-11-29: qty 0.25

## 2023-11-29 MED ORDER — SODIUM CHLORIDE 0.9 % IV SOLN: INTRAVENOUS | Status: DC

## 2023-11-29 MED ORDER — TRAZODONE HCL 50 MG PO TABS: 25.0000 mg | ORAL_TABLET | Freq: Every evening | ORAL | Status: DC | PRN

## 2023-11-29 MED ORDER — OXYCODONE HCL 5 MG PO TABS: 5.0000 mg | ORAL_TABLET | ORAL | Status: DC | PRN

## 2023-11-29 MED ORDER — ONDANSETRON HCL 4 MG/2ML IJ SOLN
8.0000 mg | Freq: Once | INTRAMUSCULAR | Status: AC
  Administered 2023-11-29: 8 mg via INTRAVENOUS
  Filled 2023-11-29: qty 4

## 2023-11-29 MED ORDER — ACETAMINOPHEN 325 MG PO TABS: 650.0000 mg | ORAL_TABLET | Freq: Four times a day (QID) | ORAL | Status: DC | PRN

## 2023-11-29 NOTE — Progress Notes (Signed)
 Symptom Management Consult Note [facility]    Patient Care Team: Tisovec, Kristina Pfeiffer, MD as PCP - General (Internal Medicine) [facility])    Name / MRN / DOBLorena Harper  [id number]  11/26/1960   Date of visit: 11/29/2023   Chief Complaint/Reason for visit: nausea and vomiting   Current Therapy: NALIRIFOX with pegfilgrastim  Last treatment:  Day 1   Cycle 1 on 11/25/23    ASSESSMENT AND PLAN Patient is a 63 y.o. 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  I have viewed most recent oncology note and lab work.  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 She is scheduled to establish care with Dr. Scherrie Curt on 12/06/23. - Next appointment with oncologist is 6/17 - Palliative care consult placed per patient's request to assist with long term symptom management.   #Symptom management: nausea, vomiting, diarrhea, fatigue - Recent ED visit on 11/27/23 for the same. Reviewed ED note. - Patient noted to be hypotensive on arrival while actively vomiting. BP rechecked and is normotensive. Initial abdominal exam with tenderness to epigastric and LUQ. Serial abdominal exams are benign. - Patient ill appearing although non toxic. Abdominal tenderness with guarding to epigastric and left upper quadrant. - Patient received 1250 ml NS, 8 mg IV zofran , 20 mg IV pepcid , 6.25 mg IV phenergan  in clinic for symptom management. - CBC with WBC 13.3 likely related to current steroid use. CMP without significant electrolyte derangement or renal insufficiency. - On reassessment patient continues to have difficulty tolerating PO intake despite administering multiple antiemetics. Given this is patient's second visit in x 2 days for similar symptoms I have concern for patient's ability to tolerate PO intake at home and  she agrees for hospital admission for ongoing care.  Spoke with Dr. Samtani with hospitalist service who agrees to assume care of patient and bring into the hospital for further evaluation and management.  Appreciate his assistance with this admission. Also notified on call oncology provider Dr. Scherrie Curt of admission.     HEME/ONC HISTORY Oncology History  Pancreatic adenocarcinoma (HCC)  07/03/2018 Imaging   MRCP 1. Abrupt caliber change of the pancreatic duct in the mid pancreas associated with an subtle ovoid lesion. Proximal to this mid pancreatic lesion the duct is significantly dilated and distally the duct is normal. Concern for pancreatic neoplasm. Recommend endoscopic ultrasound with tissue sampling.   2. Enhancing lesion in the subcapsular LEFT lateral hepatic lobe is indeterminate but favored small focal nodular hyperplasia or other benign lesion. Metastatic disease is not favored. Additional benign hepatic cysts within the liver.   3.   no evidence of metastatic adenopathy   07/03/2018 Imaging   Ct angiogram of chest and abdominal vessels: Chest CTA impression:   1. No acute cardiopulmonary disease. Specifically, no evidence of thoracic aortic aneurysm or dissection. No evidence of central pulmonary embolism.   Abdomen pelvis CTA impression:   1. No acute findings within the abdomen or pelvis. Specifically, no evidence of abdominal aortic aneurysm, dissection or mesenteric ischemia. 2. Pancreatic ductal dilatation with questioned approximately 6.0 cm hypoattenuating mass involving the proximal body of pancreas, incompletely evaluated on this arterial phase examination. Further evaluation with MRCP is advised. 3. Indeterminate approximately 1.2 cm hyperenhancing lesion within the subcapsular aspect of the left lobe of the liver, potentially representative of a perfusion  anomaly or flash filling hemangioma, though incompletely characterized on the present examination.  This lesion may also be further evaluated at the time of the recommended MRCP.   07/03/2018 Tumor Marker   Patient's tumor was tested for the following markers: CA-199 Results of the tumor marker test revealed 19   07/03/2018 - 07/05/2018 Hospital Admission   She was admitted to the hospital due to acute lower back pain and was diagnosed with acute pancreatitis.  Further evaluation detected abnormal lesion in the pancreas.  Further EUS and biopsy confirmed adenocarcinoma of the head of the pancreas   07/05/2018 Pathology Results   FINE NEEDLE ASPIRATION, ENDOSCOPIC, PANCREAS BODY(SPECIMEN 1 OF 1 COLLECTED 07/05/18): MALIGNANT CELLS PRESENT, CONSISTENT WITH ADENOCARCINOMA.  THERE IS LIKELY INSUFFICIENT MATERIAL FOR ADDITIONAL STUDIES   07/05/2018 Procedure   EUS report No lymphadenopathy seen. There was no sign of significant endosonographic abnormality in the left lobe of the liver. No focal pathology was identified. There was no sign of significant endosonographic abnormality in the common bile duct. An unremarkable gallbladder was identified. A round mass was identified in the pancreatic body. The mass was isoechoic. The mass measured 15 mm by 19 mm in maximal cross-sectional diameter. The outer margins were smooth. An intact interface was seen between the mass and the superior mesenteric artery and celiac artery, suggesting a lack of invasion. No obvious venous vascular involvement. The remainder of the pancreas was examined. The endosonographic appearance of parenchyma and the upstream pancreatic duct indicated duct dilation.  Fine needle aspiration for cytology was performed. Color Doppler imaging was utilized prior to needle puncture to confirm a lack of significant vascular structures within the needle path. Four passes were made with the 25 gauge needle using a transgastric approach. A stylet was used. A cytotechnologist was present to evaluate the adequacy of the specimen. The cellularity  of the specimen was adequate. Final cytology results are pending. Estimated blood loss: none.  Summary A round mass was identified in the pancreatic body. The mass was isoechoic. The mass measured 15 mm by 19 mm in maximal cross-sectional diameter. The outer margins were smooth. An intact interface was seen between the mass and the superior mesenteric artery and celiac artery, suggesting a lack of invasion. No obvious venous vascular involvement. The remainder of the pancreas was examined. The endosonographic appearance of parenchyma and the upstream pancreatic duct indicated duct dilation.  Fine needle aspiration for cytology was performed. Color Doppler imaging was utilized prior to needle puncture to confirm a lack of significant vascular structures within the needle path. Four passes were made with the 25 gauge needle using a transgastric approach. A stylet was used. A cytotechnologist was present to evaluate the adequacy of the specimen. The cellularity of the specimen was adequate. Final cytology results are pending. Estimated blood loss: none.  Impression - There was no evidence of significant pathology in the left lobe of the liver. - There was no sign of significant pathology in the common bile duct. - A mass was identified in the pancreatic body. Fine needle aspiration performed.   07/07/2018 Cancer Staging   Staging form: Exocrine Pancreas, AJCC 8th Edition - Clinical stage from 07/07/2018: Stage IA (cT1c, cN0, cM0) - Signed by Almeda Jacobs, MD on 07/07/2018   07/14/2018 Imaging   Ct scan of abdomen and pelvis Baseline Ct done in [place]   07/22/2018 Surgery   She had surgery in [facility] by Dr. William Jarnagin  Procedure: Laparoscopic distal pancreatectomy, laparoscopic splenectomy, explorotomy  celiotomy, biopsies and LN removal   07/22/2018 Pathology Results   Adenocarcinoma of pancreas with 80% mucinous component, moderately differentiated, located on the body of pancreas, 1.8  cm greatest dimension, negative margin. No lymphovascular invasion; perineural invasion seen. Closest margin 0.7 cm proximal margin None of 16 LN were involved   07/28/2018 Imaging   Ct scan of abdomen and pelvis in [place] Fluid collection noted near the resection area.   07/31/2018 Procedure   She underwent EUS which revealed a cystic lesion in the pancreatic tail. Cystogastrostomy was performed   09/07/2018 -  Chemotherapy   She received chemotherapy at [facility] with folfirinox with 20% reduction of each chemo        INTERVAL HISTORY  Discussed the use of AI scribe software for clinical note transcription with the patient, who gave verbal consent to proceed.    Alyssa Harper is a 63 y.o. female with oncologic history as above presenting to [facility] today with chief complaint of nausea and vomiting. She is accompanied by multiple family members who provide additional history.  She recently restarted chemotherapy at [facility] and she had 72 hour 5-FU pump.   She was scheduled today for an RN visit here at the cancer center this morning to have her port accessed prior to home health to administering IVF. Unfortunately, she feels unwell, requiring fluids urgently, and had a particularly bad night prior to the visit.  Since her pump was removed x 2 days ago then, she has experienced significant nausea, pain, dizziness, fatigue, and weakness. She has not been able to consume a substantial meal since before her treatment on Friday, only managing small bites of yogurt and cottage cheese due to a lack of appetite and a sensation of her stomach being like a 'volcano'.   She takes Pepcid  for acid reflux, with the last dose taken yesterday. She has experienced vomiting, with two to three episodes in the last 24 hours. She had 2 episodes diarrhea day of symptom onset that was controlled with Imodium, and no bowel movements since then, though she occasionally passes gas. Pain has been worse than usual, and  she dislikes opioids due to dizziness and nausea. She took half an oxycodone  on an empty stomach this morning, which led to vomiting shortly after.  She tries to manage her pain with half doses of maximum strength Tylenol , which she tolerates with small bites of food. No fever but experiences chills. She has consumed approximately 12 to 20 ounces of fluids in the last 24 hours. For nausea, she takes Zofran , which has been the most effective, and promethazine , which causes dizziness and drowsiness. She took Zofran  at 2 AM and promethazine  at 6:40 AM today.   In 2020, she had difficulty with chemotherapy, experiencing dehydration and nausea, which improved with a reduced dose.   ROS  All other systems are reviewed and are negative for acute change except as noted in the HPI.    Allergies  Allergen Reactions   Tape Dermatitis    IV3000 DRESSING FOR PORT ACCESS - Tegaderm cause irritation to the surrounding skin   Penicillins      Past Medical History:  Diagnosis Date   Family history of adverse reaction to anesthesia    "grandmother stopped breathing"   Headache    "weekly" (07/03/2018)   High cholesterol    Mitral valve prolapse    Pancreatic mass 07/03/2018   Sjogren's disease [facility])      Past Surgical History:  Procedure Laterality Date  AUGMENTATION MAMMAPLASTY Bilateral 2013   ESOPHAGOGASTRODUODENOSCOPY N/A 07/05/2018   Procedure: ESOPHAGOGASTRODUODENOSCOPY (EGD);  Surgeon: Evangeline Hilts, MD;  Location: [facility];  Service: Endoscopy;  Laterality: N/A;   EUS N/A 07/05/2018   Procedure: UPPER ENDOSCOPIC ULTRASOUND (EUS) LINEAR and Radial;  Surgeon: Evangeline Hilts, MD;  Location: [facility];  Service: Endoscopy;  Laterality: N/A;   FINE NEEDLE ASPIRATION N/A 07/05/2018   Procedure: FINE NEEDLE ASPIRATION (FNA) LINEAR;  Surgeon: Evangeline Hilts, MD;  Location: [facility] ENDOSCOPY;  Service: Endoscopy;  Laterality: N/A;    Social History   Socioeconomic History   Marital  status: Married    Spouse name: Not on file   Number of children: Not on file   Years of education: Not on file   Highest education level: Not on file  Occupational History   Not on file  Tobacco Use   Smoking status: Never   Smokeless tobacco: Never  Vaping Use   Vaping status: Never Used  Substance and Sexual Activity   Alcohol use: Yes    Alcohol/week: 7.0 standard drinks of alcohol    Types: 7 Standard drinks or equivalent per week    Comment: glass of wine per night   Drug use: Never   Sexual activity: Yes  Other Topics Concern   Not on file  Social History Narrative   ** Merged History Encounter **       Social Drivers of Health   Financial Resource Strain: Not At Risk (11/08/2023)   Received from [facility]   Financial Resource Strain    I worry about the financial problems I will have in the future as a result of my illness or treatment.: Somewhat    I am satisfied with my current financial situation.: Somewhat  Food Insecurity: No Food Insecurity (11/08/2023)   Received from [facility]   Hunger Vital Sign    Worried About Running Out of Food in the Last Year: Never true    Ran Out of Food in the Last Year: Never true  Transportation Needs: No Transportation Needs (11/08/2023)   Received from [facility]   PRAPARE - Transportation    Lack of Transportation (Medical): No    Lack of Transportation (Non-Medical): No  Physical Activity: Not on file  Stress: Not on file  Social Connections: Unknown (04/15/2023)   Received from [facility]   Social Network    Social Network: Not on file  Intimate Partner Violence: Unknown (04/15/2023)   Received from [facility]   HITS    Physically Hurt: Not on file    Insult or Talk Down To: Not on file    Threaten Physical Harm: Not on file    Scream or Curse: Not on file    No family history on file.   Current Outpatient Medications:     ALPRAZolam  (XANAX ) 0.25 MG tablet, 0.25 mg as needed. , Disp: , Rfl:    CREON 24000-76000 units CPEP, TAKE 2 CAPSULES JUST BEFORE MEALS AND 1 CAPSULE JUST BEFORE SNACKS, Disp: , Rfl:    dexamethasone (DECADRON) 4 MG tablet, Take 4 mg by mouth as directed. Take 2 tablets daily for 2 days after chemo, Disp: , Rfl:    lidocaine-prilocaine (EMLA) cream, Apply 1 application topically as needed., Disp: , Rfl:    loratadine (CLARITIN) 10 MG tablet, , Disp: , Rfl:    metoCLOPramide (REGLAN) 10 MG tablet, Take 1 tablet (10 mg total) by mouth every 6 (  six) hours., Disp: 20 tablet, Rfl: 0   ondansetron  (ZOFRAN -ODT) 4 MG disintegrating tablet, Take 1 tablet (4 mg total) by mouth every 8 (eight) hours as needed for nausea or vomiting., Disp: 20 tablet, Rfl: 0   pantoprazole  (PROTONIX ) 40 MG tablet, Take 1 tablet (40 mg total) by mouth daily., Disp: 90 tablet, Rfl: 1   promethazine  (PHENERGAN ) 25 MG tablet, Take 1 tablet (25 mg total) by mouth every 6 (six) hours as needed for nausea., Disp: 30 tablet, Rfl: 0   UDENYCA 6 MG/0.6ML injection, , Disp: , Rfl:  No current facility-administered medications for this visit.  Facility-Administered Medications Ordered in Other Visits:    0.9 %  sodium chloride  infusion, , Intravenous, Continuous, Pickenpack-Cousar, Athena N, NP, Last Rate: 999 mL/hr at 11/29/23 0904, New Bag at 11/29/23 0904   promethazine  (PHENERGAN ) 6.25 mg in sodium chloride  0.9 % 50 mL IVPB, 6.25 mg, Intravenous, Once, Walisiewicz, Davina Howlett E, PA-C  PHYSICAL EXAM ECOG FS:1 - Symptomatic but completely ambulatory    Vitals:   11/29/23 0900 11/29/23 0908 11/29/23 1059 11/29/23 1203  BP: (!) 65/47 122/83 (!) 110/59 123/67  Pulse: 92  62 62  Resp: 18  16 16   Temp: 98.1 F (36.7 C)  98.6 F (37 C)   TempSrc: Temporal  Oral   SpO2: 98%  99% 99%   Physical Exam Vitals and nursing note reviewed.  Constitutional:      General: She is not in acute distress.    Appearance: She is  ill-appearing. She is not toxic-appearing or diaphoretic.     Comments: Thin appearing femal  HENT:     Head: Normocephalic.     Mouth/Throat:     Mouth: Mucous membranes are dry.  Eyes:     Conjunctiva/sclera: Conjunctivae normal.  Cardiovascular:     Rate and Rhythm: Normal rate and regular rhythm.     Pulses: Normal pulses.     Heart sounds: Normal heart sounds.  Pulmonary:     Effort: Pulmonary effort is normal.     Breath sounds: Normal breath sounds.  Abdominal:     General: Bowel sounds are normal. There is no distension.     Palpations: Abdomen is soft.     Tenderness: There is abdominal tenderness (epigastric + LUQ with guarding).  Musculoskeletal:     Cervical back: Normal range of motion.  Skin:    General: Skin is warm and dry.  Neurological:     Mental Status: She is alert.        LABORATORY DATA I have reviewed the data as listed    Latest Ref Rng & Units 11/29/2023   10:28 AM 11/27/2023    5:27 AM 10/13/2023    5:21 PM  CBC  WBC 4.0 - 10.5 K/uL 13.3  18.3  6.0   Hemoglobin 12.0 - 15.0 g/dL 16.1  09.6  04.5   Hematocrit 36.0 - 46.0 % 33.9  37.7  41.9   Platelets 150 - 400 K/uL 304  377  420         Latest Ref Rng & Units 11/29/2023   10:28 AM 11/27/2023    5:27 AM 10/13/2023    5:21 PM  CMP  Glucose 70 - 99 mg/dL 409  811  914   BUN 8 - 23 mg/dL 17  20  12    Creatinine 0.44 - 1.00 mg/dL 7.82  9.56  2.13   Sodium 135 - 145 mmol/L 138  136  140   Potassium 3.5 -  5.1 mmol/L 3.5  4.1  4.0   Chloride 98 - 111 mmol/L 102  100  96   CO2 22 - 32 mmol/L 30  25  32   Calcium 8.9 - 10.3 mg/dL 8.1  8.9  47.8   Total Protein 6.5 - 8.1 g/dL 5.9  6.6  7.7   Total Bilirubin 0.0 - 1.2 mg/dL 0.6  0.7  0.5   Alkaline Phos 38 - 126 U/L 58  66  93   AST 15 - 41 U/L 13  24  24    ALT 0 - 44 U/L 16  23  15         RADIOGRAPHIC STUDIES (from last 24 hours if applicable) I have personally reviewed the radiological images as listed and agreed with the findings in the  report. No results found.      Visit Diagnosis: 1. Pancreatic adenocarcinoma (HCC)   2. Intractable nausea and vomiting      Orders Placed This Encounter  Procedures   Amb Referral to Palliative Care    Referral Priority:   Urgent    Referral Type:   Consultation    Referral Reason:   Symptom Managment    Referred to Provider:   Jeb Miner, NP    Number of Visits Requested:   1    All questions were answered. The patient knows to call the clinic with any problems, questions or concerns. No barriers to learning was detected.  A total of more than 40 minutes were spent on this encounter with face-to-face time and non-face-to-face time, including preparing to see the patient, ordering tests and/or medications, counseling the patient and coordination of care as outlined above.    Thank you for allowing me to participate in the care of this patient.    Lillyan Hitson E  Walisiewicz, PA-C Department of Hematology/Oncology [facility] at [facility] [place] Phone: [phone]  [phone]    11/29/2023 2:19 PM

## 2023-11-29 NOTE — Progress Notes (Signed)
 Orders placed per Landa Pine, NP also pt to remain accessed for home health use. Home health will be de-accessing port, pt and family aware to call back with any questions.

## 2023-11-29 NOTE — Patient Instructions (Signed)
 Nausea and Vomiting, Adult Nausea is feeling that you have an upset stomach and that you are about to vomit. Vomiting is when food in your stomach forcefully comes out of your mouth. Vomiting can make you feel weak. If you vomit, or if you are not able to drink enough fluids, you may not have enough water in your body (get dehydrated). If you do not have enough water in your body, you may: Feel tired. Feel thirsty. Have a dry mouth. Have cracked lips. Pee (urinate) less often. Older adults and people with other diseases or a weak body defense system (immune system) are at higher risk for not having enough water in the body. If you feel like you may vomit or you vomit, it is important to follow instructions from your doctor about how to take care of yourself. Follow these instructions at home: Watch your symptoms for any changes. Tell your doctor about them. Eating and drinking     Take an ORS (oral rehydration solution). This is a drink that is sold at pharmacies and stores. Drink clear fluids in small amounts as you are able, such as: Water. Ice chips. Fruit juice that has water added (diluted fruit juice). Low-calorie sports drinks. Eat bland, easy-to-digest foods in small amounts as you are able, such as: Bananas. Applesauce. Rice. Low-fat (lean) meats. Toast. Crackers. Avoid drinking fluids that have a lot of sugar or caffeine in them. This includes energy drinks, sports drinks, and soda. Avoid alcohol. Avoid spicy or fatty foods. General instructions Take over-the-counter and prescription medicines only as told by your doctor. Drink enough fluid to keep your pee (urine) pale yellow. Wash your hands often with soap and water for at least 20 seconds. If you cannot use soap and water, use hand sanitizer. Make sure that everyone in your home washes their hands well and often. Rest at home until you feel better. Watch your condition for any changes. Take slow and deep breaths  when you feel like you may vomit. Keep all follow-up visits. Contact a doctor if: Your symptoms get worse. You have new symptoms. You have a fever. You cannot drink fluids without vomiting. You feel like you may vomit for more than 2 days. You feel light-headed or dizzy. You have a headache. You have muscle cramps. You have a rash. You have pain while peeing. Get help right away if: You have pain in your chest, neck, arm, or jaw. You feel very weak or you faint. You vomit again and again. You have vomit that is bright red or looks like black coffee grounds. You have bloody or black poop (stools) or poop that looks like tar. You have a very bad headache, a stiff neck, or both. You have very bad pain, cramping, or bloating in your belly (abdomen). You have trouble breathing. You are breathing very quickly. Your heart is beating very quickly. Your skin feels cold and clammy. You feel confused. You have signs of losing too much water in your body, such as: Dark pee, very little pee, or no pee. Cracked lips. Dry mouth. Sunken eyes. Sleepiness. Weakness. These symptoms may be an emergency. Get help right away. Call 911. Do not wait to see if the symptoms will go away. Do not drive yourself to the hospital. Summary Nausea is feeling that you have an upset stomach and that you are about to vomit. Vomiting is when food in your stomach comes out of your mouth. Follow instructions from your doctor about eating and drinking.  Take over-the-counter and prescription medicines only as told by your doctor. Contact your doctor if your symptoms get worse or you have new symptoms. Keep all follow-up visits. This information is not intended to replace advice given to you by your health care provider. Make sure you discuss any questions you have with your health care provider. Document Revised: 12/12/2020 Document Reviewed: 12/12/2020 Elsevier Patient Education  2024 [employer].

## 2023-11-29 NOTE — H&P (Signed)
 History and Physical  Alyssa Harper WUJ:[id number] DOB: 1961-03-15 DOA: 11/29/2023  PCP: Tisovec, Richard W, MD   Chief Complaint: Intractable vomiting  HPI: Alyssa Harper is a 63 y.o. female with medical history significant for Sjogren's disease, mitral valve prolapse, pancreatic cancer initially diagnosed in 2020 status post distal pancreatectomy/splenectomy treated with FOLFIRINOX which she tolerated without much difficulty who now unfortunately has recurrence of disease underwent gastrojejunostomy at [facility] 10/24/2023 and is now being admitted to the hospital with intractable vomiting after starting reduced dose NALIRIFOX at [facility] on 11/25/23.  She is in the process of establishing care with Dr. Scherrie Curt, who has agreed to provide supportive oncology services locally.  She denies any chest pain, diarrhea, fevers, she is having some subjective chills.  It has been about 72 hours since she was able to tolerate anything at all on her stomach, even water.  Over the weekend, she had a visit at [facility] ER and was able to go home after getting some fluids and felt better.  However she has continued to feel unwell and was not able to tolerate any p.o. intake at home.  Today she presented to the cancer center symptom management clinic, she did not improve significantly and was still unable to tolerate p.o. intake after fluids and multiple rounds of medications.  Therefore, hospitalist admission was requested.  Review of Systems: Please see HPI for pertinent positives and negatives. A complete 10 system review of systems are otherwise negative.  Past Medical History:  Diagnosis Date   Family history of adverse reaction to anesthesia    "grandmother stopped breathing"   Headache    "weekly" (07/03/2018)   High cholesterol    Mitral valve prolapse    Pancreatic mass 07/03/2018   Sjogren's disease [facility])    Past Surgical History:  Procedure Laterality Date   AUGMENTATION MAMMAPLASTY Bilateral 2013    ESOPHAGOGASTRODUODENOSCOPY N/A 07/05/2018   Procedure: ESOPHAGOGASTRODUODENOSCOPY (EGD);  Surgeon: Evangeline Hilts, MD;  Location: [facility];  Service: Endoscopy;  Laterality: N/A;   EUS N/A 07/05/2018   Procedure: UPPER ENDOSCOPIC ULTRASOUND (EUS) LINEAR and Radial;  Surgeon: Evangeline Hilts, MD;  Location: [facility] ENDOSCOPY;  Service: Endoscopy;  Laterality: N/A;   FINE NEEDLE ASPIRATION N/A 07/05/2018   Procedure: FINE NEEDLE ASPIRATION (FNA) LINEAR;  Surgeon: Evangeline Hilts, MD;  Location: [facility] ENDOSCOPY;  Service: Endoscopy;  Laterality: N/A;   Social History:  reports that she has never smoked. She has never used smokeless tobacco. She reports current alcohol use of about 7.0 standard drinks of alcohol per week. She reports that she does not use drugs.  Allergies  Allergen Reactions   Tape Dermatitis    IV3000 DRESSING FOR PORT ACCESS - Tegaderm cause irritation to the surrounding skin   Penicillins     No family history on file.   Prior to Admission medications   Medication Sig Start Date End Date Taking? Authorizing Provider  ALPRAZolam  (XANAX ) 0.25 MG tablet 0.25 mg as needed.  07/05/18   [provider]  CREON 24000-76000 units CPEP TAKE 2 CAPSULES JUST BEFORE MEALS AND 1 CAPSULE JUST BEFORE SNACKS 09/05/18   [provider]  dexamethasone (DECADRON) 4 MG tablet Take 4 mg by mouth as directed. Take 2 tablets daily for 2 days after chemo 09/07/18   [provider]  lidocaine-prilocaine (EMLA) cream Apply 1 application topically as needed. 09/06/18   [provider]  loratadine (CLARITIN) 10 MG tablet  11/16/18   [provider]  metoCLOPramide (REGLAN) 10 MG  tablet Take 1 tablet (10 mg total) by mouth every 6 (six) hours. 11/27/23   [name], MD  ondansetron  (ZOFRAN -ODT) 4 MG disintegrating tablet Take 1 tablet (4 mg total) by mouth every 8 (eight) hours as needed for nausea or vomiting. 10/13/23   Rexie Catena, PA-C  pantoprazole  (PROTONIX )  40 MG tablet Take 1 tablet (40 mg total) by mouth daily. 09/13/18   Almeda Jacobs, MD  promethazine  (PHENERGAN ) 25 MG tablet Take 1 tablet (25 mg total) by mouth every 6 (six) hours as needed for nausea. 07/05/18   Mikhail, Maryann, DO  UDENYCA 6 MG/0.6ML injection  10/19/18   [provider]    Physical Exam: There were no vitals taken for this visit. General:  Alert, oriented, calm, in no acute distress, she appears tired and dry on exam.  Her sister and husband are at the bedside. Cardiovascular: RRR, no murmurs or rubs, no peripheral edema.  Right anterior chest port is accessed, and benign in appearance Respiratory: clear to auscultation bilaterally, no wheezes, no crackles  Abdomen: soft, nontender, nondistended, normal bowel tones heard  Skin: dry, no rashes  Musculoskeletal: no joint effusions, normal range of motion  Psychiatric: appropriate affect, normal speech  Neurologic: extraocular muscles intact, clear speech, moving all extremities with intact sensorium         Labs on Admission:  Basic Metabolic Panel: Recent Labs  Lab 11/27/23 0527 11/29/23 1028  NA 136 138  K 4.1 3.5  CL 100 102  CO2 25 30  GLUCOSE 121* 102*  BUN 20 17  CREATININE 0.78 0.62  CALCIUM 8.9 8.1*  MG 2.2 2.0   Liver Function Tests: Recent Labs  Lab 11/27/23 0527 11/29/23 1028  AST 24 13*  ALT 23 16  ALKPHOS 66 58  BILITOT 0.7 0.6  PROT 6.6 5.9*  ALBUMIN 3.6 3.4*   Recent Labs  Lab 11/27/23 0527  LIPASE 30   No results for input(s): "AMMONIA" in the last 168 hours. CBC: Recent Labs  Lab 11/27/23 0527 11/29/23 1028  WBC 18.3* 13.3*  NEUTROABS  --  10.1*  HGB 12.5 11.9*  HCT 37.7 33.9*  MCV 100.0 93.9  PLT 377 304   Cardiac Enzymes: No results for input(s): "CKTOTAL", "CKMB", "CKMBINDEX", "TROPONINI" in the last 168 hours. BNP (last 3 results) No results for input(s): "BNP" in the last 8760 hours.  ProBNP (last 3 results) No results for input(s): "PROBNP" in the  last 8760 hours.  CBG: No results for input(s): "GLUCAP" in the last 168 hours.  Radiological Exams on Admission: No results found.  Assessment/Plan Alyssa Harper is a 63 y.o. female with medical history significant for Sjogren's disease, mitral valve prolapse, pancreatic cancer initially diagnosed in 2020 status post distal pancreatectomy/splenectomy treated with FOLFIRINOX which she tolerated without much difficulty who now unfortunately has recurrence of disease underwent gastrojejunostomy at [facility] 10/24/2023 and is now being admitted to the hospital with intractable vomiting after starting reduced dose NALIRIFOX at [facility] on 11/25/23.   Intractable nausea and vomiting-most likely due to chemotherapy despite being infused with significantly reduced doses.  She does have a history of difficulty with chemotherapy in the past. -Observation admission -Supportive care with IV fluids -Pain and nausea medication as needed -Regular diet ordered, patient to titrate per preference  Anemia-mild, no history or evidence of bleeding.  Probably related to her known malignancy, will monitor  GERD-IV Protonix   DVT prophylaxis: Lovenox      Code Status: Full Code  Consults called: None, oncology  Dr. Scherrie Curt added to inpatient treatment team  Admission status: Observation  Time spent: 70 minutes  Alyssa Scipio Rickey Charm MD [facility] Pager [phone]  If 7PM-7AM, please contact night-coverage [email or web address] Password [email or web address]  11/29/2023, 3:15 PM

## 2023-11-29 NOTE — Progress Notes (Signed)
 Pt direct admitted to [facility] 1610. This RN called report. Report given to [facility]. Pt transported to admissions at [facility] via w/c with family present.

## 2023-11-30 DIAGNOSIS — Z888 Allergy status to other drugs, medicaments and biological substances status: Secondary | ICD-10-CM | POA: Diagnosis not present

## 2023-11-30 DIAGNOSIS — Z880 Allergy status to penicillin: Secondary | ICD-10-CM | POA: Diagnosis not present

## 2023-11-30 DIAGNOSIS — Z515 Encounter for palliative care: Secondary | ICD-10-CM | POA: Diagnosis not present

## 2023-11-30 DIAGNOSIS — Z91048 Other nonmedicinal substance allergy status: Secondary | ICD-10-CM | POA: Diagnosis not present

## 2023-11-30 DIAGNOSIS — R112 Nausea with vomiting, unspecified: Secondary | ICD-10-CM

## 2023-11-30 DIAGNOSIS — M3500 Sicca syndrome, unspecified: Secondary | ICD-10-CM | POA: Diagnosis present

## 2023-11-30 DIAGNOSIS — E7800 Pure hypercholesterolemia, unspecified: Secondary | ICD-10-CM | POA: Diagnosis present

## 2023-11-30 DIAGNOSIS — T451X5A Adverse effect of antineoplastic and immunosuppressive drugs, initial encounter: Secondary | ICD-10-CM | POA: Diagnosis present

## 2023-11-30 DIAGNOSIS — K219 Gastro-esophageal reflux disease without esophagitis: Secondary | ICD-10-CM | POA: Diagnosis present

## 2023-11-30 DIAGNOSIS — R1110 Vomiting, unspecified: Secondary | ICD-10-CM | POA: Diagnosis present

## 2023-11-30 DIAGNOSIS — C253 Malignant neoplasm of pancreatic duct: Secondary | ICD-10-CM | POA: Diagnosis present

## 2023-11-30 DIAGNOSIS — Z90411 Acquired partial absence of pancreas: Secondary | ICD-10-CM | POA: Diagnosis not present

## 2023-11-30 DIAGNOSIS — Z9081 Acquired absence of spleen: Secondary | ICD-10-CM | POA: Diagnosis not present

## 2023-11-30 DIAGNOSIS — Z808 Family history of malignant neoplasm of other organs or systems: Secondary | ICD-10-CM | POA: Diagnosis not present

## 2023-11-30 DIAGNOSIS — I10 Essential (primary) hypertension: Secondary | ICD-10-CM | POA: Diagnosis present

## 2023-11-30 DIAGNOSIS — Z800 Family history of malignant neoplasm of digestive organs: Secondary | ICD-10-CM | POA: Diagnosis not present

## 2023-11-30 DIAGNOSIS — D630 Anemia in neoplastic disease: Secondary | ICD-10-CM | POA: Diagnosis present

## 2023-11-30 DIAGNOSIS — K5900 Constipation, unspecified: Secondary | ICD-10-CM | POA: Diagnosis present

## 2023-11-30 LAB — CBC
HCT: 35.6 % — ABNORMAL LOW (ref 36.0–46.0)
Hemoglobin: 11.9 g/dL — ABNORMAL LOW (ref 12.0–15.0)
MCH: 33 pg (ref 26.0–34.0)
MCHC: 33.4 g/dL (ref 30.0–36.0)
MCV: 98.6 fL (ref 80.0–100.0)
Platelets: 313 10*3/uL (ref 150–400)
RBC: 3.61 MIL/uL — ABNORMAL LOW (ref 3.87–5.11)
RDW: 12 % (ref 11.5–15.5)
WBC: 11.4 10*3/uL — ABNORMAL HIGH (ref 4.0–10.5)
nRBC: 0 % (ref 0.0–0.2)

## 2023-11-30 LAB — BASIC METABOLIC PANEL WITH GFR
Anion gap: 8 (ref 5–15)
BUN: 13 mg/dL (ref 8–23)
CO2: 28 mmol/L (ref 22–32)
Calcium: 8.6 mg/dL — ABNORMAL LOW (ref 8.9–10.3)
Chloride: 98 mmol/L (ref 98–111)
Creatinine, Ser: 0.58 mg/dL (ref 0.44–1.00)
GFR, Estimated: >60 mL/min (ref >60)
Glucose, Bld: 140 mg/dL — ABNORMAL HIGH (ref 70–99)
Potassium: 3.8 mmol/L (ref 3.5–5.1)
Sodium: 134 mmol/L — ABNORMAL LOW (ref 135–145)

## 2023-11-30 LAB — MAGNESIUM: Magnesium: 2 mg/dL (ref 1.7–2.4)

## 2023-11-30 MED ORDER — ALTEPLASE 2 MG IJ SOLR
2.0000 mg | Freq: Once | INTRAMUSCULAR | Status: AC
  Administered 2023-11-30: 2 mg
  Filled 2023-11-30: qty 2

## 2023-11-30 MED ORDER — DEXAMETHASONE SODIUM PHOSPHATE 4 MG/ML IJ SOLN
8.0000 mg | Freq: Once | INTRAMUSCULAR | Status: AC
  Administered 2023-11-30: 8 mg via INTRAVENOUS
  Filled 2023-11-30: qty 2

## 2023-11-30 MED ORDER — DOCUSATE SODIUM 100 MG PO CAPS
100.0000 mg | ORAL_CAPSULE | Freq: Two times a day (BID) | ORAL | Status: DC
  Administered 2023-11-30 – 2023-12-01 (×2): 100 mg via ORAL
  Filled 2023-11-30 (×2): qty 1

## 2023-11-30 MED ORDER — SODIUM CHLORIDE 0.9 % IV SOLN: INTRAVENOUS | Status: DC

## 2023-11-30 MED ORDER — LORAZEPAM 0.5 MG PO TABS
0.5000 mg | ORAL_TABLET | Freq: Four times a day (QID) | ORAL | Status: DC | PRN
  Administered 2023-11-30 (×2): 0.5 mg via SUBLINGUAL
  Filled 2023-11-30 (×2): qty 1

## 2023-11-30 MED ORDER — PROCHLORPERAZINE EDISYLATE 10 MG/2ML IJ SOLN
5.0000 mg | Freq: Once | INTRAMUSCULAR | Status: AC
  Administered 2023-11-30: 5 mg via INTRAVENOUS
  Filled 2023-11-30: qty 2

## 2023-11-30 MED ORDER — SUCRALFATE 1 GM/10ML PO SUSP
1.0000 g | Freq: Three times a day (TID) | ORAL | Status: DC
  Administered 2023-11-30 (×2): 1 g via ORAL
  Filled 2023-11-30 (×2): qty 10

## 2023-11-30 MED ORDER — CALCIUM CARBONATE ANTACID 500 MG PO CHEW
1.0000 | CHEWABLE_TABLET | Freq: Four times a day (QID) | ORAL | Status: DC
  Administered 2023-11-30 (×3): 200 mg via ORAL
  Filled 2023-11-30 (×4): qty 1

## 2023-11-30 MED ORDER — POLYETHYLENE GLYCOL 3350 17 G PO PACK
17.0000 g | PACK | Freq: Every day | ORAL | Status: DC
  Administered 2023-12-01: 17 g via ORAL
  Filled 2023-11-30: qty 1

## 2023-11-30 NOTE — Progress Notes (Signed)
 PROGRESS NOTE  Alyssa Harper ZOX:[id number] DOB: Nov 10, 1960 DOA: 11/29/2023 PCP: Tisovec, Richard W, MD   LOS: 0 days   Brief narrative:   Alyssa Harper is a 63 y.o. female with past medical history significant for Sjogren's disease, mitral valve prolapse, pancreatic cancer initially diagnosed in 2020 status post distal pancreatectomy/splenectomy treated with FOLFIRINOX presented to hospital with recurrence of disease underwent gastrojejunostomy at [facility] 10/24/2023 and is now being admitted to the hospital with intractable vomiting after starting reduced dose NALIRIFOX at [facility] on 11/25/23.  She is in the process of establishing care with Dr. Scherrie Curt, who has agreed to provide supportive oncology services locally.   Over the weekend, she had a visit at [facility] ER and was able to go home after getting some fluids and felt better.  However she has continued to feel unwell and was not able to tolerate any p.o. intake at home.  Subsequently patient presented to the cancer center symptom management clinic, and was admitted to the hospital for further evaluation and treatment.   Assessment/Plan: Principal Problem:   Chemotherapy induced nausea and vomiting  Intractable nausea and vomiting-  most likely due to chemotherapy despite being infused with significantly reduced doses.  She does have a history of difficulty with chemotherapy in the past.  Continue supportive care antiemetics. Advance diet as tolerated.  Symptomatic with nausea heartburn and doesnt feel at her baseline.  Has not had a bowel movement since initial diarrhea.  Continue IV hydration, IV Protonix  and symptomatic care.  pancreatic cancer being treated at [facility].  Status post chemotherapy.  Plan to establish care with Dr Sharalyn Dasen as outpatient.   Anemia-mild, secondary to malignancy.  GERD-IV Protonix  has been added to the regimen.  Add sucralfate.  Constipation.  Had initially diarrhea.  Will give Colace and MiraLAX.  DVT  prophylaxis: enoxaparin  (LOVENOX ) injection 40 mg Start: 11/29/23 2200   Disposition: Home likely on 12/01/2023 continues to tolerate oral intake with symptomatic improvement.  Status is: Observation  The patient will require care spanning > 2 midnights and should be moved to inpatient because: Pending clinical improvement,    Code Status:     Code Status: Full Code  Family Communication: Spoke with the patient's spouse at bedside  Consultants: Palliative care Medical oncology  Procedures: None  Anti-infectives:  None  Anti-infectives (From admission, onward)    None        Subjective: Today, patient was seen and examined at bedside.  Patient still has some heartburn and nausea episode of vomiting on still does not feel baseline.  Pain is controlled.  Has not had a bowel movement or diarrhea.  Objective: Vitals:   11/29/23 1958 11/30/23 0451  BP: (!) 98/52 125/67  Pulse: 71 60  Resp: 14 14  Temp: 98.4 F (36.9 C) 98.5 F (36.9 C)  SpO2: 97% 98%    Intake/Output Summary (Last 24 hours) at 11/30/2023 1114 Last data filed at 11/30/2023 0344 Gross per 24 hour  Intake 618.16 ml  Output 1400 ml  Net -781.84 ml   There were no vitals filed for this visit. There is no height or weight on file to calculate BMI.   Physical Exam:  GENERAL: Patient is alert awake and oriented. Not in obvious distress. HENT: No scleral pallor or icterus. Pupils equally reactive to light. Oral mucosa is moist, anterior chest wall port in place. NECK: is supple, no gross swelling noted. CHEST: Clear to auscultation. No crackles or wheezes.   CVS: S1 and S2  heard, no murmur. Regular rate and rhythm.  ABDOMEN: Soft, mild epigastric tenderness bowel sounds are present. EXTREMITIES: No edema. CNS: Cranial nerves are intact. No focal motor deficits. SKIN: warm and dry without rashes.  Data Review: I have personally reviewed the following laboratory data and studies,  CBC: Recent  Labs  Lab December 27, 2023 0527 11/29/23 1028  WBC 18.3* 13.3*  NEUTROABS  --  10.1*  HGB 12.5 11.9*  HCT 37.7 33.9*  MCV 100.0 93.9  PLT 377 304   Basic Metabolic Panel: Recent Labs  Lab 12/27/2023 0527 11/29/23 1028  NA 136 138  K 4.1 3.5  CL 100 102  CO2 25 30  GLUCOSE 121* 102*  BUN 20 17  CREATININE 0.78 0.62  CALCIUM 8.9 8.1*  MG 2.2 2.0   Liver Function Tests: Recent Labs  Lab 12/27/23 0527 11/29/23 1028  AST 24 13*  ALT 23 16  ALKPHOS 66 58  BILITOT 0.7 0.6  PROT 6.6 5.9*  ALBUMIN 3.6 3.4*   Recent Labs  Lab 27-Dec-2023 0527  LIPASE 30   No results for input(s): AMMONIA in the last 168 hours. Cardiac Enzymes: No results for input(s): CKTOTAL, CKMB, CKMBINDEX, TROPONINI in the last 168 hours. BNP (last 3 results) No results for input(s): BNP in the last 8760 hours.  ProBNP (last 3 results) No results for input(s): PROBNP in the last 8760 hours.  CBG: No results for input(s): GLUCAP in the last 168 hours. No results found for this or any previous visit (from the past 240 hours).   Studies: No results found.    Mardell Cragg, MD  [facility] 11/30/2023  If 7PM-7AM, please contact night-coverage

## 2023-11-30 NOTE — Hospital Course (Signed)
 Alyssa Harper is a 63 y.o. female with past medical history significant for Sjogren's disease, mitral valve prolapse, pancreatic cancer initially diagnosed in 2020 status post distal pancreatectomy/splenectomy treated with FOLFIRINOX presented to hospital with recurrence of disease underwent gastrojejunostomy at [facility] 10/24/2023 and is now being admitted to the hospital with intractable vomiting after starting reduced dose NALIRIFOX at [facility] on 11/25/23.  She is in the process of establishing care with Dr. Scherrie Curt, who has agreed to provide supportive oncology services locally.   Over the weekend, she had a visit at [facility] ER and was able to go home after getting some fluids and felt better.  However she has continued to feel unwell and was not able to tolerate any p.o. intake at home.  Subsequent LHC presented to the cancer center symptom management clinic, and was admitted to the hospital for further evaluation and treatment.  Assessment/Plan ] Intractable nausea and vomiting-most likely due to chemotherapy despite being infused with significantly reduced doses.  She does have a history of difficulty with chemotherapy in the past.  Continue supportive care antiemetics. Advance diet as tolerated.   Anemia-mild, secondary to malignancy.  GERD-IV Protonix 

## 2023-11-30 NOTE — Consult Note (Addendum)
  Cancer Center CONSULT NOTE  Patient Care Team: Tisovec, Kristina Pfeiffer, MD as PCP - General (Internal Medicine) [facility], [facility])  CHIEF COMPLAINTS/PURPOSE OF CONSULTATION:  History of Pancreatic Cancer  REFERRING PHYSICIAN: [facility]  HISTORY OF PRESENTING ILLNESS:  Alyssa Harper 63 y.o. female who was admitted on 11/29/2023 with intractable vomiting.  Patient has history of recurrent pancreatic cancer for which she follows at [facility] although receives supportive care with [facility].  She last received cycle 1 NALIRIFOX at [facility] on 11/25/2023.  Patient has been in the process of establishing oncologic outpatient medical care with Dr. Scherrie Curt; therefore oncology consultation is being done at this time. Patient is seen awake alert and oriented x 3 sitting up in chair at bedside.  Very pleasant and informative.  Spouse is also at bedside.  Reports that she feels iffy, had episodes of ongoing nausea/vomiting last night.  She is a good historian and details her medical history as described.  Medical history significant for pancreatic cancer initially diagnosed in 2020.  Had a distal pancreatectomy/splenectomy followed by adjuvant FOLFIRINOX.  Medical history also significant for Sjogren's, and hypertension. Surgical history significant for pancreatectomy and splenectomy in 2020, and breast surgery. Family history significant for maternal grandfather with pancreatic cancer, maternal grandmother with endometrial cancer, , father with melanoma. Social history is noncontributory-denies tobacco use, denies alcohol use, denies recreational or illicit drug use.  Worked as a judge.  [place]   I have reviewed her chart and materials related to her cancer extensively and collaborated history with the patient. Summary of oncologic history is as follows: Oncology History  Pancreatic adenocarcinoma (HCC)  07/03/2018 Imaging    MRCP 1. Abrupt caliber change of the pancreatic duct in the mid pancreas associated with an subtle ovoid lesion. Proximal to this mid pancreatic lesion the duct is significantly dilated and distally the duct is normal. Concern for pancreatic neoplasm. Recommend endoscopic ultrasound with tissue sampling.   2. Enhancing lesion in the subcapsular LEFT lateral hepatic lobe is indeterminate but favored small focal nodular hyperplasia or other benign lesion. Metastatic disease is not favored. Additional benign hepatic cysts within the liver.   3.   no evidence of metastatic adenopathy   07/03/2018 Imaging   Ct angiogram of chest and abdominal vessels: Chest CTA impression:   1. No acute cardiopulmonary disease. Specifically, no evidence of thoracic aortic aneurysm or dissection. No evidence of central pulmonary embolism.   Abdomen pelvis CTA impression:   1. No acute findings within the abdomen or pelvis. Specifically, no evidence of abdominal aortic aneurysm, dissection or mesenteric ischemia. 2. Pancreatic ductal dilatation with questioned approximately 6.0 cm hypoattenuating mass involving the proximal body of pancreas, incompletely evaluated on this arterial phase examination. Further evaluation with MRCP is advised. 3. Indeterminate approximately 1.2 cm hyperenhancing lesion within the subcapsular aspect of the left lobe of the liver, potentially representative of a perfusion anomaly or flash filling hemangioma, though incompletely characterized on the present examination. This lesion may also be further evaluated at the time of the recommended MRCP.   07/03/2018 Tumor Marker   Patient's tumor was tested for the following markers: CA-199 Results of the tumor marker test revealed 19   07/03/2018 - 07/05/2018 Hospital Admission   She was admitted to the hospital due to acute lower back pain and was diagnosed with acute pancreatitis.  Further evaluation detected abnormal lesion in the  pancreas.  Further EUS and  biopsy confirmed adenocarcinoma of the head of the pancreas   07/05/2018 Pathology Results   FINE NEEDLE ASPIRATION, ENDOSCOPIC, PANCREAS BODY(SPECIMEN 1 OF 1 COLLECTED 07/05/18): MALIGNANT CELLS PRESENT, CONSISTENT WITH ADENOCARCINOMA.  THERE IS LIKELY INSUFFICIENT MATERIAL FOR ADDITIONAL STUDIES   07/05/2018 Procedure   EUS report No lymphadenopathy seen. There was no sign of significant endosonographic abnormality in the left lobe of the liver. No focal pathology was identified. There was no sign of significant endosonographic abnormality in the common bile duct. An unremarkable gallbladder was identified. A round mass was identified in the pancreatic body. The mass was isoechoic. The mass measured 15 mm by 19 mm in maximal cross-sectional diameter. The outer margins were smooth. An intact interface was seen between the mass and the superior mesenteric artery and celiac artery, suggesting a lack of invasion. No obvious venous vascular involvement. The remainder of the pancreas was examined. The endosonographic appearance of parenchyma and the upstream pancreatic duct indicated duct dilation.  Fine needle aspiration for cytology was performed. Color Doppler imaging was utilized prior to needle puncture to confirm a lack of significant vascular structures within the needle path. Four passes were made with the 25 gauge needle using a transgastric approach. A stylet was used. A cytotechnologist was present to evaluate the adequacy of the specimen. The cellularity of the specimen was adequate. Final cytology results are pending. Estimated blood loss: none.  Summary A round mass was identified in the pancreatic body. The mass was isoechoic. The mass measured 15 mm by 19 mm in maximal cross-sectional diameter. The outer margins were smooth. An intact interface was seen between the mass and the superior mesenteric artery and celiac artery, suggesting a lack of invasion. No  obvious venous vascular involvement. The remainder of the pancreas was examined. The endosonographic appearance of parenchyma and the upstream pancreatic duct indicated duct dilation.  Fine needle aspiration for cytology was performed. Color Doppler imaging was utilized prior to needle puncture to confirm a lack of significant vascular structures within the needle path. Four passes were made with the 25 gauge needle using a transgastric approach. A stylet was used. A cytotechnologist was present to evaluate the adequacy of the specimen. The cellularity of the specimen was adequate. Final cytology results are pending. Estimated blood loss: none.  Impression - There was no evidence of significant pathology in the left lobe of the liver. - There was no sign of significant pathology in the common bile duct. - A mass was identified in the pancreatic body. Fine needle aspiration performed.   07/07/2018 Cancer Staging   Staging form: Exocrine Pancreas, AJCC 8th Edition - Clinical stage from 07/07/2018: Stage IA (cT1c, cN0, cM0) - Signed by Almeda Jacobs, MD on 07/07/2018   07/14/2018 Imaging   Ct scan of abdomen and pelvis Baseline Ct done in [place]   07/22/2018 Surgery   She had surgery in [facility] by Dr. William Jarnagin  Procedure: Laparoscopic distal pancreatectomy, laparoscopic splenectomy, explorotomy celiotomy, biopsies and LN removal   07/22/2018 Pathology Results   Adenocarcinoma of pancreas with 80% mucinous component, moderately differentiated, located on the body of pancreas, 1.8 cm greatest dimension, negative margin. No lymphovascular invasion; perineural invasion seen. Closest margin 0.7 cm proximal margin None of 16 LN were involved   07/28/2018 Imaging   Ct scan of abdomen and pelvis in [place] Fluid collection noted near the resection area.   07/31/2018 Procedure   She underwent EUS which revealed a cystic lesion in the pancreatic tail. 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  Patient expresses that regimen may be too much for her and would request further dose reduction or change in regimen. - Medical oncology/Dr. Scherrie Curt following closely make further evaluation and treatment recommendations.  Nausea/vomiting - Secondary to recent chemotherapy received 6/6, potential contribution from the pancreas mass and recent surgery - Continue antiemetic regimen as ordered, add pantoprazole , lorazepam , and Decadron - Continue IV fluids and supportive care  Anemia - Mild, multifactorial secondary to malignancy, chemotherapy - Hemoglobin 11.9.  No transfusional intervention required at this time - Continue to monitor CBC with differential.  Hypertension Sjogren's disease - No antihypertensives noted - Continue to monitor blood pressure closely - Continue outpatient rheum follow-up   MEDICAL HISTORY:  Past Medical History:  Diagnosis Date   Family history of adverse reaction to anesthesia    grandmother stopped breathing   Headache    weekly (07/03/2018)   High cholesterol    Mitral valve prolapse    Pancreatic mass 07/03/2018   Sjogren's disease (HCC)     SURGICAL HISTORY: Past Surgical History:  Procedure Laterality Date   AUGMENTATION MAMMAPLASTY Bilateral 2013   ESOPHAGOGASTRODUODENOSCOPY N/A 07/05/2018    Procedure: ESOPHAGOGASTRODUODENOSCOPY (EGD);  Surgeon: Evangeline Hilts, MD;  Location: [facility];  Service: Endoscopy;  Laterality: N/A;   EUS N/A 07/05/2018   Procedure: UPPER ENDOSCOPIC ULTRASOUND (EUS) LINEAR and Radial;  Surgeon: Evangeline Hilts, MD;  Location: [facility];  Service: Endoscopy;  Laterality: N/A;   FINE NEEDLE ASPIRATION N/A 07/05/2018   Procedure: FINE NEEDLE ASPIRATION (FNA) LINEAR;  Surgeon: Evangeline Hilts, MD;  Location: [facility];  Service: Endoscopy;  Laterality: N/A;    SOCIAL HISTORY: Social History   Socioeconomic History   Marital status: Married    Spouse name: Not on file   Number of children: Not on file   Years of education: Not on file   Highest education level: Not on file  Occupational History   Not on file  Tobacco Use   Smoking status: Never   Smokeless tobacco: Never  Vaping Use   Vaping status: Never Used  Substance and Sexual Activity   Alcohol use: Yes    Alcohol/week: 7.0 standard drinks of alcohol    Types: 7 Standard drinks or equivalent per week    Comment: glass of wine per night   Drug use: Never   Sexual activity: Yes  Other Topics Concern   Not on file  Social History Narrative   ** Merged History Encounter **       Social Drivers of Health   Financial Resource Strain: Not At Risk (11/08/2023)   Received from [facility]   Financial Resource Strain    I worry about the financial problems I will have in the future as a result of my illness or treatment.: Somewhat    I am satisfied with my current financial situation.:  Somewhat  Food Insecurity: No Food Insecurity (11/29/2023)   Hunger Vital Sign    Worried About Running Out of Food in the Last Year: Never true    Ran Out of Food in the Last Year: Never true  Transportation Needs: No Transportation Needs (11/29/2023)   PRAPARE - Administrator, Civil Service (Medical): No    Lack of Transportation (Non-Medical): No  Physical  Activity: Not on file  Stress: Not on file  Social Connections: Unknown (04/15/2023)   Received from [facility]   Social Network    Social Network: Not on file  Intimate Partner Violence: Not At Risk (11/29/2023)   Humiliation, Afraid, Rape, and Kick questionnaire    Fear of Current or Ex-Partner: No    Emotionally Abused: No    Physically Abused: No    Sexually Abused: No    FAMILY HISTORY: History reviewed. No pertinent family history.   PHYSICAL EXAMINATION: ECOG PERFORMANCE STATUS: 1 - Symptomatic but completely ambulatory  Vitals:   11/29/23 1958 11/30/23 0451  BP: (!) 98/52 125/67  Pulse: 71 60  Resp: 14 14  Temp: 98.4 F (36.9 C) 98.5 F (36.9 C)  SpO2: 97% 98%   There were no vitals filed for this visit.  GENERAL: alert, no distress and comfortable SKIN: skin color, texture, turgor are normal, no rashes or significant lesions EYES: normal, conjunctiva are pink and non-injected, sclera clear OROPHARYNX: no exudate, no erythema and lips, buccal mucosa, and tongue normal  NECK: supple, thyroid  normal size, non-tender, without nodularity LYMPH: no palpable lymphadenopathy in the cervical, axillary or inguinal LUNGS: clear to auscultation and percussion with normal breathing effort HEART: regular rate & rhythm and no murmurs and no lower extremity edema ABDOMEN: abdomen soft, non-tender and normal bowel sounds, healed surgical incision MUSCULOSKELETAL: no cyanosis of digits and no clubbing  PSYCH: alert & oriented x 3 with fluent speech NEURO: no focal motor/sensory deficits   ALLERGIES:  is allergic to tape, wound dressing adhesive, pollen extract, proton pump inhibitors, and penicillins.  MEDICATIONS:  Current Facility-Administered Medications  Medication Dose Route Frequency Provider Last Rate Last Admin   0.9 %  sodium chloride  infusion   Intravenous Continuous Pokhrel, Laxman, MD       acetaminophen  (TYLENOL ) tablet 650 mg  650 mg Oral Q6H PRN  Jannette Mend, Mir M, MD       Or   acetaminophen  (TYLENOL ) suppository 650 mg  650 mg Rectal Q6H PRN Jannette Mend, Mir M, MD       albuterol (PROVENTIL) (2.5 MG/3ML) 0.083% nebulizer solution 2.5 mg  2.5 mg Nebulization Q2H PRN Jannette Mend, Mir M, MD       calcium carbonate (TUMS - dosed in mg elemental calcium) chewable tablet 200 mg of elemental calcium  1 tablet Oral TID Jannette Mend, Mir M, MD   200 mg of elemental calcium at 11/29/23 1823   dexamethasone (DECADRON) injection 8 mg  8 mg Intravenous Once Reagan Klemz B, MD       enoxaparin  (LOVENOX ) injection 40 mg  40 mg Subcutaneous Q24H Jannette Mend, Mir M, MD       HYDROmorphone  (DILAUDID ) injection 0.5-1 mg  0.5-1 mg Intravenous Q2H PRN Jannette Mend, Mir M, MD       LORazepam  (ATIVAN ) tablet 0.5 mg  0.5 mg Sublingual Q6H PRN Sumner Ends, MD       ondansetron  (ZOFRAN ) tablet 4 mg  4 mg Oral Q6H PRN Gaylin Ke, MD       Or  ondansetron  (ZOFRAN ) injection 4 mg  4 mg Intravenous Q6H PRN Jannette Mend, Mir M, MD   4 mg at 11/30/23 7829   oxyCODONE  (Oxy IR/ROXICODONE ) immediate release tablet 5 mg  5 mg Oral Q4H PRN Jannette Mend, Mir M, MD       pantoprazole  (PROTONIX ) injection 40 mg  40 mg Intravenous Q24H Jannette Mend, Mir M, MD       promethazine  (PHENERGAN ) 12.5 mg in sodium chloride  0.9 % 50 mL IVPB  12.5 mg Intravenous Q6H PRN Jannette Mend, Mir M, MD 150 mL/hr at 11/30/23 0400 12.5 mg at 11/30/23 0400   traZODone (DESYREL) tablet 25 mg  25 mg Oral QHS PRN Jannette Mend, Mir M, MD         LABORATORY DATA:  I have reviewed the data as listed Lab Results  Component Value Date   WBC 13.3 (H) 11/29/2023   HGB 11.9 (L) 11/29/2023   HCT 33.9 (L) 11/29/2023   MCV 93.9 11/29/2023   PLT 304 11/29/2023   Recent Labs    10/13/23 1721 11/27/23 0527 11/29/23 1028  NA 140 136 138  K 4.0 4.1 3.5  CL 96* 100 102  CO2 32 25 30  GLUCOSE 135* 121* 102*  BUN 12 20 17   CREATININE 0.89 0.78 0.62  CALCIUM 10.4* 8.9 8.1*  GFRNONAA >60 >60 >60   PROT 7.7 6.6 5.9*  ALBUMIN 5.0 3.6 3.4*  AST 24 24 13*  ALT 15 23 16   ALKPHOS 93 66 58  BILITOT 0.5 0.7 0.6  BILIDIR  --  <0.1  --   IBILI  --  NOT CALCULATED  --     RADIOGRAPHIC STUDIES: I have personally reviewed the radiological images as listed and agreed with the findings in the report. DG Chest Port 1 View Result Date: 11/27/2023 CLINICAL DATA:  [id number] Chest pain [id number] EXAM: PORTABLE CHEST - 1 VIEW COMPARISON:  09/15/2018. FINDINGS: Cardiac silhouette is unremarkable. No pneumothorax or pleural effusion. The lungs are clear. The visualized skeletal structures are unremarkable. Right-sided Port-A-Cath tip overlies distal SVC. IMPRESSION: No acute cardiopulmonary process. Electronically Signed   By: Sydell Eva M.D.   On: 11/27/2023 07:07     The total time spent in the appointment was 55 minutes encounter with patients including review of chart and various tests results, discussions about plan of care and coordination of care plan   All questions were answered. The patient knows to call the clinic with any problems, questions or concerns. No barriers to learning was detected.  Jacqualin Mate, NP 6/11/20259:46 AM Ms. Meno was interviewed and examined.  I reviewed notes from [facility] and [facility]  She was diagnosed with early stage pancreas cancer in 2020 and underwent a distal pancreatectomy/penectomy followed by adjuvant FOLFIRINOX.  She presented in April of this year with gastric outlet obstruction and was found to have recurrent tumor involving the pancreas with duodenal obstruction.  She began NALIRIFOX 11/25/2023 with dose reductions due to previous poor tolerance of chemotherapy.  The plan is to obtain restaging scans after 3 months of systemic therapy prior to considering resection of the recurrent mass.    She developed nausea/vomiting beginning 11/26/2023 and was seen in the emergency room over the weekend.  Her symptoms persisted and she was admitted  yesterday.  She reports emesis during the night and limited oral intake.  Nausea persist despite Decadron, ondansetron , and Phenergan .  I suspect the nausea/vomiting is largely related to chemotherapy, but the pancreas mass and recent gastrojejunostomy may be contributing.  I recommend continuing intravenous hydration.  We will adjust the antiemetic regimen. She is scheduled for outpatient follow-up at the Cancer center 12/06/2023.  I will communicate with Dr. Nino Bass to coordinate future chemotherapy plans.  Recommendations:  Continue intravenous hydration Decadron today Lorazepam  as needed Pantoprazole  Outpatient follow-up as scheduled at the cancer center 12/06/2023

## 2023-11-30 NOTE — Plan of Care (Signed)

## 2023-11-30 NOTE — Progress Notes (Signed)
   11/30/23 1409  TOC Brief Assessment  Insurance and Status Reviewed  Patient has primary care physician Yes (Tisovec, Kristina Pfeiffer, MD)  Home environment has been reviewed from home  Prior level of function: Independent  Prior/Current Home Services No current home services  Social Drivers of Health Review SDOH reviewed no interventions necessary  Readmission risk has been reviewed Yes  Transition of care needs no transition of care needs at this time

## 2023-11-30 NOTE — Progress Notes (Signed)
 Mobility Specialist - Progress Note   11/30/23 1409  Mobility  Activity Ambulated independently in hallway;Off unit  Level of Assistance Independent  Assistive Device None;Wheelchair  Distance Ambulated (ft) 500 ft  Activity Response Tolerated well  Mobility Referral Yes  Mobility visit 1 Mobility  Mobility Specialist Start Time (ACUTE ONLY) 1335  Mobility Specialist Stop Time (ACUTE ONLY) 1408  Mobility Specialist Time Calculation (min) (ACUTE ONLY) 33 min   Pt received in bed and agreeable to mobility. Pt then wheeled to 3E to view mural. No complaints during session. Pt to bed after session with all needs met. Sister in room.   [facility]

## 2023-11-30 NOTE — Plan of Care (Addendum)
 Pt refused middle of the night vitals as she wanted to rest. She also declined fluids at 2100 and requested to resume at apprx 0300. Pt medicated as needed for nausea per MD orders, no reports of emesis. Pt is voiding without difficulty. Daughter accompanies pt at bedside, call light within reach.    0981 pt's daughter reported pt was vomiting. All MD orders were utilized, house coverage notified for additional orders.   Problem: Education: Goal: Knowledge of General Education information will improve Description: Including pain rating scale, medication(s)/side effects and non-pharmacologic comfort measures Outcome: Progressing   Problem: Activity: Goal: Risk for activity intolerance will decrease Outcome: Progressing   Problem: Elimination: Goal: Will not experience complications related to bowel motility Outcome: Progressing   Problem: Pain Managment: Goal: General experience of comfort will improve and/or be controlled Outcome: Progressing   Problem: Safety: Goal: Ability to remain free from injury will improve Outcome: Progressing

## 2023-12-01 DIAGNOSIS — R112 Nausea with vomiting, unspecified: Secondary | ICD-10-CM | POA: Diagnosis not present

## 2023-12-01 MED ORDER — SODIUM CHLORIDE 0.9% FLUSH
10.0000 mL | Freq: Two times a day (BID) | INTRAVENOUS | Status: DC
  Administered 2023-12-01: 10 mL

## 2023-12-01 MED ORDER — PANTOPRAZOLE SODIUM 40 MG PO TBEC: 40.0000 mg | DELAYED_RELEASE_TABLET | Freq: Every day | ORAL | 1 refills | Status: AC

## 2023-12-01 MED ORDER — SODIUM CHLORIDE 0.9% FLUSH: 10.0000 mL | INTRAVENOUS | Status: DC | PRN

## 2023-12-01 MED ORDER — LORAZEPAM 0.5 MG PO TABS: 0.5000 mg | ORAL_TABLET | Freq: Four times a day (QID) | ORAL | 0 refills | Status: DC | PRN

## 2023-12-01 MED ORDER — HEPARIN SOD (PORK) LOCK FLUSH 100 UNIT/ML IV SOLN
500.0000 [IU] | Freq: Once | INTRAVENOUS | Status: AC
  Administered 2023-12-01: 500 [IU] via INTRAVENOUS
  Filled 2023-12-01: qty 5

## 2023-12-01 MED ORDER — SUCRALFATE 1 GM/10ML PO SUSP: 1.0000 g | Freq: Three times a day (TID) | ORAL | 0 refills | Status: DC

## 2023-12-01 MED ORDER — CHLORHEXIDINE GLUCONATE CLOTH 2 % EX PADS: 6.0000 | MEDICATED_PAD | Freq: Every day | CUTANEOUS | Status: DC

## 2023-12-01 MED ORDER — CALCIUM CARBONATE ANTACID 500 MG PO CHEW: 1.0000 | CHEWABLE_TABLET | Freq: Four times a day (QID) | ORAL | 0 refills | Status: AC

## 2023-12-01 NOTE — Discharge Summary (Signed)
 Physician Discharge Summary  Natalye Kott BJY:[id number] DOB: 1961-05-19 DOA: 11/29/2023  PCP: Tisovec, Richard W, MD  Admit date: 11/29/2023 Discharge date: 12/01/2023  Admitted From: Home  Discharge disposition: Home   Recommendations for Outpatient Follow-Up:   Follow up with your primary care provider in one week.  Check CBC, BMP, magnesium in the next visit Follow-up with oncology as has been scheduled on 12/06/2023   Discharge Diagnosis:   Principal Problem:   Chemotherapy induced nausea and vomiting Active Problems:   Intractable vomiting   Discharge Condition: Improved.  Diet recommendation: Regular.  Wound care: None.  Code status: Full.   History of Present Illness:   Alyssa Harper is a 63 y.o. female with past medical history significant for Sjogren's disease, mitral valve prolapse, pancreatic cancer initially diagnosed in 2020 status post distal pancreatectomy/splenectomy treated with FOLFIRINOX presented to hospital with recurrence of disease underwent gastrojejunostomy at [facility] 10/24/2023 and is now being admitted to the hospital with intractable vomiting after starting reduced dose NALIRIFOX at [facility] on 11/25/23.  She is in the process of establishing care with Dr. Scherrie Curt, who has agreed to provide supportive oncology services locally.   Over the weekend, she had a visit at [facility] ER and was able to go home after getting some fluids and felt better.  However she has continued to feel unwell and was not able to tolerate any p.o. intake at home.  Subsequently patient presented to the cancer center symptom management clinic, and was admitted to the hospital for further evaluation and treatment.    Hospital Course:   Following conditions were addressed during hospitalization as listed below,  Intractable nausea and vomiting-  most likely due to chemotherapy despite being infused with significantly reduced doses.  She does have a history of difficulty with  chemotherapy in the past.  During hospitalization patient received supportive care antiemetics, IV Protonix .  Subsequently was able to tolerate oral diet.  Oncology recommended sublingual Ativan  on discharge for nausea.   pancreatic cancer being treated at [facility].  Status post chemotherapy.  Plan to establish care with Dr Sharalyn Dasen as outpatient.  Plan for follow-up on 12/06/2023   Anemia-mild, secondary to malignancy.   GERD- continue Protonix  and sucralfate on discharge.   Constipation.  Had initially diarrhea.  Resolved at this time.  Disposition.  At this time, patient is stable for disposition home with outpatient PCP and oncology follow-up  Medical Consultants:   Medical oncology  Procedures:    none Subjective:   Today, patient was seen and examined at bedside.  Complains of feeling better today.  No nausea vomiting or abdominal pain.  Wishes to go home.  Discharge Exam:   Vitals:   11/30/23 2146 12/01/23 0425  BP: 122/70 124/62  Pulse: 61 70  Resp: 14 16  Temp: 98.4 F (36.9 C) 98 F (36.7 C)  SpO2: 97% 97%   Vitals:   11/30/23 1320 11/30/23 1550 11/30/23 2146 12/01/23 0425  BP: 136/73  122/70 124/62  Pulse: 66  61 70  Resp: 16  14 16   Temp: 98.6 F (37 C)  98.4 F (36.9 C) 98 F (36.7 C)  TempSrc: Oral  Oral Oral  SpO2: 95%  97% 97%  Weight:  51.7 kg    Height:  5' 5 (1.651 m)      General: Alert awake, not in obvious distress HENT: pupils equally reacting to light,  No scleral pallor or icterus noted. Oral mucosa is moist.  Chest:  Clear breath  sounds.  Diminished breath sounds bilaterally. No crackles or wheezes.  Chest wall port in place. CVS: S1 &S2 heard. No murmur.  Regular rate and rhythm. Abdomen: Soft, nontender, nondistended.  Bowel sounds are heard.   Extremities: No cyanosis, clubbing or edema.  Peripheral pulses are palpable. Psych: Alert, awake and oriented, normal mood CNS:  No cranial nerve deficits.  Power equal in all extremities.    Skin: Warm and dry.  No rashes noted.  The results of significant diagnostics from this hospitalization (including imaging, microbiology, ancillary and laboratory) are listed below for reference.     Diagnostic Studies:   No results found.   Labs:   Basic Metabolic Panel: Recent Labs  Lab 11/27/23 0527 11/29/23 1028 11/30/23 1848  NA 136 138 134*  K 4.1 3.5 3.8  CL 100 102 98  CO2 25 30 28   GLUCOSE 121* 102* 140*  BUN 20 17 13   CREATININE 0.78 0.62 0.58  CALCIUM 8.9 8.1* 8.6*  MG 2.2 2.0 2.0   GFR Estimated Creatinine Clearance: 58.7 mL/min (by C-G formula based on SCr of 0.58 mg/dL). Liver Function Tests: Recent Labs  Lab 11/27/23 0527 11/29/23 1028  AST 24 13*  ALT 23 16  ALKPHOS 66 58  BILITOT 0.7 0.6  PROT 6.6 5.9*  ALBUMIN 3.6 3.4*   Recent Labs  Lab 11/27/23 0527  LIPASE 30   No results for input(s): AMMONIA in the last 168 hours. Coagulation profile No results for input(s): INR, PROTIME in the last 168 hours.  CBC: Recent Labs  Lab 11/27/23 0527 11/29/23 1028 11/30/23 1848  WBC 18.3* 13.3* 11.4*  NEUTROABS  --  10.1*  --   HGB 12.5 11.9* 11.9*  HCT 37.7 33.9* 35.6*  MCV 100.0 93.9 98.6  PLT 377 304 313   Cardiac Enzymes: No results for input(s): CKTOTAL, CKMB, CKMBINDEX, TROPONINI in the last 168 hours. BNP: Invalid input(s): POCBNP CBG: No results for input(s): GLUCAP in the last 168 hours. D-Dimer No results for input(s): DDIMER in the last 72 hours. Hgb A1c No results for input(s): HGBA1C in the last 72 hours. Lipid Profile No results for input(s): CHOL, HDL, LDLCALC, TRIG, CHOLHDL, LDLDIRECT in the last 72 hours. Thyroid  function studies No results for input(s): TSH, T4TOTAL, T3FREE, THYROIDAB in the last 72 hours.  Invalid input(s): FREET3 Anemia work up No results for input(s): VITAMINB12, FOLATE, FERRITIN, TIBC, IRON, RETICCTPCT in the last 72  hours. Microbiology No results found for this or any previous visit (from the past 240 hours).   Discharge Instructions:   Discharge Instructions     Call MD for:  persistant nausea and vomiting   Complete by: As directed    Call MD for:  severe uncontrolled pain   Complete by: As directed    Diet general   Complete by: As directed    Discharge instructions   Complete by: As directed    Follow-up with your primary care provider in 1 week.  Check blood work at that time.  Follow-up with oncologist as scheduled by the clinic.  Seek medical attention for worsening symptoms.   Increase activity slowly   Complete by: As directed       Allergies as of 12/01/2023       Reactions   Tape Dermatitis, Other (See Comments)   IV3000 DRESSING FOR PORT ACCESS - Tegaderm cause irritation to the surrounding skin   Wound Dressing Adhesive Dermatitis, Other (See Comments)   IV3000 DRESSING FOR PORT ACCESS - Tegaderm  cause irritation to the surrounding skin   Pollen Extract Other (See Comments)   Hayfever   Proton Pump Inhibitors Other (See Comments)   Contraindicated while doing chemotherapy- PEPCID  is tolerate   Penicillins Rash        Medication List     TAKE these medications    Afrin Nasal Spray 0.05 % nasal spray Generic drug: oxymetazoline  Place 1 spray into both nostrils 2 (two) times daily as needed for congestion.   ALPRAZolam  0.25 MG tablet Commonly known as: XANAX  Take 0.25 mg by mouth daily as needed for anxiety.   calcium carbonate 500 MG chewable tablet Commonly known as: TUMS - dosed in mg elemental calcium Chew 1 tablet (200 mg of elemental calcium total) by mouth 4 (four) times daily for 5 days.   Creon 24000-76000 units Cpep Generic drug: Pancrelipase (Lip-Prot-Amyl) Take 24,000 Units by mouth daily as needed (to aid food digestion- as determined by the patient).   dexamethasone 4 MG tablet Commonly known as: DECADRON Take 8 mg by mouth See admin  instructions. Take 8 mg (2 tablets) by mouth once a day for 2 days after chemo   docusate sodium 100 MG capsule Commonly known as: COLACE Take 100 mg by mouth 2 (two) times daily as needed for mild constipation.   famotidine  40 MG tablet Commonly known as: PEPCID  Take 40 mg by mouth 2 (two) times daily before a meal.   lidocaine-prilocaine cream Commonly known as: EMLA Apply 1 application  topically as needed (for port access).   LORazepam  0.5 MG tablet Commonly known as: ATIVAN  Place 1 tablet (0.5 mg total) under the tongue every 6 (six) hours as needed (nausea).   metoCLOPramide 10 MG tablet Commonly known as: REGLAN Take 1 tablet (10 mg total) by mouth every 6 (six) hours. What changed:  when to take this reasons to take this   neomycin-polymyxin-dexameth 0.1 % Oint Commonly known as: MAXITROL Place 1 Application into the left eye 2 (two) times daily as needed (for irritation).   ondansetron  4 MG disintegrating tablet Commonly known as: ZOFRAN -ODT Take 1 tablet (4 mg total) by mouth every 8 (eight) hours as needed for nausea or vomiting. What changed: reasons to take this   ondansetron  8 MG tablet Commonly known as: ZOFRAN  Take 8 mg by mouth every 8 (eight) hours as needed for nausea or vomiting.   oxyCODONE  5 MG immediate release tablet Commonly known as: Oxy IR/ROXICODONE  Take 2.5-5 mg by mouth every 4 (four) hours as needed (for pain).   pantoprazole  40 MG tablet Commonly known as: PROTONIX  Take 1 tablet (40 mg total) by mouth daily.   promethazine  12.5 MG tablet Commonly known as: PHENERGAN  Take 12.5 mg by mouth every 6 (six) hours as needed for nausea or vomiting. What changed: Another medication with the same name was removed. Continue taking this medication, and follow the directions you see here.   sodium chloride  0.9 % infusion Inject 750-1,000 mLs into the vein See admin instructions. Inject 750-1,000 ml's into the vein TWICE WEEKLY after chemotherapy  that occurs every 2 weeks   sucralfate 1 GM/10ML suspension Commonly known as: CARAFATE Take 10 mLs (1 g total) by mouth 4 (four) times daily -  with meals and at bedtime.   TYLENOL  500 MG tablet Generic drug: acetaminophen  Take 250 mg by mouth every 6 (six) hours as needed for mild pain (pain score 1-3) or headache.        Follow-up Information     Tisovec,  Kristina Pfeiffer, MD Follow up.   Specialty: Internal Medicine Contact information: [address] [place] [address] [phone]                  Time coordinating discharge: 39 minutes  Signed:  Anzel Kearse  [facility] 12/01/2023, 5:50 PM

## 2023-12-01 NOTE — Plan of Care (Signed)

## 2023-12-01 NOTE — Plan of Care (Signed)

## 2023-12-01 NOTE — Progress Notes (Signed)
 IP PROGRESS NOTE  Subjective:   Alyssa Harper feels better.  No more vomiting.  She is tolerating oral intake.  She feels the Decadron and lorazepam  helped.  No new complaint.  Objective: Vital signs in last 24 hours: Blood pressure 124/62, pulse 70, temperature 98 F (36.7 C), temperature source Oral, resp. rate 16, height 5' 5 (1.651 m), weight 114 lb (51.7 kg), SpO2 97%.  Intake/Output from previous day: 06/11 0701 - 06/12 0700 In: 1689.7 [P.O.:840; I.V.:799.7; IV Piggyback:50] Out: -   Physical Exam:  HEENT: No thrush or ulcers Abdomen: Soft and nontender, no mass   Portacath/PICC-without erythema  Lab Results: Recent Labs    11/29/23 1028 11/30/23 1848  WBC 13.3* 11.4*  HGB 11.9* 11.9*  HCT 33.9* 35.6*  PLT 304 313    BMET Recent Labs    11/29/23 1028 11/30/23 1848  NA 138 134*  K 3.5 3.8  CL 102 98  CO2 30 28  GLUCOSE 102* 140*  BUN 17 13  CREATININE 0.62 0.58  CALCIUM 8.1* 8.6*    Lab Results  Component Value Date   [id number] 19 07/03/2018    Studies/Results: No results found.  Medications: I have reviewed the patient's current medications.  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   Admission 11/29/2023 with intractable nausea/vomiting-improved  Alyssa. Harper was admitted 11/29/2023 with intractable nausea/vomiting following chemotherapy.  Her symptoms have improved.  It appears the nausea was likely related to chemotherapy as opposed to the pancreas mass or recent surgery.  She is now tolerating a diet and appears stable for discharge to home.  I discussed the treatment plan with Alyssa. Harper and her sister.  She is considering neoadjuvant chemotherapy to be followed by surgery.  She is also considering  neoadjuvant chemotherapy/radiation.  She reports results of recent Foundation 1 testing at [facility] (I do not have this report available yet today) indicated a BRCA 1 alteration.  She reports negative germline BRCA testing in the past.  We discussed the potential increased response rate with platinum agents and BRCA mutated tumors.  She will for a follow-up visit as scheduled on 12/06/2023.  She will make a decision on having cycle 2 chemotherapy at [facility] versus here.  Recommendations: She appears stable for discharge to home from an oncology standpoint Continue lorazepam  and Protonix  Outpatient follow-up at the cancer center as scheduled 12/06/2023   LOS: 1 day   Coni Deep, MD   12/01/2023, 1:25 PM

## 2023-12-02 ENCOUNTER — Inpatient Hospital Stay
Admission: RE | Admit: 2023-12-02 | Discharge: 2023-12-02 | Disposition: A | Discharge status: Home / Self Care | Payer: Self-pay | Source: Ambulatory Visit | Attending: Oncology | Admitting: Oncology

## 2023-12-02 ENCOUNTER — Other Ambulatory Visit: Payer: Self-pay | Admitting: *Deleted

## 2023-12-02 DIAGNOSIS — C259 Malignant neoplasm of pancreas, unspecified: Secondary | ICD-10-CM

## 2023-12-06 ENCOUNTER — Other Ambulatory Visit: Payer: Self-pay | Admitting: *Deleted

## 2023-12-06 ENCOUNTER — Encounter: Payer: Self-pay | Admitting: *Deleted

## 2023-12-06 ENCOUNTER — Inpatient Hospital Stay (HOSPITAL_BASED_OUTPATIENT_CLINIC_OR_DEPARTMENT_OTHER): Admitting: Oncology

## 2023-12-06 ENCOUNTER — Inpatient Hospital Stay

## 2023-12-06 VITALS — BP 109/68 | HR 84 | Temp 98.6°F | Resp 18 | Ht 65.0 in | Wt 112.0 lb

## 2023-12-06 DIAGNOSIS — C259 Malignant neoplasm of pancreas, unspecified: Secondary | ICD-10-CM

## 2023-12-06 DIAGNOSIS — R112 Nausea with vomiting, unspecified: Secondary | ICD-10-CM | POA: Diagnosis not present

## 2023-12-06 DIAGNOSIS — Z452 Encounter for adjustment and management of vascular access device: Secondary | ICD-10-CM | POA: Diagnosis not present

## 2023-12-06 DIAGNOSIS — R197 Diarrhea, unspecified: Secondary | ICD-10-CM | POA: Diagnosis not present

## 2023-12-06 DIAGNOSIS — M810 Age-related osteoporosis without current pathological fracture: Secondary | ICD-10-CM | POA: Diagnosis not present

## 2023-12-06 DIAGNOSIS — Z5189 Encounter for other specified aftercare: Secondary | ICD-10-CM | POA: Diagnosis not present

## 2023-12-06 DIAGNOSIS — C251 Malignant neoplasm of body of pancreas: Secondary | ICD-10-CM | POA: Diagnosis present

## 2023-12-06 DIAGNOSIS — Z5111 Encounter for antineoplastic chemotherapy: Secondary | ICD-10-CM | POA: Diagnosis present

## 2023-12-06 DIAGNOSIS — Z515 Encounter for palliative care: Secondary | ICD-10-CM | POA: Diagnosis not present

## 2023-12-06 MED ORDER — SODIUM CHLORIDE 0.9 % IV SOLN: Freq: Once | INTRAVENOUS | Status: AC

## 2023-12-06 MED ORDER — HEPARIN SOD (PORK) LOCK FLUSH 100 UNIT/ML IV SOLN
500.0000 [IU] | Freq: Once | INTRAVENOUS | Status: AC
  Administered 2023-12-06: 500 [IU] via INTRAVENOUS

## 2023-12-06 MED ORDER — SODIUM CHLORIDE 0.9 % IV SOLN
6.2500 mg | Freq: Once | INTRAVENOUS | Status: DC
  Filled 2023-12-06: qty 0.25

## 2023-12-06 MED ORDER — SODIUM CHLORIDE 0.9 % IV SOLN: 6.2500 mg | Freq: Four times a day (QID) | INTRAVENOUS | Status: DC | PRN

## 2023-12-06 MED ORDER — SODIUM CHLORIDE 0.9 % IV SOLN: Freq: Once | INTRAVENOUS | Status: DC

## 2023-12-06 MED ORDER — DEXAMETHASONE SODIUM PHOSPHATE 10 MG/ML IJ SOLN
10.0000 mg | Freq: Once | INTRAMUSCULAR | Status: AC
  Administered 2023-12-06: 10 mg via INTRAVENOUS
  Filled 2023-12-06: qty 1

## 2023-12-06 MED ORDER — SODIUM CHLORIDE 0.9 % IV SOLN
6.2500 mg | Freq: Once | INTRAVENOUS | Status: AC
  Administered 2023-12-06: 6.25 mg via INTRAVENOUS

## 2023-12-06 NOTE — Patient Instructions (Signed)
 Dehydration, Adult Dehydration is a condition in which there is not enough water or other fluids in the body. This happens when a person loses more fluids than they take in. Important organs cannot work right without the right amount of fluids. Any loss of fluids from the body can cause dehydration. Dehydration can be mild, worse, or very bad. It should be treated right away to keep it from getting very bad. What are the causes? Conditions that cause loss of water in the body. They include: Watery poop (diarrhea). Vomiting. Sweating a lot. Fever. Infection. Peeing (urinating) a lot. Not drinking enough fluids. Certain medicines, such as medicines that take extra fluid out of the body (diuretics). Lack of safe drinking water. Not being able to get enough water and food. What increases the risk? Having a long-term (chronic) illness that has not been treated the right way, such as: Diabetes. Heart disease. Kidney disease. Being 25 years of age or older. Having a disability. Living in a place that is high above the ground or sea (high in altitude). The thinner, drier air causes more fluid loss. Doing exercises that put stress on your body for a long time. Being active when in hot places. What are the signs or symptoms? Symptoms of dehydration depend on how bad it is. Mild or worse dehydration Thirst. Dry lips or dry mouth. Feeling dizzy or light-headed. Muscle cramps. Passing little pee or dark pee. Pee may be the color of tea. Headache. Very bad dehydration Changes in skin. Skin may: Be cold to the touch (clammy). Be blotchy or pale. Not go back to normal right after you pinch it and let it go. Little or no tears, pee, or sweat. Fast breathing. Low blood pressure. Weak pulse. Pulse that is more than 100 beats a minute when you are sitting still. Other changes, such as: Feeling very thirsty. Eyes that look hollow (sunken). Cold hands and feet. Being confused. Being very  tired (lethargic) or having trouble waking from sleep. Losing weight. Loss of consciousness. How is this treated? Treatment for this condition depends on how bad your dehydration is. Treatment should start right away. Do not wait until your condition gets very bad. Very bad dehydration is an emergency. You will need to go to a hospital. Mild or worse dehydration can be treated at home. You may be asked to: Drink more fluids. Drink an oral rehydration solution (ORS). This drink gives you the right amount of fluids, salts, and minerals (electrolytes). Very bad dehydration can be treated: With fluids through an IV tube. By correcting low levels of electrolytes in the body. By treating the problem that caused your dehydration. Follow these instructions at home: Oral rehydration solution If told by your doctor, drink an ORS: Make an ORS. Use instructions on the package. Start by drinking small amounts, about  cup (120 mL) every 5-10 minutes. Slowly drink more until you have had the amount that your doctor said to have.  Eating and drinking  Drink enough clear fluid to keep your pee pale yellow. If you were told to drink an ORS, finish the ORS first. Then, start slowly drinking other clear fluids. Drink fluids such as: Water. Do not drink only water. Doing that can make the salt (sodium) level in your body get too low. Water from ice chips you suck on. Fruit juice that you have added water to (diluted). Low-calorie sports drinks. Eat foods that have the right amounts of salts and minerals, such as bananas, oranges, potatoes,  tomatoes, or spinach. Do not drink alcohol. Avoid drinks that have caffeine or sugar. These include:: High-calorie sports drinks. Fruit juice that you did not add water to. Soda. Coffee or energy drinks. Avoid foods that are greasy or have a lot of fat or sugar. General instructions Take over-the-counter and prescription medicines only as told by your doctor. Do  not take sodium tablets. Doing that can make the salt level in your body get too high. Return to your normal activities as told by your doctor. Ask your doctor what activities are safe for you. Keep all follow-up visits. Your doctor may check and change your treatment. Contact a doctor if: You have pain in your belly (abdomen) and the pain: Gets worse. Stays in one place. You have a rash. You have a stiff neck. You get angry or annoyed more easily than normal. You are more tired or have a harder time waking than normal. You feel weak or dizzy. You feel very thirsty. Get help right away if: You have any symptoms of very bad dehydration. You vomit every time you eat or drink. Your vomiting gets worse, does not go away, or you vomit blood or green stuff. You are getting treatment, but symptoms are getting worse. You have a fever. You have a very bad headache. You have: Diarrhea that gets worse or does not go away. Blood in your poop (stool). This may cause poop to look black and tarry. No pee in 6-8 hours. Only a small amount of pee in 6-8 hours, and the pee is very dark. You have trouble breathing. These symptoms may be an emergency. Get help right away. Call 911. Do not wait to see if the symptoms will go away. Do not drive yourself to the hospital. This information is not intended to replace advice given to you by your health care provider. Make sure you discuss any questions you have with your health care provider. Document Revised: 01/04/2022 Document Reviewed: 01/04/2022 Elsevier Patient Education  2024 [employer].

## 2023-12-06 NOTE — Progress Notes (Signed)
 [facility] OFFICE PROGRESS NOTE   Diagnosis: Pancreas cancer  INTERVAL HISTORY:   Alyssa Harper was discharged from the hospital 12/01/2023.  She has persistent nausea, intermittent cramping abdominal pain, and intermittent diarrhea.  She feels weak.  She stays in bed most of the day.  She is here today with her husband and daughter.  She does not feel strong enough to resume chemotherapy.  She is eating and drinking small amounts.  She has mild numbness in the left hand. Ativan  and Phenergan  helped the nausea.  She continues Pepcid  Protonix . Objective:  Vital signs in last 24 hours:  Blood pressure 109/68, pulse 84, temperature 98.6 F (37 C), temperature source Temporal, resp. rate 18, height 5' 5 (1.651 m), weight 112 lb (50.8 kg), SpO2 98%.    HEENT: No thrush or ulcers Resp: Lungs clear bilaterally Cardio: Regular rate and rhythm GI: No hepatosplenomegaly, no mass, nontender Vascular: No leg edema, diminished skin turgor Neuro: Mild loss of vibratory sense at the fingertips bilaterally    Portacath/PICC-without erythema  Lab Results:  Lab Results  Component Value Date   WBC 11.4 (H) 11/30/2023   HGB 11.9 (L) 11/30/2023   HCT 35.6 (L) 11/30/2023   MCV 98.6 11/30/2023   PLT 313 11/30/2023   NEUTROABS 10.1 (H) 11/29/2023    CMP  Lab Results  Component Value Date   NA 134 (L) 11/30/2023   K 3.8 11/30/2023   CL 98 11/30/2023   CO2 28 11/30/2023   GLUCOSE 140 (H) 11/30/2023   BUN 13 11/30/2023   CREATININE 0.58 11/30/2023   CALCIUM  8.6 (L) 11/30/2023   PROT 5.9 (L) 11/29/2023   ALBUMIN 3.4 (L) 11/29/2023   AST 13 (L) 11/29/2023   ALT 16 11/29/2023   ALKPHOS 58 11/29/2023   BILITOT 0.6 11/29/2023   GFRNONAA >60 11/30/2023   GFRAA >60 09/13/2018    Lab Results  Component Value Date   [id number] 19 07/03/2018     Medications: I have reviewed the patient's current medications.   Assessment/Plan: 1.  Pancreas cancer diagnosed in January  2020-FNA of pancreas body lesion: Adenocarcinoma 07/22/2018 distal pancreatectomy and splenectomy, adenocarcinoma with mucinous component, moderately differentiated, 0/16 nodes, negative margins,pT1pN0 Adjuvant FOLFIRINOX completed 02/22/2019 10/13/2023 CT Abdo/pelvis: Distended stomach, no evidence of recurrent tumor or distant metastatic disease 10/18/2023 EGD with stricture, status post a gastrojejunostomy 10/24/2023: Firm mass involving the head of the pancreas and duodenal bulb, serosal implant biopsy positive for adenocarcinoma-moderately differentiated, foundation 1-BRCA2 rearrangement, KRAS G 12V 11/08/2023 CTs at [facility]: Unchanged tumor in the anterior pancreas neck inseparable from the duodenal bulb, new hepatic segment 4 subcapsular lesion increased size and conspicuity of subcentimeter peripancreatic/anterior central mesenteric nodes 11/25/2023: Cycle 1 NALIRIFOX  2.   Admission 11/29/2023 with intractable nausea/vomiting 3.   Persistent nausea, diarrhea, and cramping abdominal pain following cycle 1 NALIRIFOX     Disposition: Alyssa Harper has been diagnosed with locally recurrent pancreas cancer.  She is completing a course of neoadjuvant chemotherapy prior to planned surgery +/- chemotherapy/radiation.  She is now at day 12 following cycle 1 NALIRIFOX.  He has persistent nausea, diarrhea, and cramping abdominal pain.  I suspect her symptoms are related to chemotherapy as opposed to the gastrojejunostomy or abdominal tumor.  She will continue lorazepam  and promethazine  as needed for nausea.  She will receive intravenous fluids and Decadron  today.  She will contact us  in the morning with a report on her symptoms.  We will consider giving additional IV fluids  over the next few days.  She will return for an office visit on 12/12/2023.  We will see her sooner as needed. The plan is to resume chemotherapy when her performance status has improved, currently ECOG 3. Irinotecan will be held from the  chemotherapy regimen and oxaliplatin will be further dose reduced.  Alyssa Deep, MD  12/06/2023  3:48 PM

## 2023-12-06 NOTE — Progress Notes (Signed)
 PATIENT NAVIGATOR PROGRESS NOTE  Name: Falecia Vannatter Date: 12/06/2023 MRN: [id number]  DOB: [birth year]   Reason for visit:  Establish care with Dr Scherrie Curt  Comments:  Met with Mrs Harju and her family during visit with Dr Scherrie Curt  To get IV fluids today with dex and phenergan  for delayed nausea and dehydration Given contact number to call tomorrow to decide if additional IV fluid support is necessary    Time spent counseling/coordinating care: > 60 minutes

## 2023-12-06 NOTE — Progress Notes (Signed)
 Lab orders entered

## 2023-12-06 NOTE — Progress Notes (Signed)
 Orders entered for fluids, dex 10 mg and promethazine  6.25mg 

## 2023-12-07 ENCOUNTER — Ambulatory Visit: Admitting: Nutrition

## 2023-12-07 ENCOUNTER — Inpatient Hospital Stay

## 2023-12-07 VITALS — BP 112/69 | HR 73 | Temp 98.6°F | Resp 17 | Ht 65.0 in | Wt 114.8 lb

## 2023-12-07 DIAGNOSIS — R530 Neoplastic (malignant) related fatigue: Secondary | ICD-10-CM

## 2023-12-07 DIAGNOSIS — Z7189 Other specified counseling: Secondary | ICD-10-CM | POA: Diagnosis not present

## 2023-12-07 DIAGNOSIS — Z5111 Encounter for antineoplastic chemotherapy: Secondary | ICD-10-CM | POA: Diagnosis not present

## 2023-12-07 DIAGNOSIS — Z515 Encounter for palliative care: Secondary | ICD-10-CM

## 2023-12-07 DIAGNOSIS — R630 Anorexia: Secondary | ICD-10-CM | POA: Diagnosis not present

## 2023-12-07 DIAGNOSIS — G893 Neoplasm related pain (acute) (chronic): Secondary | ICD-10-CM

## 2023-12-07 DIAGNOSIS — R634 Abnormal weight loss: Secondary | ICD-10-CM

## 2023-12-07 DIAGNOSIS — C259 Malignant neoplasm of pancreas, unspecified: Secondary | ICD-10-CM

## 2023-12-07 LAB — CBC WITH DIFFERENTIAL (CANCER CENTER ONLY)
Abs Immature Granulocytes: 0.03 10*3/uL (ref 0.00–0.07)
Basophils Absolute: 0 10*3/uL (ref 0.0–0.1)
Basophils Relative: 0 %
Eosinophils Absolute: 0 10*3/uL (ref 0.0–0.5)
Eosinophils Relative: 0 %
HCT: 35.7 % — ABNORMAL LOW (ref 36.0–46.0)
Hemoglobin: 12.1 g/dL (ref 12.0–15.0)
Immature Granulocytes: 0 %
Lymphocytes Relative: 6 %
Lymphs Abs: 0.7 10*3/uL (ref 0.7–4.0)
MCH: 33 pg (ref 26.0–34.0)
MCHC: 33.9 g/dL (ref 30.0–36.0)
MCV: 97.3 fL (ref 80.0–100.0)
Monocytes Absolute: 0.2 10*3/uL (ref 0.1–1.0)
Monocytes Relative: 2 %
Neutro Abs: 9.6 10*3/uL — ABNORMAL HIGH (ref 1.7–7.7)
Neutrophils Relative %: 92 %
Platelet Count: 255 10*3/uL (ref 150–400)
RBC: 3.67 MIL/uL — ABNORMAL LOW (ref 3.87–5.11)
RDW: 12.5 % (ref 11.5–15.5)
WBC Count: 10.5 10*3/uL (ref 4.0–10.5)
nRBC: 0 % (ref 0.0–0.2)

## 2023-12-07 LAB — CMP (CANCER CENTER ONLY)
ALT: 11 U/L (ref 0–44)
AST: 15 U/L (ref 15–41)
Albumin: 4.1 g/dL (ref 3.5–5.0)
Alkaline Phosphatase: 68 U/L (ref 38–126)
Anion gap: 7 (ref 5–15)
BUN: 12 mg/dL (ref 8–23)
CO2: 27 mmol/L (ref 22–32)
Calcium: 8.7 mg/dL — ABNORMAL LOW (ref 8.9–10.3)
Chloride: 101 mmol/L (ref 98–111)
Creatinine: 0.73 mg/dL (ref 0.44–1.00)
GFR, Estimated: >60 mL/min (ref >60)
Glucose, Bld: 118 mg/dL — ABNORMAL HIGH (ref 70–99)
Potassium: 4.6 mmol/L (ref 3.5–5.1)
Sodium: 135 mmol/L (ref 135–145)
Total Bilirubin: 0.2 mg/dL (ref 0.0–1.2)
Total Protein: 6.9 g/dL (ref 6.5–8.1)

## 2023-12-07 NOTE — Progress Notes (Signed)
 63 year old female diagnosed with recurrent pancreas cancer.  She follows at [facility] although receives supportive care with [facility] health. She is followed by Dr. Scherrie Curt.     Pancreas cancer diagnosed in January 2020-FNA of pancreas body lesion: Adenocarcinoma 07/22/2018 distal pancreatectomy and splenectomy, adenocarcinoma with mucinous component, moderately differentiated, 0/16 nodes, negative margins,pT1pN0 Adjuvant FOLFIRINOX completed 02/22/2019 10/13/2023 CT Abdo/pelvis: Distended stomach, no evidence of recurrent tumor or distant metastatic disease 10/18/2023 EGD with stricture, status post a gastrojejunostomy 10/24/2023: Firm mass involving the head of the pancreas and duodenal bulb, serosal implant biopsy positive for adenocarcinoma-moderately differentiated, foundation 1-BRCA2 rearrangement, KRAS G 12V 11/08/2023 CTs at [facility]: Unchanged tumor in the anterior pancreas neck inseparable from the duodenal bulb, new hepatic segment 4 subcapsular lesion increased size and conspicuity of subcentimeter peripancreatic/anterior central mesenteric nodes 11/25/2023: Cycle 1 NALIRIFOX  PMH includes delayed gastric emptying, hypercholesterolemia and Sjogren's Disease  Medications include Ativan , Phenergan , Pepcid , Protonix , Decadron , Zofran .  Labs include Sodium 134 and glucose 140.  Height: 65 inches. Weight: 114 pounds 12.8 ounces June 18. Usual body weight: 115-120 pounds. BMI: 19.01. ECOG: 3.  Patient was admitted on June 10 with intractable nausea/vomiting.  She has had persistent nausea, diarrhea, and cramping abdominal pain following cycle 1.  Provider believes symptoms related to chemotherapy as opposed to gastrojejunostomy or abdominal tumor.  Reports difficulty tolerating lactose.  She does not really like oral nutrition supplements.  Feels discouraged because she has not bounced back from this chemotherapy.  She is not currently nauseous however she is prepared if she should feel like she needs to  vomit.  She reports increased gas. Reports diarrhea is improving.  She does not do well with overly sweet foods or drinks.  Patient at risk for multiple vitamin/mineral deficiencies secondary to gastrojejunostomy/altered digestion and absorption. Common deficiencies include vitamin B12, thiamine, folic acid, and fat-soluble vitamins A, D, and K.   Nutrition diagnosis:  Food and nutrition related knowledge deficit related to cancer and associated treatments as evidenced by no prior nutrition related information.  Intervention: Educated on importance of consuming high-calorie, high-protein foods as tolerated in small amounts throughout the day. Encouraged Ensure clear or other juice based supplement.  Provided tips on decreasing sweetness for better tolerance.  Provided samples. Educated on low fiber diet.  Provided samples of Banatrol. Provided samples of Enterade and educated to drink them as directed to improve gut health. Recommended protein powder supplements to add to beverages and foods. Also provided a sample of [employer] 1.4 as an alternate to ensure. Provided recipes and contact information.  Questions were answered.  Monitoring, evaluation, goals: Patient will tolerate increased calories and protein to minimize weight loss.  Next visit: Monday, June 23.  Encouraged patient to contact RD with questions as needed.  Will recommend monitoring for vitamin deficiency and supplementing as needed with primary provider or [facility] physician.  **Disclaimer: This note was dictated with voice recognition software. Similar sounding words can inadvertently be transcribed and this note may contain transcription errors which may not have been corrected upon publication of note.**

## 2023-12-08 ENCOUNTER — Encounter: Payer: Self-pay | Admitting: *Deleted

## 2023-12-08 ENCOUNTER — Encounter: Payer: Self-pay | Admitting: Nurse Practitioner

## 2023-12-08 ENCOUNTER — Encounter: Payer: Self-pay | Admitting: General Practice

## 2023-12-08 ENCOUNTER — Other Ambulatory Visit: Payer: Self-pay | Admitting: *Deleted

## 2023-12-08 LAB — CANCER ANTIGEN 19-9: CA 19-9: 127 U/mL — ABNORMAL HIGH (ref 0–35)

## 2023-12-08 MED ORDER — MAGIC MOUTHWASH: 5.0000 mL | Freq: Four times a day (QID) | ORAL | 0 refills | Status: AC | PRN

## 2023-12-08 NOTE — Progress Notes (Signed)
 Palliative Medicine [facility]  Telephone:([phone] [phone]) [phone]   Name: Alyssa Harper Date: 12/08/2023 MRN: [id number]  DOB: [birth year]  Patient Care Team: Tisovec, Richard W, MD as PCP - General (Internal Medicine) [facility], [facility] [place] (Family Medicine)    REASON FOR CONSULTATION: Jeweline Reif is a 63 y.o. female with oncologic medical history including recurrent pancreatic cancer (initial diagnosis 06/2018) s/p distal pancreatectomy and splenectomy, FOLFIRNOX, 10/18/2023 EGD with stricture, status post a gastrojejunostomy 10/24/2023 now on NALIRIFOX .  Palliative is seeing patient for symptom management and goals of care.    SOCIAL HISTORY:     reports that she has never smoked. She has never used smokeless tobacco. She reports current alcohol use of about 7.0 standard drinks of alcohol per week. She reports that she does not use drugs.  ADVANCE DIRECTIVES:  Documents on file. Husband Dorri Ozturk and sister, Basil Lim is Clinical research associate.   CODE STATUS: DNR  PAST MEDICAL HISTORY: Past Medical History:  Diagnosis Date   Family history of adverse reaction to anesthesia    grandmother stopped breathing   Headache    weekly (07/03/2018)   High cholesterol    Mitral valve prolapse    Pancreatic mass 07/03/2018   Sjogren's disease (HCC)     PAST SURGICAL HISTORY:  Past Surgical History:  Procedure Laterality Date   AUGMENTATION MAMMAPLASTY Bilateral 2013   ESOPHAGOGASTRODUODENOSCOPY N/A 07/05/2018   Procedure: ESOPHAGOGASTRODUODENOSCOPY (EGD);  Surgeon: Evangeline Hilts, MD;  Location: [facility];  Service: Endoscopy;  Laterality: N/A;   EUS N/A 07/05/2018   Procedure: UPPER ENDOSCOPIC ULTRASOUND (EUS) LINEAR and Radial;  Surgeon: Evangeline Hilts, MD;  Location: [facility];  Service: Endoscopy;  Laterality: N/A;   FINE NEEDLE ASPIRATION N/A 07/05/2018   Procedure: FINE NEEDLE ASPIRATION (FNA) LINEAR;   Surgeon: Evangeline Hilts, MD;  Location: [facility];  Service: Endoscopy;  Laterality: N/A;    HEMATOLOGY/ONCOLOGY HISTORY:  Oncology History  Pancreatic adenocarcinoma (HCC)  07/03/2018 Imaging   MRCP 1. Abrupt caliber change of the pancreatic duct in the mid pancreas associated with an subtle ovoid lesion. Proximal to this mid pancreatic lesion the duct is significantly dilated and distally the duct is normal. Concern for pancreatic neoplasm. Recommend endoscopic ultrasound with tissue sampling.   2. Enhancing lesion in the subcapsular LEFT lateral hepatic lobe is indeterminate but favored small focal nodular hyperplasia or other benign lesion. Metastatic disease is not favored. Additional benign hepatic cysts within the liver.   3.   no evidence of metastatic adenopathy   07/03/2018 Imaging   Ct angiogram of chest and abdominal vessels: Chest CTA impression:   1. No acute cardiopulmonary disease. Specifically, no evidence of thoracic aortic aneurysm or dissection. No evidence of central pulmonary embolism.   Abdomen pelvis CTA impression:   1. No acute findings within the abdomen or pelvis. Specifically, no evidence of abdominal aortic aneurysm, dissection or mesenteric ischemia. 2. Pancreatic ductal dilatation with questioned approximately 6.0 cm hypoattenuating mass involving the proximal body of pancreas, incompletely evaluated on this arterial phase examination. Further evaluation with MRCP is advised. 3. Indeterminate approximately 1.2 cm hyperenhancing lesion within the subcapsular aspect of the left lobe of the liver, potentially representative of a perfusion anomaly or flash filling hemangioma, though incompletely characterized on the present examination. This lesion may also be further evaluated at the time of the recommended MRCP.   07/03/2018 Tumor Marker   Patient's tumor was tested for the following markers: CA-199  Results of the tumor marker test revealed 19    07/03/2018 - 07/05/2018 Hospital Admission   She was admitted to the hospital due to acute lower back pain and was diagnosed with acute pancreatitis.  Further evaluation detected abnormal lesion in the pancreas.  Further EUS and biopsy confirmed adenocarcinoma of the head of the pancreas   07/05/2018 Pathology Results   FINE NEEDLE ASPIRATION, ENDOSCOPIC, PANCREAS BODY(SPECIMEN 1 OF 1 COLLECTED 07/05/18): MALIGNANT CELLS PRESENT, CONSISTENT WITH ADENOCARCINOMA.  THERE IS LIKELY INSUFFICIENT MATERIAL FOR ADDITIONAL STUDIES   07/05/2018 Procedure   EUS report No lymphadenopathy seen. There was no sign of significant endosonographic abnormality in the left lobe of the liver. No focal pathology was identified. There was no sign of significant endosonographic abnormality in the common bile duct. An unremarkable gallbladder was identified. A round mass was identified in the pancreatic body. The mass was isoechoic. The mass measured 15 mm by 19 mm in maximal cross-sectional diameter. The outer margins were smooth. An intact interface was seen between the mass and the superior mesenteric artery and celiac artery, suggesting a lack of invasion. No obvious venous vascular involvement. The remainder of the pancreas was examined. The endosonographic appearance of parenchyma and the upstream pancreatic duct indicated duct dilation.  Fine needle aspiration for cytology was performed. Color Doppler imaging was utilized prior to needle puncture to confirm a lack of significant vascular structures within the needle path. Four passes were made with the 25 gauge needle using a transgastric approach. A stylet was used. A cytotechnologist was present to evaluate the adequacy of the specimen. The cellularity of the specimen was adequate. Final cytology results are pending. Estimated blood loss: none.  Summary A round mass was identified in the pancreatic body. The mass was isoechoic. The mass measured 15 mm by 19 mm in  maximal cross-sectional diameter. The outer margins were smooth. An intact interface was seen between the mass and the superior mesenteric artery and celiac artery, suggesting a lack of invasion. No obvious venous vascular involvement. The remainder of the pancreas was examined. The endosonographic appearance of parenchyma and the upstream pancreatic duct indicated duct dilation.  Fine needle aspiration for cytology was performed. Color Doppler imaging was utilized prior to needle puncture to confirm a lack of significant vascular structures within the needle path. Four passes were made with the 25 gauge needle using a transgastric approach. A stylet was used. A cytotechnologist was present to evaluate the adequacy of the specimen. The cellularity of the specimen was adequate. Final cytology results are pending. Estimated blood loss: none.  Impression - There was no evidence of significant pathology in the left lobe of the liver. - There was no sign of significant pathology in the common bile duct. - A mass was identified in the pancreatic body. Fine needle aspiration performed.   07/07/2018 Cancer Staging   Staging form: Exocrine Pancreas, AJCC 8th Edition - Clinical stage from 07/07/2018: Stage IA (cT1c, cN0, cM0) - Signed by Almeda Jacobs, MD on 07/07/2018   07/14/2018 Imaging   Ct scan of abdomen and pelvis Baseline Ct done in [place]   07/22/2018 Surgery   She had surgery in [facility] by Dr. William Jarnagin  Procedure: Laparoscopic distal pancreatectomy, laparoscopic splenectomy, explorotomy celiotomy, biopsies and LN removal   07/22/2018 Pathology Results   Adenocarcinoma of pancreas with 80% mucinous component, moderately differentiated, located on the body of pancreas, 1.8 cm greatest dimension, negative margin. No lymphovascular invasion; perineural invasion seen. Closest margin 0.7  cm proximal margin None of 16 LN were involved   07/28/2018 Imaging   Ct scan of abdomen and pelvis  in [place] Fluid collection noted near the resection area.   07/31/2018 Procedure   She underwent EUS which revealed a cystic lesion in the pancreatic tail. Cystogastrostomy was performed   09/07/2018 -  Chemotherapy   She received chemotherapy at [facility] with folfirinox with 20% reduction of each chemo      ALLERGIES:  is allergic to tape, wound dressing adhesive, pollen extract, and penicillins.  MEDICATIONS:  Current Outpatient Medications  Medication Sig Dispense Refill   dexamethasone  (DECADRON ) 4 MG tablet Take 4 mg by mouth daily. Take 1 tablet (4 mg total) by mouth daily with breakfast for 14 days For 7 days, then 1/2 tablet (2mg  total) PO with breakfast.     dicyclomine (BENTYL) 10 MG capsule Take 10 mg by mouth 4 (four) times daily -  before meals and at bedtime.     CREON 24000-76000 units CPEP Take 24,000 Units by mouth daily as needed (to aid food digestion- as determined by the patient). (Patient not taking: Reported on 12/06/2023)     dexamethasone  (DECADRON ) 4 MG tablet Take 8 mg by mouth See admin instructions. Take 8 mg (2 tablets) by mouth once a day for 2 days after chemo     famotidine  (PEPCID ) 40 MG tablet Take 40 mg by mouth 2 (two) times daily before a meal.     lidocaine-prilocaine (EMLA) cream Apply 1 application  topically as needed (for port access).     LORazepam  (ATIVAN ) 0.5 MG tablet Place 1 tablet (0.5 mg total) under the tongue every 6 (six) hours as needed (nausea). 20 tablet 0   neomycin-polymyxin-dexameth (MAXITROL) 0.1 % OINT Place 1 Application into the left eye 2 (two) times daily as needed (for irritation).     ondansetron  (ZOFRAN ) 8 MG tablet Take 8 mg by mouth every 8 (eight) hours as needed for nausea or vomiting.     oxyCODONE  (OXY IR/ROXICODONE ) 5 MG immediate release tablet Take 2.5-5 mg by mouth every 4 (four) hours as needed (for pain).     pantoprazole  (PROTONIX ) 40 MG tablet Take 1 tablet (40 mg total) by mouth daily. 90 tablet 1   promethazine   (PHENERGAN ) 12.5 MG tablet Take 12.5 mg by mouth every 6 (six) hours as needed for nausea or vomiting.     sodium chloride  0.9 % infusion Inject 750-1,000 mLs into the vein See admin instructions. Inject 750-1,000 ml's into the vein TWICE WEEKLY after chemotherapy that occurs every 2 weeks (Patient not taking: Reported on 12/06/2023)     TYLENOL  500 MG tablet Take 250 mg by mouth every 6 (six) hours as needed for mild pain (pain score 1-3) or headache.     No current facility-administered medications for this visit.    VITAL SIGNS: BP 112/69 (BP Location: Right Arm, Patient Position: Sitting)   Pulse 73   Temp 98.6 F (37 C) (Temporal)   Resp 17   Ht 5' 5 (1.651 m)   Wt 114 lb 12.8 oz (52.1 kg)   SpO2 98%   BMI 19.10 kg/m  Filed Weights   12/07/23 1408  Weight: 114 lb 12.8 oz (52.1 kg)    Estimated body mass index is 19.1 kg/m as calculated from the following:   Height as of this encounter: 5' 5 (1.651 m).   Weight as of this encounter: 114 lb 12.8 oz (52.1 kg).  LABS: CBC:  Component Value Date/Time   WBC 10.5 12/07/2023 1545   WBC 11.4 (H) 11/30/2023 1848   HGB 12.1 12/07/2023 1545   HCT 35.7 (L) 12/07/2023 1545   PLT 255 12/07/2023 1545   MCV 97.3 12/07/2023 1545   NEUTROABS 9.6 (H) 12/07/2023 1545   LYMPHSABS 0.7 12/07/2023 1545   MONOABS 0.2 12/07/2023 1545   EOSABS 0.0 12/07/2023 1545   BASOSABS 0.0 12/07/2023 1545   Comprehensive Metabolic Panel:    Component Value Date/Time   NA 135 12/07/2023 1545   K 4.6 12/07/2023 1545   CL 101 12/07/2023 1545   CO2 27 12/07/2023 1545   BUN 12 12/07/2023 1545   CREATININE 0.73 12/07/2023 1545   GLUCOSE 118 (H) 12/07/2023 1545   CALCIUM  8.7 (L) 12/07/2023 1545   AST 15 12/07/2023 1545   ALT 11 12/07/2023 1545   ALKPHOS 68 12/07/2023 1545   BILITOT 0.2 12/07/2023 1545   PROT 6.9 12/07/2023 1545   ALBUMIN 4.1 12/07/2023 1545    RADIOGRAPHIC STUDIES: DG Chest Port 1 View Result Date: 11/27/2023 CLINICAL DATA:   [id number] Chest pain [id number] EXAM: PORTABLE CHEST - 1 VIEW COMPARISON:  09/15/2018. FINDINGS: Cardiac silhouette is unremarkable. No pneumothorax or pleural effusion. The lungs are clear. The visualized skeletal structures are unremarkable. Right-sided Port-A-Cath tip overlies distal SVC. IMPRESSION: No acute cardiopulmonary process. Electronically Signed   By: Sydell Eva M.D.   On: 11/27/2023 07:07    PERFORMANCE STATUS (ECOG) : 1 - Symptomatic but completely ambulatory  Review of Systems  Constitutional:  Positive for activity change, appetite change and fatigue.  Unless otherwise noted, a complete review of systems is negative.  Physical Exam General: NAD Cardiovascular: regular rate and rhythm Pulmonary: clear ant fields Abdomen: soft, nontender, + bowel sounds Extremities: no edema, no joint deformities Skin: no rashes Neurological: Alert and oriented x3  IMPRESSION: Discussed the use of AI scribe software for clinical note transcription with the patient, who gave verbal consent to proceed.  History of Present Illness Elnita Surprenant is a 63 year old female with recurrent pacreatic cancer who presents for initial outpatient palliative visits. She is reporting gassy cramping and occasional loose stools. She is accompanied by her husband. Is doing much better than previous week which require hospitalization for weakness, abdominal pain, and intractable nausea, vomiting, diarrhea s/p treatment. Patient is also followed at [facility] with Oncology in addition to establishing care with Dr. Sonda Dural @ [facility].   I introduced myself, Maygan RN, and Palliative's role in collaboration with the oncology team. Concept of Palliative Care was introduced as specialized medical care for people and their families living with serious illness.  It focuses on providing relief from the symptoms and stress of a serious illness.  The goal is to improve quality of life for both the patient and the family. Values  and goals of care important to patient and family were attempted to be elicited.   Mrs. Jenkin lives in the home with her husband. 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 Her daughter and husband provide significant support, and she is considering hiring additional help to manage household tasks.  Patient reports health has improved compared to previous weeks. She experiences gassy cramping a couple of hours after eating, which can also wake her up at night. Stools are looser than normal but not much diarrhea. She has been discussing dietary adjustments with a nutritionist and  is considering trying simethicone  (Gas-X) for relief, although she has not used it before.   She is currently taking dexamethasone , having started with 10 mg in the hospital and now taking 8 mg daily. This dosage has helped her get out of bed and function. She has been on this dose for two days and received 10 mg in her IV previously. She has a history of osteoporosis and is aware of the need to titrate down her dexamethasone  dose. Upon chart review [facility] recommendations for 4mg  x7 days and 2mg  x7 days. We discussed at length. She knows dose adjustments may be needed as associated with her treatment regimen. Patient has been advised to continue with 8mg  x3 days and then proceed with titration down to 4mg  followed by 2mg  as mentioned. She and husband verbalized understanding.   Mrs. Lazar is not currently taking Creon but wonders if her symptoms might be related to not processing food properly. She has Creon at on-hand at home. Complains of abdominal discomfort, gassy, cramping. Education provided on use of Creon. She is considering resuming as needed.   Persistent nausea is improving. She was given Reglan  in the emergency room but found Zofran  more effective for nausea, although she has not needed it much in the last few days. She  received fluids recently and was prescribed bentyl for cramping, which she has not yet taken.  Her medication list includes Pepcid , Ativan , and Protonix  at night. She has not been using Afrin nasal spray or Carafate , finding the latter unappealing. Medication list updated.   She is cautious about trying new treatments but is open to options that might improve her quality of life. She would like maintenance fluids post treatment to prevent hospitalization and dehydration in the future. Understands this can be arranged upon discussing treatment plan with Dr. Scherrie Curt at follow-up visit June 23rd.   All questions answered and support provided.   We discussed her current illness and what it means in the larger context of her on-going co-morbidities. Natural disease trajectory and expectations were discussed. Mrs. Giammarino and her family are remaining hopeful for stability. She tolerated first round of treatment at initial diagnosis and quality of life improved. She is remaining hopeful for similar response with realistic understanding and awareness of reoccurrence and different clinical path without surgical intervention.   Mrs. Datta is clear in her express wishes to continue to treat the treatable allowing her every opportunity to continue thriving. Her quality of life is most important to her.   I discussed the importance of continued conversation with family and their medical providers regarding overall plan of care and treatment options, ensuring decisions are within the context of the patients values and GOCs. Assessment & Plan Established therapeutic relationship. Education provided on palliative's role in collaboration with their Oncology/Radiation team.  Pancreatic Cancer  Undergoing chemotherapy with side effects. Plans to adjust regimen to exclude irinotecan next cycle per patient.  - Adjust chemotherapy regimen to exclude irinotecan next cycle per Oncology.  - Implement supportive care plan  with fluids and symptom management team.  Gastrointestinal symptoms Experiencing gassy cramping and loose stools. Potential causes include lack of Creon and chemotherapy effects. - Start simethicone  as needed for gas relief. - Resume Creon as needed for digestive support. - Use Enterade as directed for diarrhea management. - Consider dicyclomine for cramping if needed.  Sjogren's syndrome Experiencing dryness exacerbated by chemotherapy. - Ensure adequate hydration.  Osteoporosis Currently on dexamethasone , which can affect bone density. Plan to titrate down  dosage. - Titrate dexamethasone  from 8 mg to 4 mg over the next week. - Follow instructions for further reduction of dexamethasone . - Limit 8 mg dose to 14 days, extend to 21 days if necessary.  Goals of Care Focused on maintaining quality of life and managing symptoms effectively. Discussed plans for symptom management. - Advanced Directives on file -Patient clear in expressed wishes to continue to treat the treatable.   Follow-up I will plan to see patient back in 4-6 weeks. Sooner if needed. She knows to contact office for visit after next oncology visit.   Patient expressed understanding and was in agreement with this plan. She also understands that She can call the clinic at any time with any questions, concerns, or complaints.   Thank you for your referral and allowing Palliative to assist in Mrs. Berit Marinaro's care.   Number and complexity of problems addressed: HIGH - 1 or more chronic illnesses with SEVERE exacerbation, progression, or side effects of treatment - advanced cancer, pain. Any controlled substances utilized were prescribed in the context of palliative care.  Visit consisted of counseling and education dealing with the complex and emotionally intense issues of symptom management and palliative care in the setting of serious and potentially life-threatening illness.  Signed by: Dellia Ferguson,  AGPCNP-BC Palliative Medicine Team/[facility]

## 2023-12-08 NOTE — Progress Notes (Signed)
 [facility] Spiritual Care Note  Referred by [facility] team to introduce Spiritual Care as an additional layer of support available. [name] reports strong support from her church, [facility], and her pastor there. The church community is currently meeting most of her spiritual needs, but [name] also has chaplain's direct number in case needs arise or circumstances change.   [address] Dorice Harper, [place], [facility] Pager [phone] Voicemail [phone]

## 2023-12-08 NOTE — Progress Notes (Signed)
 Returned call of pt and husband regarding sore throat, gums and feels' raw, no fever or other symptoms.  Able to eat and drink well today.  discussed with Dr Scherrie Curt and escribed [employer] to [facility] for compounding.

## 2023-12-09 ENCOUNTER — Other Ambulatory Visit: Payer: Self-pay | Admitting: *Deleted

## 2023-12-09 DIAGNOSIS — C259 Malignant neoplasm of pancreas, unspecified: Secondary | ICD-10-CM

## 2023-12-12 ENCOUNTER — Inpatient Hospital Stay: Admitting: Nutrition

## 2023-12-12 ENCOUNTER — Inpatient Hospital Stay (HOSPITAL_BASED_OUTPATIENT_CLINIC_OR_DEPARTMENT_OTHER): Admitting: Oncology

## 2023-12-12 ENCOUNTER — Other Ambulatory Visit: Payer: Self-pay | Admitting: Oncology

## 2023-12-12 ENCOUNTER — Inpatient Hospital Stay

## 2023-12-12 ENCOUNTER — Other Ambulatory Visit: Payer: Self-pay | Admitting: *Deleted

## 2023-12-12 VITALS — BP 98/64 | HR 83 | Temp 97.8°F | Resp 18 | Ht 65.0 in | Wt 113.1 lb

## 2023-12-12 DIAGNOSIS — T451X5A Adverse effect of antineoplastic and immunosuppressive drugs, initial encounter: Secondary | ICD-10-CM

## 2023-12-12 DIAGNOSIS — C259 Malignant neoplasm of pancreas, unspecified: Secondary | ICD-10-CM

## 2023-12-12 DIAGNOSIS — Z5111 Encounter for antineoplastic chemotherapy: Secondary | ICD-10-CM | POA: Diagnosis not present

## 2023-12-12 LAB — CBC WITH DIFFERENTIAL (CANCER CENTER ONLY)
Abs Immature Granulocytes: 0.06 10*3/uL (ref 0.00–0.07)
Basophils Absolute: 0.1 10*3/uL (ref 0.0–0.1)
Basophils Relative: 1 %
Eosinophils Absolute: 0.5 10*3/uL (ref 0.0–0.5)
Eosinophils Relative: 9 %
HCT: 34.2 % — ABNORMAL LOW (ref 36.0–46.0)
Hemoglobin: 11.6 g/dL — ABNORMAL LOW (ref 12.0–15.0)
Immature Granulocytes: 1 %
Lymphocytes Relative: 38 %
Lymphs Abs: 2.1 10*3/uL (ref 0.7–4.0)
MCH: 33.2 pg (ref 26.0–34.0)
MCHC: 33.9 g/dL (ref 30.0–36.0)
MCV: 98 fL (ref 80.0–100.0)
Monocytes Absolute: 0.8 10*3/uL (ref 0.1–1.0)
Monocytes Relative: 15 %
Neutro Abs: 2 10*3/uL (ref 1.7–7.7)
Neutrophils Relative %: 36 %
Platelet Count: 431 10*3/uL — ABNORMAL HIGH (ref 150–400)
RBC: 3.49 MIL/uL — ABNORMAL LOW (ref 3.87–5.11)
RDW: 12.8 % (ref 11.5–15.5)
WBC Count: 5.6 10*3/uL (ref 4.0–10.5)
nRBC: 0 % (ref 0.0–0.2)

## 2023-12-12 LAB — CMP (CANCER CENTER ONLY)
ALT: 18 U/L (ref 0–44)
AST: 20 U/L (ref 15–41)
Albumin: 3.6 g/dL (ref 3.5–5.0)
Alkaline Phosphatase: 81 U/L (ref 38–126)
Anion gap: 12 (ref 5–15)
BUN: 9 mg/dL (ref 8–23)
CO2: 24 mmol/L (ref 22–32)
Calcium: 8.9 mg/dL (ref 8.9–10.3)
Chloride: 101 mmol/L (ref 98–111)
Creatinine: 0.56 mg/dL (ref 0.44–1.00)
GFR, Estimated: >60 mL/min (ref >60)
Glucose, Bld: 124 mg/dL — ABNORMAL HIGH (ref 70–99)
Potassium: 4.1 mmol/L (ref 3.5–5.1)
Sodium: 137 mmol/L (ref 135–145)
Total Bilirubin: <0.2 mg/dL (ref 0.0–1.2)
Total Protein: 6.3 g/dL — ABNORMAL LOW (ref 6.5–8.1)

## 2023-12-12 MED ORDER — LORAZEPAM 0.5 MG PO TABS: 0.5000 mg | ORAL_TABLET | Freq: Four times a day (QID) | ORAL | 1 refills | Status: DC | PRN

## 2023-12-12 MED ORDER — CREON 24000-76000 UNITS PO CPEP: 24000.0000 [IU] | ORAL_CAPSULE | Freq: Three times a day (TID) | ORAL | 1 refills | Status: DC | PRN

## 2023-12-12 NOTE — Progress Notes (Unsigned)
 Appointment changed to telephone visit.  Patient receiving NALIRIFOX for recurrent pancreas cancer.  Reports she is feeling a little better. She has not tried nutrition drinks yet because she is eating a little better. She has increased gas. Doesn't report any issues with dumping syndrome but usually doesn't eat sweets. She has been taking a pancreatic enzyme but discovered it has expired so needs a new prescription. She has not been instructed about possible vitamin mineral supplementation due to recent gastrojejunostomy procedure. States she used to take calcium  and Vitamin D for osteoporosis but stopped recently due to recurrence and not knowing what she can and should not take. She is very interested in resuming her vitamin D and calcium .  Weight slightly decreased to 113 pounds 1.6 oz from 114 pounds 12.8 ounces June 18.  Labs include Glucose 124.  Nutrition Diagnosis: Food and Nutrition Related Knowledge deficit continues.  Intervention: Continue small amounts of food throughout the day. Encourage higher calorie, higher protein foods. Recommend trial of pancreatic enzymes. Recommend testing for vitamin deficiencies and supplementing with at least 3000 IU of vitamin D3, 12 mg of thiamine, 18-60 mg of iron, and 350-500 mcg of vitamin B12 to prevent common deficiencies after surgery. (ASMBS recommendations)  Monitoring, Evaluation, Goals: Tolerate increased calories and protein to minimize weight loss.  Next Visit: To be scheduled with upcoming treatment as needed.

## 2023-12-12 NOTE — Progress Notes (Signed)
 Per Dr. Cloretta: Needs 1 liter NS on day 3 and day 4 of chemo cycle w/IV decadron  on day 3. Orders placed. Scheduler notified.

## 2023-12-12 NOTE — Progress Notes (Signed)
 [facility] OFFICE PROGRESS NOTE   Diagnosis: Pancreas cancer  INTERVAL HISTORY:   Ms. Dugue returns as scheduled.  She reports feeling much better after receiving intravenous fluids, Decadron , and Phenergan  on 12/06/2023.  She is now tolerating a diet.  She was able to walk outside yesterday and plans to return to work today. No peripheral numbness.  She has weakness of the hands.  She has hyperactive bowel sounds. Ms. Bloomfield would like to proceed with cycle 2 chemotherapy this week.  Objective:  Vital signs in last 24 hours:  Blood pressure 98/64, pulse 83, temperature 97.8 F (36.6 C), temperature source Temporal, resp. rate 18, height 5' 5 (1.651 m), weight 113 lb 1.6 oz (51.3 kg), SpO2 100%.    HEENT: No thrush or ulcers Resp: Lungs clear bilaterally Cardio: Regular rate and rhythm GI: No hepatosplenomegaly, no mass, nontender Vascular: No leg edema  Portacath/PICC-without erythema  Lab Results:  Lab Results  Component Value Date   WBC 5.6 12/12/2023   HGB 11.6 (L) 12/12/2023   HCT 34.2 (L) 12/12/2023   MCV 98.0 12/12/2023   PLT 431 (H) 12/12/2023   NEUTROABS 2.0 12/12/2023    CMP  Lab Results  Component Value Date   NA 135 12/07/2023   K 4.6 12/07/2023   CL 101 12/07/2023   CO2 27 12/07/2023   GLUCOSE 118 (H) 12/07/2023   BUN 12 12/07/2023   CREATININE 0.73 12/07/2023   CALCIUM  8.7 (L) 12/07/2023   PROT 6.9 12/07/2023   ALBUMIN 4.1 12/07/2023   AST 15 12/07/2023   ALT 11 12/07/2023   ALKPHOS 68 12/07/2023   BILITOT 0.2 12/07/2023   GFRNONAA >60 12/07/2023   GFRAA >60 09/13/2018    Lab Results  Component Value Date   RJW800 127 (H) 12/07/2023    No results found for: INR, LABPROT  Imaging:  No results found.  Medications: I have reviewed the patient's current medications.   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-FNA of pancreas body lesion: Adenocarcinoma 07/22/2018 distal pancreatectomy and  splenectomy, adenocarcinoma with mucinous component, moderately differentiated, 0/16 nodes, negative margins,pT1pN0 Adjuvant FOLFIRINOX completed 02/22/2019 10/13/2023 CT Abdo/pelvis: Distended stomach, no evidence of recurrent tumor or distant metastatic disease 10/18/2023 EGD with stricture, status post a gastrojejunostomy 10/24/2023: Firm mass involving the head of the pancreas and duodenal bulb, serosal implant biopsy positive for adenocarcinoma-moderately differentiated, foundation 1-BRCA2 rearrangement, KRAS G 12V 11/08/2023 CTs at [facility]: Unchanged tumor in the anterior pancreas neck inseparable from the duodenal bulb, new hepatic segment 4 subcapsular lesion increased size and conspicuity of subcentimeter peripancreatic/anterior central mesenteric nodes 11/25/2023: Cycle 1 NALIRIFOX 12/14/2023: Cycle 2 NALIRIFOX, oxaliplatin further dose reduced and irinotecan held, G-CSF added  2.   Admission 11/29/2023 with intractable nausea/vomiting 3.   Persistent nausea, diarrhea, and cramping abdominal pain following cycle 1 NALIRIFOX      Disposition: Ms. Gierke completed 1 cycle of neoadjuvant reduced NALIRIFOX.  Treatment was complicated by prolonged nausea and diarrhea.  Her performance status appears much improved today.  She would like to proceed with cycle 2 this week.  We discussed dosing options for cycle 2.  I suspect her symptoms were in part related to irinotecan.  Irinotecan will be held from cycle 2.  Oxaliplatin will be reduced to the dose she was taking while on FOLFIRINOX.  She will continue 5-FU with the current dose.  The plan is to begin cycle 2 on 12/14/2023.  She will receive G-CSF support and IV fluids following chemotherapy.  Ativan  has helped nausea and anxiety.  We refilled her prescription for Ativan  today.  A treatment plan was entered today.  Arley Hof, MD  12/12/2023  9:35 AM

## 2023-12-12 NOTE — Progress Notes (Signed)
 START OFF PATHWAY REGIM[facility] - Pancreatic Adenocarcinoma   [id number]:NALIRIFOX (Liposomal Irinotecan IV D1,15 + Fluorouracil CIV D1,2,15,16 + Leucovorin IV D1,15 + Oxaliplatin IV D1,15) q28 Days:   A cycle is every 28 days:     Liposomal irinotecan      Oxaliplatin      Leucovorin      Fluorouracil   **Always confirm dose/schedule in your pharmacy ordering system**  Patient Characteristics: Preoperative, M0 (Clinical Staging), Borderline Resectable, PS = 0,1, BRCA1/2 or PALB2 Mutation Present Therapeutic Status: Preoperative, M0 (Clinical Staging) AJCC T Category: Staged < 8th Ed. AJCC N Category: Staged < 8th Ed. Resectability Status: Borderline Resectable AJCC M Category: Staged < 8th Ed. AJCC 8 Stage Grouping: Staged < 8th Ed. ECOG Performance Status: 1 BRCA1/2 Mutation Status: Present PALB2 Mutation Status: Absent Intent of Therapy: Curative Intent, Discussed with Patient

## 2023-12-13 ENCOUNTER — Inpatient Hospital Stay

## 2023-12-13 ENCOUNTER — Encounter: Payer: Self-pay | Admitting: Oncology

## 2023-12-13 VITALS — BP 91/61 | HR 80 | Temp 98.1°F | Resp 18

## 2023-12-13 DIAGNOSIS — C259 Malignant neoplasm of pancreas, unspecified: Secondary | ICD-10-CM

## 2023-12-13 DIAGNOSIS — Z5111 Encounter for antineoplastic chemotherapy: Secondary | ICD-10-CM | POA: Diagnosis not present

## 2023-12-13 MED ORDER — PALONOSETRON HCL INJECTION 0.25 MG/5ML
0.2500 mg | Freq: Once | INTRAVENOUS | Status: AC
  Administered 2023-12-13: 0.25 mg via INTRAVENOUS
  Filled 2023-12-13: qty 5

## 2023-12-13 MED ORDER — SODIUM CHLORIDE 0.9 % IV SOLN
1200.0000 mg/m2 | INTRAVENOUS | Status: DC
  Administered 2023-12-13: 2000 mg via INTRAVENOUS
  Filled 2023-12-13: qty 40

## 2023-12-13 MED ORDER — SODIUM CHLORIDE 0.9 % IV SOLN
150.0000 mg | Freq: Once | INTRAVENOUS | Status: AC
  Administered 2023-12-13: 150 mg via INTRAVENOUS
  Filled 2023-12-13: qty 150

## 2023-12-13 MED ORDER — DIPHENHYDRAMINE HCL 25 MG PO CAPS
25.0000 mg | ORAL_CAPSULE | Freq: Once | ORAL | Status: AC
  Administered 2023-12-13: 25 mg via ORAL
  Filled 2023-12-13: qty 1

## 2023-12-13 MED ORDER — DEXTROSE 5 % IV SOLN: INTRAVENOUS | Status: AC

## 2023-12-13 MED ORDER — FAMOTIDINE IN NACL 20-0.9 MG/50ML-% IV SOLN
20.0000 mg | Freq: Once | INTRAVENOUS | Status: AC
  Administered 2023-12-13: 20 mg via INTRAVENOUS
  Filled 2023-12-13: qty 50

## 2023-12-13 MED ORDER — OXALIPLATIN CHEMO INJECTION 100 MG/20ML
42.5000 mg/m2 | Freq: Once | INTRAVENOUS | Status: AC
  Administered 2023-12-13: 65 mg via INTRAVENOUS
  Filled 2023-12-13: qty 13

## 2023-12-13 MED ORDER — DEXAMETHASONE SODIUM PHOSPHATE 10 MG/ML IJ SOLN
10.0000 mg | Freq: Once | INTRAMUSCULAR | Status: AC
  Administered 2023-12-13: 10 mg via INTRAVENOUS
  Filled 2023-12-13: qty 1

## 2023-12-13 MED ORDER — SODIUM CHLORIDE 0.9 % IV SOLN: INTRAVENOUS | Status: DC

## 2023-12-13 NOTE — Patient Instructions (Addendum)
 [facility] - A DEPT OF [place]  Discharge Instructions: Thank you for choosing [facility] to provide your oncology and hematology care.   If you have a lab appointment with the Cancer Center, please go directly to the Cancer Center and check in at the registration area.   Wear comfortable clothing and clothing appropriate for easy access to any Portacath or PICC line.   We strive to give you quality time with your provider. You may need to reschedule your appointment if you arrive late (15 or more minutes).  Arriving late affects you and other patients whose appointments are after yours.  Also, if you miss three or more appointments without notifying the office, you may be dismissed from the clinic at the provider's discretion.      For prescription refill requests, have your pharmacy contact our office and allow 72 hours for refills to be completed.    Today you received the following chemotherapy and/or immunotherapy agents: Oxaliplatin and Fluorouracil (5-FU, Adrucil)   To help prevent nausea and vomiting after your treatment, we encourage you to take your nausea medication as directed.  BELOW ARE SYMPTOMS THAT SHOULD BE REPORTED IMMEDIATELY: *FEVER GREATER THAN 100.4 F (38 C) OR HIGHER *CHILLS OR SWEATING *NAUSEA AND VOMITING THAT IS NOT CONTROLLED WITH YOUR NAUSEA MEDICATION *UNUSUAL SHORTNESS OF BREATH *UNUSUAL BRUISING OR BLEEDING *URINARY PROBLEMS (pain or burning when urinating, or frequent urination) *BOWEL PROBLEMS (unusual diarrhea, constipation, pain near the anus) TENDERNESS IN MOUTH AND THROAT WITH OR WITHOUT PRESENCE OF ULCERS (sore throat, sores in mouth, or a toothache) UNUSUAL RASH, SWELLING OR PAIN  UNUSUAL VAGINAL DISCHARGE OR ITCHING   Items with * indicate a potential emergency and should be followed up as soon as possible or go to the Emergency Department if any problems should occur.  Please show the  CHEMOTHERAPY ALERT CARD or IMMUNOTHERAPY ALERT CARD at check-in to the Emergency Department and triage nurse.  Should you have questions after your visit or need to cancel or reschedule your appointment, please contact [facility] - A DEPT OF [facility]  Dept: [phone]  and follow the prompts.  Office hours are 8:00 a.m. to 4:30 p.m. Monday - Friday. Please note that voicemails left after 4:00 p.m. may not be returned until the following business day.  We are closed weekends and major holidays. You have access to a nurse at all times for urgent questions. Please call the main number to the clinic Dept: [phone] and follow the prompts.   For any non-urgent questions, you may also contact your provider using MyChart. We now offer e-Visits for anyone 15 and older to request care online for non-urgent symptoms. For details visit [email or web address].   Also download the MyChart app! Go to the app store, search MyChart, open the app, select [place], and log in with your MyChart username and password.

## 2023-12-15 ENCOUNTER — Inpatient Hospital Stay: Admitting: Nutrition

## 2023-12-15 ENCOUNTER — Inpatient Hospital Stay

## 2023-12-15 ENCOUNTER — Encounter

## 2023-12-15 VITALS — BP 122/70 | HR 71 | Temp 98.2°F | Resp 18

## 2023-12-15 DIAGNOSIS — C259 Malignant neoplasm of pancreas, unspecified: Secondary | ICD-10-CM

## 2023-12-15 DIAGNOSIS — Z5111 Encounter for antineoplastic chemotherapy: Secondary | ICD-10-CM | POA: Diagnosis not present

## 2023-12-15 DIAGNOSIS — R112 Nausea with vomiting, unspecified: Secondary | ICD-10-CM

## 2023-12-15 MED ORDER — DEXAMETHASONE 4 MG PO TABS
8.0000 mg | ORAL_TABLET | Freq: Once | ORAL | Status: AC
  Administered 2023-12-15: 8 mg via ORAL
  Filled 2023-12-15: qty 2

## 2023-12-15 MED ORDER — HEPARIN SOD (PORK) LOCK FLUSH 100 UNIT/ML IV SOLN
500.0000 [IU] | Freq: Once | INTRAVENOUS | Status: AC | PRN
  Administered 2023-12-15: 500 [IU]

## 2023-12-15 MED ORDER — PEGFILGRASTIM-JMDB 6 MG/0.6ML ~~LOC~~ SOSY
6.0000 mg | PREFILLED_SYRINGE | Freq: Once | SUBCUTANEOUS | Status: AC
  Administered 2023-12-15: 6 mg via SUBCUTANEOUS
  Filled 2023-12-15: qty 0.6

## 2023-12-15 MED ORDER — DEXAMETHASONE SODIUM PHOSPHATE 10 MG/ML IJ SOLN: 10.0000 mg | Freq: Once | INTRAMUSCULAR | Status: DC

## 2023-12-15 MED ORDER — SODIUM CHLORIDE 0.9 % IV SOLN: INTRAVENOUS | Status: DC

## 2023-12-15 MED ORDER — SODIUM CHLORIDE 0.9% FLUSH
10.0000 mL | INTRAVENOUS | Status: DC | PRN
  Administered 2023-12-15: 10 mL

## 2023-12-15 NOTE — Progress Notes (Signed)
 Patient requested more information on vitamin mineral supplementation for gastrojejunostomy. Educated patient to request testing and work with MD on supplementation as needed. Provided [place] guidelines for recommended supplementation.

## 2023-12-15 NOTE — Patient Instructions (Signed)
 Rehydration, Adult Rehydration is the replacement of fluids, salts, and minerals in the body (electrolytes) that are lost during dehydration. Dehydration is when there is not enough water or other fluids in the body. This happens when you lose more fluids than you take in. Common causes of dehydration include: Not drinking enough fluids. This can occur when you are ill or doing activities that require a lot of energy, especially in hot weather. Conditions that cause loss of water or other fluids. These include diarrhea, vomiting, sweating, and urinating a lot. Other illnesses, such as fever or infection. Certain medicines, such as those that remove excess fluid from the body (diuretics). Symptoms of mild or moderate dehydration may include thirst, dry lips and mouth, and dizziness. Symptoms of severe dehydration may include increased heart rate, confusion, fainting, and not urinating. In severe cases, you may need to get fluids through an IV at the hospital. For mild or moderate cases, you can usually rehydrate at home by drinking certain fluids as told by your health care provider. What are the risks? Your health care provider will talk with you about risks. Your health care provider will talk with you about risks. This may include taking in too much fluid (overhydration). This is rare. Overhydration can cause an imbalance of electrolytes in the body, kidney failure, or a decrease in salt (sodium) levels in the body. Supplies needed: You will need an oral rehydration solution (ORS) if your health care provider tells you to use one. This is a drink to treat dehydration. It can be found in pharmacies and retail stores. How to rehydrate Fluids Follow instructions from your health care provider about what to drink. The kind of fluid and the amount you should drink depend on your condition. In general, you should choose drinks that you prefer. If told by your health care provider, drink an ORS. Make an  ORS by following instructions on the package. Start by drinking small amounts, about  cup (120 mL) every 5-10 minutes. Slowly increase how much you drink until you have taken in the amount recommended by your health care provider. Drink enough clear fluids to keep your urine pale yellow. If you were told to drink an ORS, finish it first, then start slowly drinking other clear fluids. Drink fluids such as: Water. This includes sparkling and flavored water. Drinking only water can lead to having too little sodium in your body (hyponatremia). Follow the advice of your health care provider. Water from ice chips you suck on. Fruit juice with water added to it (diluted). Sports drinks. Hot or cold herbal teas. Broth-based soups. Milk or milk products. Food Follow instructions from your health care provider about what to eat while you rehydrate. Your health care provider may recommend that you slowly begin eating regular foods in small amounts. Eat foods that contain a healthy balance of electrolytes, such as bananas, oranges, potatoes, tomatoes, and spinach. Avoid foods that are greasy or contain a lot of sugar. In some cases, you may get nutrition through a feeding tube that is passed through your nose and into your stomach (nasogastric tube, or NG tube). This may be done if you have uncontrolled vomiting or diarrhea. Drinks to avoid  Certain drinks may make dehydration worse. While you rehydrate, avoid drinking alcohol. How to tell if you are recovering from dehydration You may be getting better if: You are urinating more often than before you started rehydrating. Your urine is pale yellow. Your energy level improves. You vomit less  often. You have diarrhea less often. Your appetite improves or returns to normal. You feel less dizzy or light-headed. Your skin tone and color start to look more normal. Follow these instructions at home: Take over-the-counter and prescription medicines only  as told by your health care provider. Do not take sodium tablets. Doing this can lead to having too much sodium in your body (hypernatremia). Contact a health care provider if: You continue to have symptoms of mild or moderate dehydration, such as: Thirst. Dry lips. Slightly dry mouth. Dizziness. Dark urine or less urine than normal. Muscle cramps. You continue to vomit or have diarrhea. Get help right away if: You have symptoms of dehydration that get worse. You have a fever. You have a severe headache. You have been vomiting and have problems, such as: Your vomiting gets worse or does not go away. Your vomit includes blood or green matter (bile). You cannot eat or drink without vomiting. You have problems with urination or bowel movements, such as: Diarrhea that gets worse or does not go away. Blood in your stool (feces). This may cause stool to look black and tarry. Not urinating, or urinating only a small amount of very dark urine, within 6-8 hours. You have trouble breathing. You have symptoms that get worse with treatment. These symptoms may be an emergency. Get help right away. Call 911. Do not wait to see if the symptoms will go away. Do not drive yourself to the hospital. This information is not intended to replace advice given to you by your health care provider. Make sure you discuss any questions you have with your health care provider. Dexamethasone  Injection What is this medication? DEXAMETHASONE  (dex a METH a sone) treats many conditions such as asthma, allergic reactions, arthritis, inflammatory bowel diseases, adrenal, and blood or bone marrow disorders. It works by decreasing inflammation and slowing down an overactive immune system. It belongs to a group of medications called steroids. This medicine may be used for other purposes; ask your health care provider or pharmacist if you have questions. COMMON BRAND NAME(S): Decadron , DEX24, DoubleDex, ReadySharp  Dexamethasone , Simplist Dexamethasone , Solurex What should I tell my care team before I take this medication? They need to know if you have any of these conditions: Cushing syndrome Diabetes Glaucoma Heart attack Heart disease High blood pressure Infection, such as tuberculosis (TB), bacterial, fungal, or viral infections Kidney disease Liver disease Mental health condition Myasthenia gravis Osteoporosis Seizures Stomach or intestine problems Thyroid  disease An unusual or allergic reaction to dexamethasone , lactose, other medications, foods, dyes, or preservatives Pregnant or trying to get pregnant Breastfeeding How should I use this medication? This medication is injected into a muscle, joint, lesion, or other tissue. It is given by your care team in a hospital or clinic setting. Talk to your care team about the use of this medication in children. Special care may be needed. Overdosage: If you think you have taken too much of this medicine contact a poison control center or emergency room at once. NOTE: This medicine is only for you. Do not share this medicine with others. What if I miss a dose? This does not apply. What may interact with this medication? Do not take this medication with any of the following: Live virus vaccines This medication may also interact with the following: Aminoglutethimide Amphotericin B Aspirin and aspirin-like medications Certain antibiotics, such as erythromycin, clarithromycin, troleandomycin Certain antivirals for HIV or hepatitis Certain medications for seizures, such as carbamazepine, phenobarbital, phenytoin Certain medications to  treat myasthenia gravis Cholestyramine Cyclosporine Digoxin Diuretics Ephedrine Estrogen and progestin hormones Insulin or other medications for diabetes Isoniazid Ketoconazole Medications that relax muscles for surgery Mifepristone NSAIDs, medications for pain and inflammation, such as ibuprofen or  naproxen Rifampin Skin tests for allergies Thalidomide Vaccines Warfarin This list may not describe all possible interactions. Give your health care provider a list of all the medicines, herbs, non-prescription drugs, or dietary supplements you use. Also tell them if you smoke, drink alcohol, or use illegal drugs. Some items may interact with your medicine. What should I watch for while using this medication? Visit your care team for regular checks on your progress. Tell your care team if your symptoms do not start to get better or if they get worse. Your condition will be monitored carefully while you are receiving this medication. Wear a medical ID bracelet or chain. Carry a card that describes your condition. List the medications and doses you take on the card. This medication may increase your risk of getting an infection. Call your care team for advice if you get a fever, chills, sore throat, or other symptoms of a cold or flu. Do not treat yourself. Try to avoid being around people who are sick. If you have not had the measles or chickenpox vaccines, tell your care team right away if you are around someone with these viruses. If you are going to need surgery or other procedure, tell your care team that you are using this medication. You may need to be on a special diet while you are taking this medication. Ask your care team. Also, find out how many glasses of fluids you need to drink each day. This medication may increase blood sugar. The risk may be higher in patients who already have diabetes. Ask your care team what you can do to lower your risk of diabetes while taking this medication. What side effects may I notice from receiving this medication? Side effects that you should report to your care team as soon as possible: Allergic reactions--skin rash, itching, hives, swelling of the face, lips, tongue, or throat Cushing syndrome--increased fat around the midsection, upper back, neck, or  face, pink or purple stretch marks on the skin, thinning, fragile skin that easily bruises, unexpected hair growth High blood sugar (hyperglycemia)--increased thirst or amount of urine, unusual weakness or fatigue, blurry vision Increase in blood pressure Infection--fever, chills, cough, sore throat, wounds that don't heal, pain or trouble when passing urine, general feeling of discomfort or being unwell Low adrenal gland function--nausea, vomiting, loss of appetite, unusual weakness or fatigue, dizziness Mood and behavior changes--anxiety, nervousness, confusion, hallucinations, irritability, hostility, thoughts of suicide or self-harm, worsening mood, feelings of depression Stomach bleeding--bloody or black, tar-like stools, vomiting blood or brown material that looks like coffee grounds Swelling of the ankles, hands, or feet Side effects that usually do not require medical attention (report to your care team if they continue or are bothersome): Acne General discomfort and fatigue Headache Increase in appetite Nausea Trouble sleeping Weight gain This list may not describe all possible side effects. Call your doctor for medical advice about side effects. You may report side effects to FDA at 1-800-FDA-1088. Where should I keep my medication? This medication is given in a hospital or clinic. It will not be stored at home. NOTE: This sheet is a summary. It may not cover all possible information. If you have questions about this medicine, talk to your doctor, pharmacist, or health care provider.  2024 Elsevier/Gold  Standard (2021-11-12 00:00:00) Pegfilgrastim Injection What is this medication? PEGFILGRASTIM (PEG fil gra stim) lowers the risk of infection in people who are receiving chemotherapy. It works by Systems analyst make more white blood cells, which protects your body from infection. It may also be used to help people who have been exposed to high doses of radiation. This medicine  may be used for other purposes; ask your health care provider or pharmacist if you have questions. COMMON BRAND NAME(S): Fulphila, Fylnetra, Neulasta, Nyvepria, Stimufend, UDENYCA, UDENYCA ONBODY, Ziextenzo What should I tell my care team before I take this medication? They need to know if you have any of these conditions: Kidney disease Latex allergy Ongoing radiation therapy Sickle cell disease Skin reactions to acrylic adhesives (On-Body Injector only) An unusual or allergic reaction to pegfilgrastim, filgrastim, other medications, foods, dyes, or preservatives Pregnant or trying to get pregnant Breast-feeding How should I use this medication? This medication is for injection under the skin. If you get this medication at home, you will be taught how to prepare and give the pre-filled syringe or how to use the On-body Injector. Refer to the patient Instructions for Use for detailed instructions. Use exactly as directed. Tell your care team immediately if you suspect that the On-body Injector may not have performed as intended or if you suspect the use of the On-body Injector resulted in a missed or partial dose. It is important that you put your used needles and syringes in a special sharps container. Do not put them in a trash can. If you do not have a sharps container, call your pharmacist or care team to get one. Talk to your care team about the use of this medication in children. While this medication may be prescribed for selected conditions, precautions do apply. Overdosage: If you think you have taken too much of this medicine contact a poison control center or emergency room at once. NOTE: This medicine is only for you. Do not share this medicine with others. What if I miss a dose? It is important not to miss your dose. Call your care team if you miss your dose. If you miss a dose due to an On-body Injector failure or leakage, a new dose should be administered as soon as possible using a  single prefilled syringe for manual use. What may interact with this medication? Interactions have not been studied. This list may not describe all possible interactions. Give your health care provider a list of all the medicines, herbs, non-prescription drugs, or dietary supplements you use. Also tell them if you smoke, drink alcohol, or use illegal drugs. Some items may interact with your medicine. What should I watch for while using this medication? Your condition will be monitored carefully while you are receiving this medication. You may need blood work done while you are taking this medication. Talk to your care team about your risk of cancer. You may be more at risk for certain types of cancer if you take this medication. If you are going to need a MRI, CT scan, or other procedure, tell your care team that you are using this medication (On-Body Injector only). What side effects may I notice from receiving this medication? Side effects that you should report to your care team as soon as possible: Allergic reactions--skin rash, itching, hives, swelling of the face, lips, tongue, or throat Capillary leak syndrome--stomach or muscle pain, unusual weakness or fatigue, feeling faint or lightheaded, decrease in the amount of urine, swelling of  the ankles, hands, or feet, trouble breathing High white blood cell level--fever, fatigue, trouble breathing, night sweats, change in vision, weight loss Inflammation of the aorta--fever, fatigue, back, chest, or stomach pain, severe headache Kidney injury (glomerulonephritis)--decrease in the amount of urine, red or dark brown urine, foamy or bubbly urine, swelling of the ankles, hands, or feet Shortness of breath or trouble breathing Spleen injury--pain in upper left stomach or shoulder Unusual bruising or bleeding Side effects that usually do not require medical attention (report to your care team if they continue or are bothersome): Bone pain Pain in  the hands or feet This list may not describe all possible side effects. Call your doctor for medical advice about side effects. You may report side effects to FDA at 1-800-FDA-1088. Where should I keep my medication? Keep out of the reach of children. If you are using this medication at home, you will be instructed on how to store it. Throw away any unused medication after the expiration date on the label. NOTE: This sheet is a summary. It may not cover all possible information. If you have questions about this medicine, talk to your doctor, pharmacist, or health care provider.  2024 Elsevier/Gold Standard (2021-05-08 00:00:00) Document Revised: 10/19/2021 Document Reviewed: 10/19/2021 Elsevier Patient Education  2024 [employer].

## 2023-12-16 ENCOUNTER — Inpatient Hospital Stay

## 2023-12-19 ENCOUNTER — Encounter: Payer: Self-pay | Admitting: Oncology

## 2023-12-25 ENCOUNTER — Other Ambulatory Visit: Payer: Self-pay | Admitting: Oncology

## 2023-12-27 ENCOUNTER — Inpatient Hospital Stay

## 2023-12-27 ENCOUNTER — Inpatient Hospital Stay: Admitting: Oncology

## 2023-12-29 ENCOUNTER — Encounter

## 2024-01-02 ENCOUNTER — Inpatient Hospital Stay (HOSPITAL_BASED_OUTPATIENT_CLINIC_OR_DEPARTMENT_OTHER): Admitting: Nurse Practitioner

## 2024-01-02 ENCOUNTER — Inpatient Hospital Stay: Attending: Hematology and Oncology

## 2024-01-02 ENCOUNTER — Inpatient Hospital Stay

## 2024-01-02 ENCOUNTER — Other Ambulatory Visit: Payer: Self-pay | Admitting: Oncology

## 2024-01-02 VITALS — BP 106/62 | HR 76 | Temp 97.9°F | Resp 18 | Ht 65.0 in | Wt 117.4 lb

## 2024-01-02 DIAGNOSIS — R112 Nausea with vomiting, unspecified: Secondary | ICD-10-CM | POA: Insufficient documentation

## 2024-01-02 DIAGNOSIS — Z452 Encounter for adjustment and management of vascular access device: Secondary | ICD-10-CM | POA: Diagnosis not present

## 2024-01-02 DIAGNOSIS — C250 Malignant neoplasm of head of pancreas: Secondary | ICD-10-CM | POA: Insufficient documentation

## 2024-01-02 DIAGNOSIS — G620 Drug-induced polyneuropathy: Secondary | ICD-10-CM | POA: Diagnosis not present

## 2024-01-02 DIAGNOSIS — R197 Diarrhea, unspecified: Secondary | ICD-10-CM | POA: Insufficient documentation

## 2024-01-02 DIAGNOSIS — C259 Malignant neoplasm of pancreas, unspecified: Secondary | ICD-10-CM

## 2024-01-02 DIAGNOSIS — Z5189 Encounter for other specified aftercare: Secondary | ICD-10-CM | POA: Diagnosis not present

## 2024-01-02 DIAGNOSIS — Z5111 Encounter for antineoplastic chemotherapy: Secondary | ICD-10-CM | POA: Insufficient documentation

## 2024-01-02 DIAGNOSIS — C251 Malignant neoplasm of body of pancreas: Secondary | ICD-10-CM | POA: Insufficient documentation

## 2024-01-02 DIAGNOSIS — R109 Unspecified abdominal pain: Secondary | ICD-10-CM | POA: Insufficient documentation

## 2024-01-02 LAB — CBC WITH DIFFERENTIAL (CANCER CENTER ONLY)
Abs Immature Granulocytes: 0.06 K/uL (ref 0.00–0.07)
Basophils Absolute: 0.1 K/uL (ref 0.0–0.1)
Basophils Relative: 1 %
Eosinophils Absolute: 0.2 K/uL (ref 0.0–0.5)
Eosinophils Relative: 3 %
HCT: 36.1 % (ref 36.0–46.0)
Hemoglobin: 11.9 g/dL — ABNORMAL LOW (ref 12.0–15.0)
Immature Granulocytes: 1 %
Lymphocytes Relative: 28 %
Lymphs Abs: 2.4 K/uL (ref 0.7–4.0)
MCH: 33.1 pg (ref 26.0–34.0)
MCHC: 33 g/dL (ref 30.0–36.0)
MCV: 100.3 fL — ABNORMAL HIGH (ref 80.0–100.0)
Monocytes Absolute: 1.2 K/uL — ABNORMAL HIGH (ref 0.1–1.0)
Monocytes Relative: 13 %
Neutro Abs: 4.8 K/uL (ref 1.7–7.7)
Neutrophils Relative %: 54 %
Platelet Count: 484 K/uL — ABNORMAL HIGH (ref 150–400)
RBC: 3.6 MIL/uL — ABNORMAL LOW (ref 3.87–5.11)
RDW: 14.2 % (ref 11.5–15.5)
WBC Count: 8.8 K/uL (ref 4.0–10.5)
nRBC: 0 % (ref 0.0–0.2)

## 2024-01-02 LAB — CMP (CANCER CENTER ONLY)
ALT: 36 U/L (ref 0–44)
AST: 33 U/L (ref 15–41)
Albumin: 4.2 g/dL (ref 3.5–5.0)
Alkaline Phosphatase: 104 U/L (ref 38–126)
Anion gap: 11 (ref 5–15)
BUN: 12 mg/dL (ref 8–23)
CO2: 28 mmol/L (ref 22–32)
Calcium: 9.2 mg/dL (ref 8.9–10.3)
Chloride: 103 mmol/L (ref 98–111)
Creatinine: 0.67 mg/dL (ref 0.44–1.00)
GFR, Estimated: >60 mL/min (ref >60)
Glucose, Bld: 104 mg/dL — ABNORMAL HIGH (ref 70–99)
Potassium: 3.8 mmol/L (ref 3.5–5.1)
Sodium: 142 mmol/L (ref 135–145)
Total Bilirubin: 0.3 mg/dL (ref 0.0–1.2)
Total Protein: 6.7 g/dL (ref 6.5–8.1)

## 2024-01-02 NOTE — Progress Notes (Signed)
 Pt signed the [employer] consent. Declined to have port accessed until her treatment tomorrow. Agrees to phlebotomy if the provider wants additional labs (12/28/23 labs are in her chart in Care Everywhere). Provider notified.

## 2024-01-02 NOTE — Progress Notes (Signed)
 [facility] OFFICE PROGRESS NOTE   Diagnosis: Pancreas cancer  INTERVAL HISTORY:   Alyssa Harper completed a FOLFOX on 12/13/2023.  She did not have significant nausea or diarrhea following chemotherapy.  She took Decadron  prophylaxis.  She reports significant malaise for several days following chemotherapy.  She has noted orange discoloration of the tongue.  No symptom of an allergic reaction.  No peripheral numbness.  No bone pain following G-CSF.  Objective:  Vital signs in last 24 hours:  Blood pressure 106/62, pulse 76, temperature 97.9 F (36.6 C), temperature source Temporal, resp. rate 18, height 5' 5 (1.651 m), weight 117 lb 6.4 oz (53.3 kg), SpO2 96%.    HEENT: Mild yellow/orange coat over the ventral surface of the tongue, no buccal thrush or ulcer Resp: Lungs clear bilaterally Cardio: Rate and rhythm GI: Soft and nontender, no mass, no hepatosplenomegaly Vascular: No leg edema  Skin: Palms without erythema  Portacath/PICC-without erythema  Lab Results:  Lab Results  Component Value Date   WBC 8.8 01/02/2024   HGB 11.9 (L) 01/02/2024   HCT 36.1 01/02/2024   MCV 100.3 (H) 01/02/2024   PLT 484 (H) 01/02/2024   NEUTROABS 4.8 01/02/2024    CMP  Lab Results  Component Value Date   NA 142 01/02/2024   K 3.8 01/02/2024   CL 103 01/02/2024   CO2 28 01/02/2024   GLUCOSE 104 (H) 01/02/2024   BUN 12 01/02/2024   CREATININE 0.67 01/02/2024   CALCIUM  9.2 01/02/2024   PROT 6.7 01/02/2024   ALBUMIN 4.2 01/02/2024   AST 33 01/02/2024   ALT 36 01/02/2024   ALKPHOS 104 01/02/2024   BILITOT 0.3 01/02/2024   GFRNONAA >60 01/02/2024   GFRAA >60 09/13/2018    Lab Results  Component Value Date   RJW800 127 (H) 12/07/2023    Medications: I have reviewed the patient's current medications.   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-FNA of pancreas body lesion: Adenocarcinoma 07/22/2018 distal pancreatectomy and splenectomy,  adenocarcinoma with mucinous component, moderately differentiated, 0/16 nodes, negative margins,pT1pN0 Adjuvant FOLFIRINOX completed 02/22/2019 10/13/2023 CT Abdo/pelvis: Distended stomach, no evidence of recurrent tumor or distant metastatic disease 10/18/2023 EGD with stricture, status post a gastrojejunostomy 10/24/2023: Firm mass involving the head of the pancreas and duodenal bulb, serosal implant biopsy positive for adenocarcinoma-moderately differentiated, foundation 1-BRCA2 rearrangement, KRAS G 12V 11/08/2023 CTs at [facility]: Unchanged tumor in the anterior pancreas neck inseparable from the duodenal bulb, new hepatic segment 4 subcapsular lesion increased size and conspicuity of subcentimeter peripancreatic/anterior central mesenteric nodes 11/25/2023: Cycle 1 NALIRIFOX 12/14/2023: Cycle 2 NALIRIFOX, oxaliplatin  further dose reduced and irinotecan held, G-CSF added  2.   Admission 11/29/2023 with intractable nausea/vomiting 3.   Persistent nausea, diarrhea, and cramping abdominal pain following cycle 1 NALIRIFOX       Disposition: Mr has completed 2 cycles of salvage neoadjuvant chemotherapy.  Irinotecan was held with the last cycle of chemotherapy.  She tolerated cycle 2 with significantly less nausea and diarrhea.  Irinotecan will remain on hold.  She will return for cycle 3 chemotherapy tomorrow.  She will again receive Decadron  prophylaxis and G-CSF following chemotherapy.  I discussed the case with Dr. Waddell from [facility] last week.  He saw Ms. Laroque in consultation.  He recommends completing 2 more cycles of chemotherapy prior to restaging evaluation.  We can then consider further chemotherapy, chemotherapy/radiation, and surgical options.  I will communicate with Dr. Sheilda regarding the indication for resuming irinotecan.  Arley  Cloretta, MD  01/02/2024  2:39 PM

## 2024-01-03 ENCOUNTER — Inpatient Hospital Stay

## 2024-01-03 ENCOUNTER — Other Ambulatory Visit: Payer: Self-pay | Admitting: *Deleted

## 2024-01-03 VITALS — BP 93/69 | HR 73 | Temp 98.0°F | Resp 18 | Wt 117.0 lb

## 2024-01-03 DIAGNOSIS — Z95828 Presence of other vascular implants and grafts: Secondary | ICD-10-CM

## 2024-01-03 DIAGNOSIS — C259 Malignant neoplasm of pancreas, unspecified: Secondary | ICD-10-CM

## 2024-01-03 DIAGNOSIS — Z5111 Encounter for antineoplastic chemotherapy: Secondary | ICD-10-CM | POA: Diagnosis not present

## 2024-01-03 MED ORDER — PALONOSETRON HCL INJECTION 0.25 MG/5ML
0.2500 mg | Freq: Once | INTRAVENOUS | Status: AC
  Administered 2024-01-03: 0.25 mg via INTRAVENOUS
  Filled 2024-01-03: qty 5

## 2024-01-03 MED ORDER — DIPHENHYDRAMINE HCL 25 MG PO CAPS
25.0000 mg | ORAL_CAPSULE | Freq: Once | ORAL | Status: AC
  Administered 2024-01-03: 25 mg via ORAL
  Filled 2024-01-03: qty 1

## 2024-01-03 MED ORDER — SODIUM CHLORIDE 0.9 % IV SOLN
1200.0000 mg/m2 | INTRAVENOUS | Status: DC
  Administered 2024-01-03: 2000 mg via INTRAVENOUS
  Filled 2024-01-03: qty 40

## 2024-01-03 MED ORDER — DEXAMETHASONE 4 MG PO TABS
8.0000 mg | ORAL_TABLET | Freq: Once | ORAL | Status: AC
  Administered 2024-01-03: 8 mg via ORAL
  Filled 2024-01-03: qty 2

## 2024-01-03 MED ORDER — DEXTROSE 5 % IV SOLN: INTRAVENOUS | Status: AC

## 2024-01-03 MED ORDER — SODIUM CHLORIDE 0.9% FLUSH
10.0000 mL | INTRAVENOUS | Status: DC | PRN
Start: 2024-01-03 — End: 2024-01-03

## 2024-01-03 MED ORDER — OXALIPLATIN CHEMO INJECTION 100 MG/20ML
42.5000 mg/m2 | Freq: Once | INTRAVENOUS | Status: AC
  Administered 2024-01-03: 65 mg via INTRAVENOUS
  Filled 2024-01-03: qty 13

## 2024-01-03 MED ORDER — HEPARIN SOD (PORK) LOCK FLUSH 100 UNIT/ML IV SOLN: 500.0000 [IU] | Freq: Once | INTRAVENOUS | Status: DC | PRN

## 2024-01-03 MED ORDER — SODIUM CHLORIDE 0.9 % IV SOLN
150.0000 mg | Freq: Once | INTRAVENOUS | Status: AC
  Administered 2024-01-03: 150 mg via INTRAVENOUS
  Filled 2024-01-03: qty 150

## 2024-01-03 MED ORDER — FAMOTIDINE IN NACL 20-0.9 MG/50ML-% IV SOLN
20.0000 mg | Freq: Once | INTRAVENOUS | Status: AC
  Administered 2024-01-03: 20 mg via INTRAVENOUS
  Filled 2024-01-03: qty 50

## 2024-01-03 MED ORDER — SODIUM CHLORIDE 0.9 % IV SOLN: INTRAVENOUS | Status: DC

## 2024-01-03 MED ORDER — SODIUM CHLORIDE 0.9% FLUSH
10.0000 mL | Freq: Once | INTRAVENOUS | Status: AC
  Administered 2024-01-03: 10 mL via INTRAVENOUS

## 2024-01-03 NOTE — Patient Instructions (Signed)
 [facility] - A DEPT OF [facility]  Discharge Instructions: Thank you for choosing [facility] to provide your oncology and hematology care.   If you have a lab appointment with the Cancer Center, please go directly to the Cancer Center and check in at the registration area.   Wear comfortable clothing and clothing appropriate for easy access to any Portacath or PICC line.   We strive to give you quality time with your provider. You may need to reschedule your appointment if you arrive late (15 or more minutes).  Arriving late affects you and other patients whose appointments are after yours.  Also, if you miss three or more appointments without notifying the office, you may be dismissed from the clinic at the provider's discretion.      For prescription refill requests, have your pharmacy contact our office and allow 72 hours for refills to be completed.    Today you received the following chemotherapy and/or immunotherapy agents: oxaliplatin  and fluorouracil .     To help prevent nausea and vomiting after your treatment, we encourage you to take your nausea medication as directed.  BELOW ARE SYMPTOMS THAT SHOULD BE REPORTED IMMEDIATELY: *FEVER GREATER THAN 100.4 F (38 C) OR HIGHER *CHILLS OR SWEATING *NAUSEA AND VOMITING THAT IS NOT CONTROLLED WITH YOUR NAUSEA MEDICATION *UNUSUAL SHORTNESS OF BREATH *UNUSUAL BRUISING OR BLEEDING *URINARY PROBLEMS (pain or burning when urinating, or frequent urination) *BOWEL PROBLEMS (unusual diarrhea, constipation, pain near the anus) TENDERNESS IN MOUTH AND THROAT WITH OR WITHOUT PRESENCE OF ULCERS (sore throat, sores in mouth, or a toothache) UNUSUAL RASH, SWELLING OR PAIN  UNUSUAL VAGINAL DISCHARGE OR ITCHING   Items with * indicate a potential emergency and should be followed up as soon as possible or go to the Emergency Department if any problems should occur.  Please show the CHEMOTHERAPY ALERT  CARD or IMMUNOTHERAPY ALERT CARD at check-in to the Emergency Department and triage nurse.  Should you have questions after your visit or need to cancel or reschedule your appointment, please contact [facility] - A DEPT [facility]  Dept: [phone]  and follow the prompts.  Office hours are 8:00 a.m. to 4:30 p.m. Monday - Friday. Please note that voicemails left after 4:00 p.m. may not be returned until the following business day.  We are closed weekends and major holidays. You have access to a nurse at all times for urgent questions. Please call the main number to the clinic Dept: [phone] and follow the prompts.   For any non-urgent questions, you may also contact your provider using MyChart. We now offer e-Visits for anyone 64 and older to request care online for non-urgent symptoms. For details visit [email or web address].   Also download the MyChart app! Go to the app store, search MyChart, open the app, select [place], and log in with your MyChart username and password.  The chemotherapy medication bag should finish at 46 hours, 96 hours, or 7 days. For example, if your pump is scheduled for 46 hours and it was put on at 4:00 p.m., it should finish at 2:00 p.m. the day it is scheduled to come off regardless of your appointment time.     Estimated time to finish at .   If the display on your pump reads Low Volume and it is beeping, take the batteries out of the pump and come to the cancer center for it to be taken off.  If the pump alarms go off prior to the pump reading Low Volume then call [phone] and someone can assist you.  If the plunger comes out and the chemotherapy medication is leaking out, please use your home chemo spill kit to clean up the spill. Do NOT use paper towels or other household products.  If you have problems or questions regarding your pump, please call either [phone] (24 hours a day) or the cancer  center Monday-Friday 8:00 a.m.- 4:30 p.m. at the clinic number and we will assist you. If you are unable to get assistance, then go to the nearest Emergency Department and ask the staff to contact the IV team for assistance.

## 2024-01-04 LAB — CANCER ANTIGEN 19-9: CA 19-9: 46 U/mL — ABNORMAL HIGH (ref 0–35)

## 2024-01-04 NOTE — Progress Notes (Signed)
 Pt declined to let me tape the clamps on her port/pump tubing at discharge. I did tape the connection. She said she knows if the clamps are opened or not and will call [employer] if any concerns.

## 2024-01-05 ENCOUNTER — Inpatient Hospital Stay

## 2024-01-05 VITALS — BP 119/66 | HR 66 | Temp 98.2°F | Resp 18

## 2024-01-05 DIAGNOSIS — C259 Malignant neoplasm of pancreas, unspecified: Secondary | ICD-10-CM

## 2024-01-05 DIAGNOSIS — Z5111 Encounter for antineoplastic chemotherapy: Secondary | ICD-10-CM | POA: Diagnosis not present

## 2024-01-05 DIAGNOSIS — T451X5A Adverse effect of antineoplastic and immunosuppressive drugs, initial encounter: Secondary | ICD-10-CM

## 2024-01-05 MED ORDER — SODIUM CHLORIDE 0.9 % IV SOLN: INTRAVENOUS | Status: AC

## 2024-01-05 MED ORDER — SODIUM CHLORIDE 0.9% FLUSH
10.0000 mL | INTRAVENOUS | Status: DC | PRN
  Administered 2024-01-05: 10 mL

## 2024-01-05 MED ORDER — HEPARIN SOD (PORK) LOCK FLUSH 100 UNIT/ML IV SOLN
500.0000 [IU] | Freq: Once | INTRAVENOUS | Status: AC | PRN
  Administered 2024-01-05: 500 [IU]

## 2024-01-05 MED ORDER — PEGFILGRASTIM-JMDB 6 MG/0.6ML ~~LOC~~ SOSY
6.0000 mg | PREFILLED_SYRINGE | Freq: Once | SUBCUTANEOUS | Status: AC
  Administered 2024-01-05: 6 mg via SUBCUTANEOUS

## 2024-01-05 NOTE — Patient Instructions (Signed)

## 2024-01-08 ENCOUNTER — Encounter: Payer: Self-pay | Admitting: Oncology

## 2024-01-11 ENCOUNTER — Encounter: Payer: Self-pay | Admitting: *Deleted

## 2024-01-13 ENCOUNTER — Other Ambulatory Visit: Payer: Self-pay | Admitting: Oncology

## 2024-01-13 DIAGNOSIS — C259 Malignant neoplasm of pancreas, unspecified: Secondary | ICD-10-CM

## 2024-01-16 ENCOUNTER — Inpatient Hospital Stay: Admitting: Nutrition

## 2024-01-16 ENCOUNTER — Inpatient Hospital Stay (HOSPITAL_BASED_OUTPATIENT_CLINIC_OR_DEPARTMENT_OTHER): Admitting: Oncology

## 2024-01-16 ENCOUNTER — Other Ambulatory Visit: Payer: Self-pay | Admitting: *Deleted

## 2024-01-16 ENCOUNTER — Inpatient Hospital Stay

## 2024-01-16 ENCOUNTER — Other Ambulatory Visit

## 2024-01-16 VITALS — BP 99/61 | HR 71 | Temp 98.0°F | Resp 18 | Ht 65.0 in | Wt 119.7 lb

## 2024-01-16 DIAGNOSIS — Z5111 Encounter for antineoplastic chemotherapy: Secondary | ICD-10-CM | POA: Diagnosis not present

## 2024-01-16 DIAGNOSIS — C259 Malignant neoplasm of pancreas, unspecified: Secondary | ICD-10-CM

## 2024-01-16 LAB — CMP (CANCER CENTER ONLY)
ALT: 21 U/L (ref 0–44)
AST: 27 U/L (ref 15–41)
Albumin: 4 g/dL (ref 3.5–5.0)
Alkaline Phosphatase: 156 U/L — ABNORMAL HIGH (ref 38–126)
Anion gap: 10 (ref 5–15)
BUN: 11 mg/dL (ref 8–23)
CO2: 27 mmol/L (ref 22–32)
Calcium: 9 mg/dL (ref 8.9–10.3)
Chloride: 102 mmol/L (ref 98–111)
Creatinine: 0.71 mg/dL (ref 0.44–1.00)
GFR, Estimated: >60 mL/min (ref >60)
Glucose, Bld: 141 mg/dL — ABNORMAL HIGH (ref 70–99)
Potassium: 4.4 mmol/L (ref 3.5–5.1)
Sodium: 139 mmol/L (ref 135–145)
Total Bilirubin: <0.2 mg/dL (ref 0.0–1.2)
Total Protein: 6.6 g/dL (ref 6.5–8.1)

## 2024-01-16 LAB — CBC WITH DIFFERENTIAL (CANCER CENTER ONLY)
Abs Immature Granulocytes: 0.19 K/uL — ABNORMAL HIGH (ref 0.00–0.07)
Basophils Absolute: 0.1 K/uL (ref 0.0–0.1)
Basophils Relative: 1 %
Eosinophils Absolute: 0.2 K/uL (ref 0.0–0.5)
Eosinophils Relative: 1 %
HCT: 36.1 % (ref 36.0–46.0)
Hemoglobin: 11.9 g/dL — ABNORMAL LOW (ref 12.0–15.0)
Immature Granulocytes: 1 %
Lymphocytes Relative: 23 %
Lymphs Abs: 3.3 K/uL (ref 0.7–4.0)
MCH: 33.8 pg (ref 26.0–34.0)
MCHC: 33 g/dL (ref 30.0–36.0)
MCV: 102.6 fL — ABNORMAL HIGH (ref 80.0–100.0)
Monocytes Absolute: 1.4 K/uL — ABNORMAL HIGH (ref 0.1–1.0)
Monocytes Relative: 10 %
Neutro Abs: 9 K/uL — ABNORMAL HIGH (ref 1.7–7.7)
Neutrophils Relative %: 64 %
Platelet Count: 377 K/uL (ref 150–400)
RBC: 3.52 MIL/uL — ABNORMAL LOW (ref 3.87–5.11)
RDW: 15 % (ref 11.5–15.5)
WBC Count: 14.1 K/uL — ABNORMAL HIGH (ref 4.0–10.5)
nRBC: 0.4 % — ABNORMAL HIGH (ref 0.0–0.2)

## 2024-01-16 NOTE — Progress Notes (Signed)
 Nutrition follow up completed with patient and husband. Receives NALIRIFOX for recurrent pancreas cancer and is followed by Dr. Cloretta.  Weight improved to 119 pounds 11.2 oz July 28 from 113 pounds 1.6 oz June 23.  Labs include Glucose 141.  She feels well and has been very compliant with frequent meals/snacks. She works hard to gain weight and still would like to add a few more pounds. She struggles with food intake the week after her chemotherapy. She has occasional diarrhea. Reports stools are clay colored. Plans to ask MD about resuming her calcium  and Vitamin D. One physician told her it was ok to take a MVI. She continues Creon . No questions.  Despite telling them they do not want to talk about it, she and her husband continue to receive unwanted advice from friends and family. This is frustrating for both of them.  Nutrition Diagnosis: Food and Nutrition Related Knowledge Deficit improved  Intervention: Congratulated patient for her efforts on weight gain. Continue small frequent meals and snacks with high calorie, high protein foods. Choose healthy foods to provide maximum vitamins and minerals. Ask MD about resuming Calcium  and Vitamin D. Provided supportive listening.  Monitoring, Evaluation, Goals: Tolerate adequate calories and protein to promote increase in lean body mass.  Next Visit: To be scheduled as needed. Patient has RD contact information.

## 2024-01-16 NOTE — Progress Notes (Signed)
 [facility] OFFICE PROGRESS NOTE   Diagnosis: Pancreas cancer  INTERVAL HISTORY:   Alyssa Harper returns as scheduled.  She completed another cycle of chemotherapy on 01/03/2024.  She had nausea following chemotherapy, but this remained improved compared to the first few cycles.  No diarrhea.  She has mild numbness in the fingertips.  No tingling.  No numbness in the feet.  The numbness does not interfere with activity.  She is working.  She reports clay colored stool.  Objective:  Vital signs in last 24 hours:  Blood pressure 99/61, pulse 71, temperature 98 F (36.7 C), resp. rate 18, height 5' 5 (1.651 m), weight 119 lb 11.2 oz (54.3 kg), SpO2 100%.    HEENT: No thrush or ulcers Resp: Lungs clear bilaterally Cardio: Regular rate and rhythm GI: Nontender, no hepatomegaly, no mass Vascular: No leg edema Neuro: Mild loss of vibratory sense at the fingertips bilaterally   Portacath/PICC-without erythema  Lab Results:  Lab Results  Component Value Date   WBC 14.1 (H) 01/16/2024   HGB 11.9 (L) 01/16/2024   HCT 36.1 01/16/2024   MCV 102.6 (H) 01/16/2024   PLT 377 01/16/2024   NEUTROABS 9.0 (H) 01/16/2024    CMP  Lab Results  Component Value Date   NA 139 01/16/2024   K 4.4 01/16/2024   CL 102 01/16/2024   CO2 27 01/16/2024   GLUCOSE 141 (H) 01/16/2024   BUN 11 01/16/2024   CREATININE 0.71 01/16/2024   CALCIUM  9.0 01/16/2024   PROT 6.6 01/16/2024   ALBUMIN 4.0 01/16/2024   AST 27 01/16/2024   ALT 21 01/16/2024   ALKPHOS 156 (H) 01/16/2024   BILITOT <0.2 01/16/2024   GFRNONAA >60 01/16/2024   GFRAA >60 09/13/2018    Lab Results  Component Value Date   RJW800 46 (H) 01/02/2024     Medications: I have reviewed the patient's current medications.   Assessment/Plan:  Pancreas cancer diagnosed in January 2020-FNA of pancreas body lesion: Adenocarcinoma 07/22/2018 distal pancreatectomy and splenectomy, adenocarcinoma with mucinous component,  moderately differentiated, 0/16 nodes, negative margins,pT1pN0 Adjuvant FOLFIRINOX completed 02/22/2019 10/13/2023 CT Abdo/pelvis: Distended stomach, no evidence of recurrent tumor or distant metastatic disease 10/18/2023 EGD with stricture, status post a gastrojejunostomy 10/24/2023: Firm mass involving the head of the pancreas and duodenal bulb, serosal implant biopsy positive for adenocarcinoma-moderately differentiated, foundation 1-BRCA2 rearrangement, KRAS G 12V 11/08/2023 CTs at [facility]: Unchanged tumor in the anterior pancreas neck inseparable from the duodenal bulb, new hepatic segment 4 subcapsular lesion increased size and conspicuity of subcentimeter peripancreatic/anterior central mesenteric nodes 11/25/2023: Cycle 1 NALIRIFOX 12/13/2023: Cycle 2 NALIRIFOX, oxaliplatin  further dose reduced and irinotecan held, G-CSF added 01/03/2024: Cycle 3 NALIRIFOX, irinotecan held, G -CSF 01/17/2024: Cycle 4 NALIRIFOX, irinotecan held, G-CSF  2.   Admission 11/29/2023 with intractable nausea/vomiting 3.   Persistent nausea, diarrhea, and cramping abdominal pain following cycle 1 NALIRIFOX 4.   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 Spees has completed 3 cycles of salvage chemotherapy.  She tolerated the last 2 cycles with less toxicity after irinotecan was eliminated from the regimen.  She will complete cycle 4 tomorrow.  The CA 19-9 was lower on 01/02/2024.  We will repeat a CA 19-9 tomorrow.  She is scheduled for a restaging evaluation and follow-up visit at [facility] in 2 weeks.  She will then go to [facility] for a radiation oncology consult.  She will return  for an office visit here on 01/14/2024.  She is developing early oxaliplatin  neuropathy.  We will monitor for progressive neuropathy symptoms.  Germline molecular testing is pending.  Arley Hof, MD  01/16/2024  2:23 PM

## 2024-01-17 ENCOUNTER — Inpatient Hospital Stay

## 2024-01-17 ENCOUNTER — Ambulatory Visit

## 2024-01-17 VITALS — BP 101/60 | HR 76 | Temp 97.9°F | Resp 16

## 2024-01-17 DIAGNOSIS — C259 Malignant neoplasm of pancreas, unspecified: Secondary | ICD-10-CM

## 2024-01-17 DIAGNOSIS — Z5111 Encounter for antineoplastic chemotherapy: Secondary | ICD-10-CM | POA: Diagnosis not present

## 2024-01-17 DIAGNOSIS — Z95828 Presence of other vascular implants and grafts: Secondary | ICD-10-CM

## 2024-01-17 MED ORDER — DEXTROSE 5 % IV SOLN: INTRAVENOUS | Status: AC

## 2024-01-17 MED ORDER — DEXAMETHASONE 4 MG PO TABS
8.0000 mg | ORAL_TABLET | Freq: Once | ORAL | Status: AC
  Administered 2024-01-17: 8 mg via ORAL
  Filled 2024-01-17: qty 2

## 2024-01-17 MED ORDER — SODIUM CHLORIDE 0.9 % IV SOLN: INTRAVENOUS | Status: DC

## 2024-01-17 MED ORDER — PALONOSETRON HCL INJECTION 0.25 MG/5ML
0.2500 mg | Freq: Once | INTRAVENOUS | Status: AC
  Administered 2024-01-17: 0.25 mg via INTRAVENOUS
  Filled 2024-01-17: qty 5

## 2024-01-17 MED ORDER — DIPHENHYDRAMINE HCL 25 MG PO CAPS
25.0000 mg | ORAL_CAPSULE | Freq: Once | ORAL | Status: AC
  Administered 2024-01-17: 25 mg via ORAL
  Filled 2024-01-17: qty 1

## 2024-01-17 MED ORDER — SODIUM CHLORIDE 0.9% FLUSH
10.0000 mL | Freq: Once | INTRAVENOUS | Status: AC
  Administered 2024-01-17: 10 mL via INTRAVENOUS

## 2024-01-17 MED ORDER — SODIUM CHLORIDE 0.9 % IV SOLN
150.0000 mg | Freq: Once | INTRAVENOUS | Status: AC
  Administered 2024-01-17: 150 mg via INTRAVENOUS
  Filled 2024-01-17: qty 150

## 2024-01-17 MED ORDER — SODIUM CHLORIDE 0.9 % IV SOLN
1200.0000 mg/m2 | INTRAVENOUS | Status: DC
  Administered 2024-01-17: 2000 mg via INTRAVENOUS
  Filled 2024-01-17: qty 40

## 2024-01-17 MED ORDER — FAMOTIDINE IN NACL 20-0.9 MG/50ML-% IV SOLN
20.0000 mg | Freq: Once | INTRAVENOUS | Status: AC
  Administered 2024-01-17: 20 mg via INTRAVENOUS
  Filled 2024-01-17: qty 50

## 2024-01-17 MED ORDER — OXALIPLATIN CHEMO INJECTION 100 MG/20ML
42.5000 mg/m2 | Freq: Once | INTRAVENOUS | Status: AC
  Administered 2024-01-17: 65 mg via INTRAVENOUS
  Filled 2024-01-17: qty 13

## 2024-01-17 NOTE — Patient Instructions (Addendum)
 [facility] - A DEPT OF [facility]  Discharge Instructions: Thank you for choosing [facility] to provide your oncology and hematology care.   If you have a lab appointment with the Cancer Center, please go directly to the Cancer Center and check in at the registration area.   Wear comfortable clothing and clothing appropriate for easy access to any Portacath or PICC line.   We strive to give you quality time with your provider. You may need to reschedule your appointment if you arrive late (15 or more minutes).  Arriving late affects you and other patients whose appointments are after yours.  Also, if you miss three or more appointments without notifying the office, you may be dismissed from the clinic at the provider's discretion.      For prescription refill requests, have your pharmacy contact our office and allow 72 hours for refills to be completed.    Today you received the following chemotherapy and/or immunotherapy agents: oxaliplatin  and fluorouracil .     To help prevent nausea and vomiting after your treatment, we encourage you to take your nausea medication as directed.  BELOW ARE SYMPTOMS THAT SHOULD BE REPORTED IMMEDIATELY: *FEVER GREATER THAN 100.4 F (38 C) OR HIGHER *CHILLS OR SWEATING *NAUSEA AND VOMITING THAT IS NOT CONTROLLED WITH YOUR NAUSEA MEDICATION *UNUSUAL SHORTNESS OF BREATH *UNUSUAL BRUISING OR BLEEDING *URINARY PROBLEMS (pain or burning when urinating, or frequent urination) *BOWEL PROBLEMS (unusual diarrhea, constipation, pain near the anus) TENDERNESS IN MOUTH AND THROAT WITH OR WITHOUT PRESENCE OF ULCERS (sore throat, sores in mouth, or a toothache) UNUSUAL RASH, SWELLING OR PAIN  UNUSUAL VAGINAL DISCHARGE OR ITCHING   Items with * indicate a potential emergency and should be followed up as soon as possible or go to the Emergency Department if any problems should occur.  Please show the CHEMOTHERAPY ALERT  CARD or IMMUNOTHERAPY ALERT CARD at check-in to the Emergency Department and triage nurse.  Should you have questions after your visit or need to cancel or reschedule your appointment, please contact [facility] - A DEPT [facility]  Dept: [phone]  and follow the prompts.  Office hours are 8:00 a.m. to 4:30 p.m. Monday - Friday. Please note that voicemails left after 4:00 p.m. may not be returned until the following business day.  We are closed weekends and major holidays. You have access to a nurse at all times for urgent questions. Please call the main number to the clinic Dept: [phone] and follow the prompts.   For any non-urgent questions, you may also contact your provider using MyChart. We now offer e-Visits for anyone 42 and older to request care online for non-urgent symptoms. For details visit [email or web address].   Also download the MyChart app! Go to the app store, search MyChart, open the app, select [place], and log in with your MyChart username and password.  The chemotherapy medication bag should finish at 46 hours, 96 hours, or 7 days. For example, if your pump is scheduled for 46 hours and it was put on at 4:00 p.m., it should finish at 2:00 p.m. the day it is scheduled to come off regardless of your appointment time.     Estimated time to finish at 12pm on 01/19/24.   If the display on your pump reads Low Volume and it is beeping, take the batteries out of the pump and come to the cancer center for it to be  taken off.   If the pump alarms go off prior to the pump reading Low Volume then call [phone] and someone can assist you.  If the plunger comes out and the chemotherapy medication is leaking out, please use your home chemo spill kit to clean up the spill. Do NOT use paper towels or other household products.  If you have problems or questions regarding your pump, please call either [phone] (24 hours a day)  or the cancer center Monday-Friday 8:00 a.m.- 4:30 p.m. at the clinic number and we will assist you. If you are unable to get assistance, then go to the nearest Emergency Department and ask the staff to contact the IV team for assistance.

## 2024-01-18 ENCOUNTER — Telehealth: Payer: Self-pay | Admitting: *Deleted

## 2024-01-18 LAB — CANCER ANTIGEN 19-9: CA 19-9: 42 U/mL — ABNORMAL HIGH (ref 0–35)

## 2024-01-18 NOTE — Telephone Encounter (Signed)
 Mrs. Converse sent MyChart messages with concerns/worries she has: Worried about the increase in her alk phosphatase (156) Clay colored stools OK for dental cleaning next week?

## 2024-01-19 ENCOUNTER — Encounter

## 2024-01-19 ENCOUNTER — Inpatient Hospital Stay

## 2024-01-19 VITALS — BP 117/66 | HR 65 | Temp 98.1°F | Resp 18

## 2024-01-19 DIAGNOSIS — Z5111 Encounter for antineoplastic chemotherapy: Secondary | ICD-10-CM | POA: Diagnosis not present

## 2024-01-19 DIAGNOSIS — C259 Malignant neoplasm of pancreas, unspecified: Secondary | ICD-10-CM

## 2024-01-19 MED ORDER — SODIUM CHLORIDE 0.9 % IV SOLN: INTRAVENOUS | Status: DC

## 2024-01-19 MED ORDER — PEGFILGRASTIM-JMDB 6 MG/0.6ML ~~LOC~~ SOSY
6.0000 mg | PREFILLED_SYRINGE | Freq: Once | SUBCUTANEOUS | Status: AC
  Administered 2024-01-19: 6 mg via SUBCUTANEOUS
  Filled 2024-01-19: qty 0.6

## 2024-01-19 MED ORDER — HEPARIN SOD (PORK) LOCK FLUSH 100 UNIT/ML IV SOLN
500.0000 [IU] | Freq: Once | INTRAVENOUS | Status: AC | PRN
Start: 2024-01-19 — End: 2024-01-19
  Administered 2024-01-19: 500 [IU]

## 2024-01-19 MED ORDER — SODIUM CHLORIDE 0.9% FLUSH
10.0000 mL | INTRAVENOUS | Status: DC | PRN
  Administered 2024-01-19: 10 mL

## 2024-01-19 NOTE — Patient Instructions (Signed)

## 2024-01-24 ENCOUNTER — Encounter: Payer: Self-pay | Admitting: Genetic Counselor

## 2024-01-24 DIAGNOSIS — Z1379 Encounter for other screening for genetic and chromosomal anomalies: Secondary | ICD-10-CM | POA: Insufficient documentation

## 2024-01-28 NOTE — Progress Notes (Signed)
 Pancreatic Surgery ([facility]) Clinic New Patient H&P  Patient Name: Alyssa Harper, Alyssa Harper MRN: [id number] Date: February 07, 2024  Referring Physician: Dr. Richard Tisovec   Reason for Clinic Visit: Pancreatic Adenocarcinoma s/p Distal Pancreatectomy + Splenectomy (2020)  History of Present Illness: Alyssa Harper is a 63 y.o. female who presents for surgical evaluation of pancreatic adenocarcinoma. Her PMH is significant for distal pancreatectomy and splenectomy (07/22/2018, Dr. Jarnagin, [facility]), 6 cycles FOLFIRINOX (2020), open gastrojejunostomy (May 2025, Dr. Dasie, [facility]) and 4 cycles NALIFIROX (June-July 2025).  Briefly, Alyssa Harper presented in January 2020 with midepigastric pain radiating to the retrosternum, intractable nausea/vomiting and multiple episodes of loose watery diarrhea. CT C/A/P 07/03/18 demonstrated a 6.0x2.5x3.4cm hypoattenuating mass of pancreatic body with dilatation of pancreatic duct. 1.2cm hyperenahcning lesion of left lobe of liver; CA 19-9 was 19 and lipase was 4,125. MRCP 07/05/23 demonstrated a 20x74mm ovoid T2-hyperintense lesion in mid pancreas, with 6mm pancreatic duct dilatation, 8mm enhancing lesion of subcapsular left lateral hepatic lobe favored benign, and 21mm nonenhancing cyst of left hepatic lobe. EUS with biopsy 07/05/18 biopsied a 15x101mm mass and pathology was significant for pancreatic adenocarcinoma.   On 07/22/18 she underwent distal pancreatectomy and splenectomy with Dr. Jarnagin at [facility]; pathology was significant for 1.8 cm adenocarcinoma with < 80% mucinous component, +PNI, - LVI with 0/16 positive nodes and suggestion of pre-existing IPMN. She completed adjuvant FOLFIRINOX at [facility] through September 2020 and then underwent routine surveillance imaging. In April 2025, she presented to [facility] with PO intolerance, nausea and abdominal pain and was found to have gastric outlet obstruction with duodenal stenosis via EGD. She underwent a palliative  open gastrojejunostomy on 10/24/23 with Dr. Maude Dasie [facility]) which demonstrated a firm mass involving the pancreas and duodenal bulb, with biopsy significant for moderately-differentiated adenocarcinoma. Additional CT scans at [facility] in May 2025 demonstrated that the tumor was in the anterior pancreas neck and was inseparable from the duodenal bulb, as well as a new hepatic subscapular lesion that had increased in size. She completed 4 cycles of NALIFIROX from 11/25/23-01/17/24.   Today, she reports feeling overall well. She reports she hs been gaining weight, and tolerating her usual diet. She denies any recent fevers, chills, nausea, vomiting, or diarrhea.   Past Medical History: Hyperlipidemia Osteoporosis Pancreatitis Sjogren's syndrome Mitral valve prolapse    Past Surgical History: Distal pancreatectomy and splenectomy (07/22/18, Dr. Jarnagin, [facility]) Gastrojejunostomy (10/24/23, Dr. Dasie, [facility]) Augmentation bilateral mammaplasty (2013)  Medications:  Creon   Promethazine  25mg   Zolpidem  5mg     Allergies: None   Family History: Maternal grandmother: endometrial cancer, uterine cancer Maternal grandfather: heart disease, pancreatic cancer Paternal grandfather: heart disease  Social History:  Smoking: Never smoker.  EtOH: None.  Recreational substances: Denies. Lives with husband in [place], [place]. Works as a Education administrator.   Review of Systems: Negative other than stated in HPI.    Objective  Vitals: BP 106/72    HR 72   BMI: 19.4    Physical Exam:  General: Patient sitting comfortably in the clinic room. No jaundice and appears to be in no acute distress. CV: Regular rate and rhythm. No murmurs, rubs, or gallops. No cyanosis, clubbing, or edema. Pulm/Chest:  Symmetric chest expansion. Normal work of breathing on room air, no use of accessory muscles. Abdomen: Soft, nondistended, nontender to palpation. No masses or organomegaly appreciated on exam. Extremities:  Warm and well-perfused. Moving all extremities against gravity.    Labs: BMP (12/28/23): K 4.8, Cr 0.65, eGFR  99 CBC (12/28/23): WBC 13.4, Hgb 12.9, Plt 378 LFTs (12/28/23): AST/ALT 26/26, Alk phos 150, total bilirubin <0.3 CEA (12/28/23): 1.3 CA 19-9 (12/28/23): 64  Imaging: CT Abdomen 01/23/24 There has been interval decrease in size of the ill-defined mass (biopsy  proven adenocarcinoma at outside hospital) between the anterior aspect of  the pancreatic head/neck and posterior duodenal bulb now measuring 1.4 x  0.6 cm (5/51), previously 2.3 x 1.6 cm.   A geographic hypodensity posterior within segment IVb noted on that exam  is not visualized.   CT Angio Pancreas 11/08/23 1.  Since October 13, 2023, unchanged recurrent tumor in anterior aspect of pancreatic neck, inseparable from duodenal bulb. No major artery of venous abutment.  2.  New hepatic segment 4 subcapsular lesion, increased size and conspicuity of subcentimeter peripancreatic/anterior central mesenteric nodes, and anterior abdominal peritoneal stranding, probably postsurgical related to interval gastrojejunostomy. Attention on follow-up.   CT Abdomen 11/08/23 *  23 mm hypodense mass involving the anterior aspect of the pancreatic head, likely invading the duodenum, which could reflect recurrence or a new primary.  *  8 mm subcapsular hypodensity in hepatic segment 4, possibly postsurgical, warranting attention on follow-up.   Assessment & Plan  Alyssa Harper is a 63 y.o. female with PMH significant for distal pancreatectomy and splenectomy (07/22/2018, Dr. Jarnagin, [facility]), 6 cycles FOLFIRINOX (2020), open gastrojejunostomy (May 2025, Dr. Dasie, [facility]) and 4 cycles NALIFIROX (June-July 2025) who presents for surgical evaluation of pancreatic adenocarcinoma.   During today's visit, we discussed the patient's oncologic history to date, including her recent CT A/P demonstrating her known pancreatic cancer at the pancreatic neck.  We reviewed  the imaging and work-up thus far with our multidisciplinary team, including Dr. Phebe from radiation oncology and Dr. Dasie from medical oncology who were both in attendance in the clinic. Our recommendation would be to continue her current chemotherapy regimen, followed by a short chemoradiation course.   We reviewed that this would then be followed by surgery. We discussed the Whipple procedure, the risks, and possible complications. We reviewed what he expected post-operative course would be. Additionally, we discussed how following is surgery, she would have no pancreas. We discussed the implications of this, and how she would need to be managing her insulin and diabetes.    The patient expressed an understanding of the information above and asked many excellent questions. They were very eager to move forward with treatment with us . We will continue this plan, and plan to have her follow up with us  closer to the date of surgery to obtain consent.   Alyssa L. Marilyn, MD General Surgery, PGY-5 [phone]  Attending Note I have seen and examined Alyssa Harper together with Dr. Marilyn, personally reviewed her imaging, and discussed her case in the multidisciplinary GI cancer clinic.  I  agree with the above findings, assessment, and note.    Eric Jesslyn Furnace, MD

## 2024-01-30 ENCOUNTER — Encounter

## 2024-02-07 NOTE — Progress Notes (Signed)
 [facility] GI ONCOLOGY   Identification: This is a 63 y.o. 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 William Jarnagin - [facility], 2020), s/p adjuvant FOLFIRINOX (Dr. Sheilda, [facility]) who now presents with locally recurrent disease, seen now in consultation at the request of [place], Ricardo RAMAN, MD for recommendations regarding further therapy.   History of Present Illness :  This is a 63 y.o.female who initially presented with the history below:   Oncology History :     Oncology History Overview Note   Presented mid January in [place]  with 10/10 midepigastric pain radiating to retrosternum, intractable nausea and vomiting, and multiple episodes of loose watery diarrhea.    07/03/18 - CT CAP - 6.0x2.5x3.4cm hypoattenuating mass of pancreatic body with dilatation of pancreatic duct. 1.2cm hyperenahcning lesion of left lobe of liver.  07/03/18 - CA 19-9 - 19   07/04/18 - MRCP - 20x47mm ovoid T2-hyperintense lesion in mid pancreas, with 6mm pancreatic duct dilatation. 8mm enhancing lesion of subcapsular left lateral hepatic lobe favored benign, and 21mm nonenhancing cyst of left hepatic lobe.    07/05/18 - EUS - 15x15mm mass of pancreatic body biopsied, no obvious vascular involvement. No significant pathology in left lobe of liver or CBD.  Pathology = adenocarcinoma   Met with Dr. Almarie Dais of [facility] on 07/07/18, discussed surgery followed by adjuvant chemotherapy; did not have high risk features and therefore did not recommend neoadjuvant therapy at this time. She presents to [facility] for second opinion on management.     Adenocarcinoma of pancreas   07/10/2018 Initial Diagnosis     Adenocarcinoma of pancreas         s/p distal pancreatectomy and spleneectomy (pT1N0, 0/16 + LN Dr. William Jarnagin - [facility], 2020) s/p adjuvant FOLFIRINOX (Dr. 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 Maude Dawn, [facility]) Per outside path   Adenocarcinoma, moderately differentiated, involving fibroadipose tissue, morphologically compatible with the known pancreatic primary.   INTERVAL HISTORY NALIRIFOX DR x 2, poorly tolerated, n/v, diarrhea FOLFOX DR x 2 better tolerated Restaging CA19-9 improved  Tired from treatment      Review of Symptoms : ROS   Past Medical History : Problem List     Patient Active Problem List  Diagnosis  . Adenocarcinoma of pancreas          Past Medical History:  Diagnosis Date  . Adenocarcinoma of pancreas    . IBS (irritable bowel syndrome)    . Sjogren's disease               Past Surgical History:  Procedure Laterality Date  . AUGMENTATION MAMMAPLASTY Bilateral 2013          Medications :     Current Outpatient Medications Ordered in Epic  Medication Sig  . pancrelipase , lipase-protease-amylase, (CREON ) 24,000-76,000 -120,000 unit CpDR Take two with meals and one with snacks  . promethazine  (PHENERGAN ) 25 MG tablet Take 25 mg by mouth.  . zolpidem  (AMBIEN ) 5 MG tablet Take 5 mg by mouth.      Allergies :   No Known Allergies   Social History : Social History           Socioeconomic History  . Marital status: Married/Civil Publishing copy      Spouse name: Not on file  . Number of children: Not  on file  . Years of education: Not on file  . Highest education level: Not on file  Occupational History  . Not on file  Tobacco Use  . Smoking status: Never  . Smokeless tobacco: Never  Substance and Sexual Activity  . Alcohol use: Not on file  . Drug use: Not on file  . Sexual activity: Not on file  Other Topics Concern  . Not on file  Social History Narrative  . Not on file    Social Drivers of Health        Residential Stability: Low Risk  (11/08/2023)    Received from [facility]    Housing Stability Vital Sign    .  Unable to Pay for Housing in the Last Year: No    . Number of Times Moved in the Last Year: 0    . Homeless in the Last Year: No      [address] [place] [address]   Family History :      Family History  Problem Relation Age of Onset  . Pancreatic cancer Unspecified    . Melanoma Father    . Heart disease Father    . Endometrial cancer Maternal Grandmother    . Pancreatic cancer Maternal Grandfather    . Heart disease Maternal Grandfather    . Heart disease Paternal Grandfather            Exam : Well NAD Nrml inspir effort alert   Labs/Imaging :personallhy reviewed      Radiology: 10/21/2023 CT A/P Pancreas: Tail pancreatectomy. No ductal dilatation in the remnant pancreas. Underdistended gastric pylorus abutting the pancreatic neck limits evaluation these region however the appearance is similar to January 2020 preoperative MRI. No obvious mass lesion seen.    11/08/2023 CT Angio Pancreas ([facility]) 1.  Since October 13, 2023, unchanged recurrent tumor in anterior aspect of pancreatic neck, inseparable from duodenal bulb. No major artery of venous abutment.  2.  New hepatic segment 4 subcapsular lesion, increased size and conspicuity of subcentimeter peripancreatic/anterior central mesenteric nodes, and anterior abdominal peritoneal stranding, probably postsurgical related to interval gastrojejunostomy. Attention on follow-up.      Pathology: 10/24/2023 Peripancreatic nodule, biopsy: Adenocarcinoma, moderately differentiated, involving fibroadipose tissue, morphologically compatible with the known pancreatic primary.   Assessment and Plan of Care : This is a 62 y.o. female with a new vs recurrent pancreatic cancer seen now in consultation at the request of [place], Ricardo RAMAN, MD for recommendations regarding further therapy.   The patient has a history of pancreatic adenocarcinoma of the pancreatic tail, treated in 2020 with upfront surgery (distal pancreatectomy and  spleneectomy (pT1N0, 0/16 + LN Dr. William Jarnagin - [facility], 2020), s/p adjuvant FOLFIRNIOX (Dr. Sheilda, [facility]), who now presents with N/V in March [id number], found to have duodenal stenosis in with an associated pancreatic head / duodenal mass  Marshal Dawn MD, [facility], gastrojejunostomy). Biopsy of a serosal implant showed adenocarcinoma.    The patient presents for a second opinion regarding management. Her case was reviewed and discussed in multidisciplinary conference, overall the lesion anatomically appears most consistent with a new primary cancer. We agree with neoadjuvant FOLFIRINOX with role/rationale toward treatment of microscopic disease and improved chance of cure. We appreciate considering prior side effects and toxicities and that this is retreatment that empiric dose reductions are warranted.  If not yet completed, DPD and UGT1A1 testing may also be helpful to guide dosing. I've not used NALIRIFOX and so  I can't speak from experience but without known benefit and increased risk of GI toxicities, I wouldn't necessarily favor this going forward.  I answered questions about NGS and clinical trial participation. Ms. Potenza is considering chemoradiation, I explained the role of capecitabine as radiation sensitizing chemotherapy. In case of treatment Ms. Marter was consented for capecitabine today.   Seen today in followup, Ms. Ballman is s/p NALIRIFOX x 2 poorly tolerated and more recently FOLFOX with DR X 2.  We reviewed restaging in our multidisciplinary conference overall with tumor and markers improved.  We agree with plan for continued 4 cycles of FOLFOX and discussed recommendations for chemoradiation prior to surgery. Will plan to see after next set of scans for chemoradiation planning.    JINNY Dawn MD GI Oncology

## 2024-02-09 NOTE — Telephone Encounter (Signed)
 Patient Navigation    Flowsheet Row Telephone [facility]  Visit Information   Purpose of Encounter Support Shared Decision Making  Encounter Conducted With Patient  Patient Journey Phase Treatment  Clinical Review   Initial Patient Intake   Barriers to Care and Social Determinants of Health Assessment   Time Navigated Today (Min) 5  Complexity of Care/Duration of Encounter Low: Up to 10 minutes  Total Time Navigated (Min) 150    Reached out to patient for update on image sharing. Per our last discussion on 8/12, plan was to mail the disc to us . Today patient stated that her local team powershared the images. Alyssa Harper states she still has the disc, and can still mail it if needed. Team is aware.

## 2024-02-14 ENCOUNTER — Inpatient Hospital Stay

## 2024-02-14 ENCOUNTER — Inpatient Hospital Stay (HOSPITAL_BASED_OUTPATIENT_CLINIC_OR_DEPARTMENT_OTHER): Admitting: Oncology

## 2024-02-14 ENCOUNTER — Inpatient Hospital Stay: Attending: Hematology and Oncology

## 2024-02-14 VITALS — BP 101/71 | HR 78 | Temp 97.8°F | Resp 18 | Ht 65.0 in | Wt 119.7 lb

## 2024-02-14 VITALS — BP 107/59 | HR 61 | Temp 98.1°F | Resp 18

## 2024-02-14 DIAGNOSIS — Z5111 Encounter for antineoplastic chemotherapy: Secondary | ICD-10-CM | POA: Diagnosis present

## 2024-02-14 DIAGNOSIS — Z5189 Encounter for other specified aftercare: Secondary | ICD-10-CM | POA: Insufficient documentation

## 2024-02-14 DIAGNOSIS — C259 Malignant neoplasm of pancreas, unspecified: Secondary | ICD-10-CM

## 2024-02-14 DIAGNOSIS — C250 Malignant neoplasm of head of pancreas: Secondary | ICD-10-CM | POA: Insufficient documentation

## 2024-02-14 DIAGNOSIS — G620 Drug-induced polyneuropathy: Secondary | ICD-10-CM | POA: Insufficient documentation

## 2024-02-14 LAB — CBC WITH DIFFERENTIAL (CANCER CENTER ONLY)
Abs Immature Granulocytes: 0.01 K/uL (ref 0.00–0.07)
Basophils Absolute: 0.1 K/uL (ref 0.0–0.1)
Basophils Relative: 1 %
Eosinophils Absolute: 0.3 K/uL (ref 0.0–0.5)
Eosinophils Relative: 4 %
HCT: 35.9 % — ABNORMAL LOW (ref 36.0–46.0)
Hemoglobin: 12 g/dL (ref 12.0–15.0)
Immature Granulocytes: 0 %
Lymphocytes Relative: 30 %
Lymphs Abs: 2 K/uL (ref 0.7–4.0)
MCH: 34 pg (ref 26.0–34.0)
MCHC: 33.4 g/dL (ref 30.0–36.0)
MCV: 101.7 fL — ABNORMAL HIGH (ref 80.0–100.0)
Monocytes Absolute: 0.9 K/uL (ref 0.1–1.0)
Monocytes Relative: 14 %
Neutro Abs: 3.5 K/uL (ref 1.7–7.7)
Neutrophils Relative %: 51 %
Platelet Count: 464 K/uL — ABNORMAL HIGH (ref 150–400)
RBC: 3.53 MIL/uL — ABNORMAL LOW (ref 3.87–5.11)
RDW: 14.4 % (ref 11.5–15.5)
WBC Count: 6.7 K/uL (ref 4.0–10.5)
nRBC: 0 % (ref 0.0–0.2)

## 2024-02-14 LAB — CMP (CANCER CENTER ONLY)
ALT: 15 U/L (ref 0–44)
AST: 22 U/L (ref 15–41)
Albumin: 4.1 g/dL (ref 3.5–5.0)
Alkaline Phosphatase: 116 U/L (ref 38–126)
Anion gap: 11 (ref 5–15)
BUN: 9 mg/dL (ref 8–23)
CO2: 25 mmol/L (ref 22–32)
Calcium: 9.3 mg/dL (ref 8.9–10.3)
Chloride: 102 mmol/L (ref 98–111)
Creatinine: 0.61 mg/dL (ref 0.44–1.00)
GFR, Estimated: >60 mL/min (ref >60)
Glucose, Bld: 138 mg/dL — ABNORMAL HIGH (ref 70–99)
Potassium: 4 mmol/L (ref 3.5–5.1)
Sodium: 138 mmol/L (ref 135–145)
Total Bilirubin: 0.3 mg/dL (ref 0.0–1.2)
Total Protein: 6.4 g/dL — ABNORMAL LOW (ref 6.5–8.1)

## 2024-02-14 MED ORDER — OXALIPLATIN CHEMO INJECTION 100 MG/20ML
42.5000 mg/m2 | Freq: Once | INTRAVENOUS | Status: AC
  Administered 2024-02-14: 65 mg via INTRAVENOUS
  Filled 2024-02-14: qty 13

## 2024-02-14 MED ORDER — SODIUM CHLORIDE 0.9 % IV SOLN
150.0000 mg | Freq: Once | INTRAVENOUS | Status: AC
  Administered 2024-02-14: 150 mg via INTRAVENOUS
  Filled 2024-02-14: qty 150

## 2024-02-14 MED ORDER — SODIUM CHLORIDE 0.9 % IV SOLN: INTRAVENOUS | Status: DC

## 2024-02-14 MED ORDER — DEXTROSE 5 % IV SOLN: INTRAVENOUS | Status: AC

## 2024-02-14 MED ORDER — DEXAMETHASONE 4 MG PO TABS
8.0000 mg | ORAL_TABLET | Freq: Once | ORAL | Status: AC
  Administered 2024-02-14: 8 mg via ORAL
  Filled 2024-02-14: qty 2

## 2024-02-14 MED ORDER — FAMOTIDINE IN NACL 20-0.9 MG/50ML-% IV SOLN
20.0000 mg | Freq: Once | INTRAVENOUS | Status: AC
  Administered 2024-02-14: 20 mg via INTRAVENOUS
  Filled 2024-02-14: qty 50

## 2024-02-14 MED ORDER — DIPHENHYDRAMINE HCL 25 MG PO CAPS
25.0000 mg | ORAL_CAPSULE | Freq: Once | ORAL | Status: AC
  Administered 2024-02-14: 25 mg via ORAL
  Filled 2024-02-14: qty 1

## 2024-02-14 MED ORDER — PALONOSETRON HCL INJECTION 0.25 MG/5ML
0.2500 mg | Freq: Once | INTRAVENOUS | Status: AC
  Administered 2024-02-14: 0.25 mg via INTRAVENOUS
  Filled 2024-02-14: qty 5

## 2024-02-14 MED ORDER — SODIUM CHLORIDE 0.9 % IV SOLN
1200.0000 mg/m2 | INTRAVENOUS | Status: DC
  Administered 2024-02-14: 2000 mg via INTRAVENOUS
  Filled 2024-02-14: qty 40

## 2024-02-14 NOTE — Progress Notes (Signed)
 Patient seen by Dr. Arley Hof today  Vitals are within treatment parameters:Yes   Labs are within treatment parameters: Yes   Treatment plan has been signed: Yes   Per physician team, Patient is ready for treatment and there are NO modifications to the treatment plan.  (No irinotecan )

## 2024-02-14 NOTE — Patient Instructions (Signed)
 [facility] - A DEPT OF [facility]  Discharge Instructions: Thank you for choosing [facility] to provide your oncology and hematology care.   If you have a lab appointment with the Cancer Center, please go directly to the Cancer Center and check in at the registration area.   Wear comfortable clothing and clothing appropriate for easy access to any Portacath or PICC line.   We strive to give you quality time with your provider. You may need to reschedule your appointment if you arrive late (15 or more minutes).  Arriving late affects you and other patients whose appointments are after yours.  Also, if you miss three or more appointments without notifying the office, you may be dismissed from the clinic at the provider's discretion.      For prescription refill requests, have your pharmacy contact our office and allow 72 hours for refills to be completed.    Today you received the following chemotherapy and/or immunotherapy agents: oxaliplatin  and fluorouracil        To help prevent nausea and vomiting after your treatment, we encourage you to take your nausea medication as directed.  BELOW ARE SYMPTOMS THAT SHOULD BE REPORTED IMMEDIATELY: *FEVER GREATER THAN 100.4 F (38 C) OR HIGHER *CHILLS OR SWEATING *NAUSEA AND VOMITING THAT IS NOT CONTROLLED WITH YOUR NAUSEA MEDICATION *UNUSUAL SHORTNESS OF BREATH *UNUSUAL BRUISING OR BLEEDING *URINARY PROBLEMS (pain or burning when urinating, or frequent urination) *BOWEL PROBLEMS (unusual diarrhea, constipation, pain near the anus) TENDERNESS IN MOUTH AND THROAT WITH OR WITHOUT PRESENCE OF ULCERS (sore throat, sores in mouth, or a toothache) UNUSUAL RASH, SWELLING OR PAIN  UNUSUAL VAGINAL DISCHARGE OR ITCHING   Items with * indicate a potential emergency and should be followed up as soon as possible or go to the Emergency Department if any problems should occur.  Please show the CHEMOTHERAPY ALERT  CARD or IMMUNOTHERAPY ALERT CARD at check-in to the Emergency Department and triage nurse.  Should you have questions after your visit or need to cancel or reschedule your appointment, please contact [facility] - A DEPT OF [facility]  Dept: [phone]  and follow the prompts.  Office hours are 8:00 a.m. to 4:30 p.m. Monday - Friday. Please note that voicemails left after 4:00 p.m. may not be returned until the following business day.  We are closed weekends and major holidays. You have access to a nurse at all times for urgent questions. Please call the main number to the clinic Dept: [phone] and follow the prompts.   For any non-urgent questions, you may also contact your provider using MyChart. We now offer e-Visits for anyone 61 and older to request care online for non-urgent symptoms. For details visit [email or web address].   Also download the MyChart app! Go to the app store, search MyChart, open the app, select [place], and log in with your MyChart username and password.

## 2024-02-14 NOTE — Progress Notes (Signed)
 [facility] OFFICE PROGRESS NOTE   Diagnosis: Pancreas cancer  INTERVAL HISTORY:   Alyssa Harper completed another cycle of chemotherapy on 01/17/2024.  She did not have significant nausea following chemotherapy.  She has mild tingling in the left hand.  This does not interfere with activity.  She is working.  She feels full after eating.  She feels better after a bowel movement.  No mouth sores or diarrhea. She was seen by radiation oncology and medical oncology in [place] last week.  A restaging [facility] at [facility] 01/27/2024 revealed an interval decrease in the size of ill-defined mass between the pancreas head/neck and duodenal bulb.  4 additional cycles of chemotherapy are recommended prior to capecitabine/radiation.  She will undergo restaging evaluation after radiation and then proceed with surgery.    Objective:  Vital signs in last 24 hours:  Blood pressure 101/71, pulse 78, temperature 97.8 F (36.6 C), temperature source Temporal, resp. rate 18, height 5' 5 (1.651 m), weight 119 lb 11.2 oz (54.3 kg), SpO2 99%.    HEENT: No thrush or ulcers Resp: Lungs clear bilaterally Cardio: Regular rate and rhythm GI: No hepatosplenomegaly, nontender, no mass Vascular: No leg edema Neuro: Mild loss of vibratory sense at the fingertips bilaterally   Portacath/PICC-without erythema  Lab Results:  Lab Results  Component Value Date   WBC 6.7 02/14/2024   HGB 12.0 02/14/2024   HCT 35.9 (L) 02/14/2024   MCV 101.7 (H) 02/14/2024   PLT 464 (H) 02/14/2024   NEUTROABS 3.5 02/14/2024    CMP  Lab Results  Component Value Date   NA 139 01/16/2024   K 4.4 01/16/2024   CL 102 01/16/2024   CO2 27 01/16/2024   GLUCOSE 141 (H) 01/16/2024   BUN 11 01/16/2024   CREATININE 0.71 01/16/2024   CALCIUM  9.0 01/16/2024   PROT 6.6 01/16/2024   ALBUMIN 4.0 01/16/2024   AST 27 01/16/2024   ALT 21 01/16/2024   ALKPHOS 156 (H) 01/16/2024   BILITOT <0.2 01/16/2024   GFRNONAA >60 01/16/2024    GFRAA >60 09/13/2018    Lab Results  Component Value Date   RJW800 42 (H) 01/17/2024    Medications: I have reviewed the patient's current medications.   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-FNA of pancreas body lesion: Adenocarcinoma 07/22/2018 distal pancreatectomy and splenectomy, adenocarcinoma with mucinous component, moderately differentiated, 0/16 nodes, negative margins,pT1pN0 Adjuvant FOLFIRINOX completed 02/22/2019 10/13/2023 CT Abdo/pelvis: Distended stomach, no evidence of recurrent tumor or distant metastatic disease 10/18/2023 EGD with stricture, status post a gastrojejunostomy 10/24/2023: Firm mass involving the head of the pancreas and duodenal bulb, serosal implant biopsy positive for adenocarcinoma-moderately differentiated, foundation 1-BRCA2 rearrangement, KRAS G 12V 11/08/2023 CTs at [facility]: Unchanged tumor in the anterior pancreas neck inseparable from the duodenal bulb, new hepatic segment 4 subcapsular lesion increased size and conspicuity of subcentimeter peripancreatic/anterior central mesenteric nodes 11/25/2023: Cycle 1 NALIRIFOX 12/13/2023: Cycle 2 NALIRIFOX, oxaliplatin  further dose reduced and irinotecan held, G-CSF added 01/03/2024: Cycle 3 NALIRIFOX, irinotecan held, G -CSF 01/17/2024: Cycle 4 NALIRIFOX, irinotecan held, G-CSF 01/27/2024 [facility]: Decrease of the ill-defined mass between the anterior pancreas head/neck and posterior duodenal bulb, previous hypodense posterior segment 4 lesion not visualized 02/14/2024: Cycle 4 NALIRIFOX, irinotecan was held, G-CSF  2.   Admission 11/29/2023 with intractable nausea/vomiting 3.   Persistent nausea, diarrhea, and cramping abdominal pain following cycle 1 NALIRIFOX 4.   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  01/05/2024 germline CancerNext expanded panel: Negative         Disposition: Alyssa Harper has completed 4 cycles of salvage systemic therapy.   Liposomal irinotecan was eliminated from the treatment regimen following cycle 1.  She has tolerated the last several cycles of chemotherapy well.  The CA 19-9 is lower and a restaging CT reveals a decrease in the measurable peripancreatic mass. She has been seen in consultation by multiple oncology providers, most recently in [place].  The recommended plan is to continue salvage chemotherapy for 4 additional cycles prior to concurrent chemotherapy/radiation, and then surgery.  She will complete another cycle of chemotherapy today.  She will return for an office visit and chemotherapy in 2 weeks.  Germline testing was negative for a pathogenic mutation, including BRCA2.  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  She may be a candidate for a maintenance PARP inhibitor following surgery.    Arley Hof, MD  02/14/2024  8:52 AM

## 2024-02-15 LAB — CANCER ANTIGEN 19-9: CA 19-9: 25 U/mL (ref 0–35)

## 2024-02-16 ENCOUNTER — Inpatient Hospital Stay

## 2024-02-16 VITALS — BP 110/58 | HR 63 | Temp 97.9°F | Resp 18

## 2024-02-16 DIAGNOSIS — Z5111 Encounter for antineoplastic chemotherapy: Secondary | ICD-10-CM | POA: Diagnosis not present

## 2024-02-16 DIAGNOSIS — C259 Malignant neoplasm of pancreas, unspecified: Secondary | ICD-10-CM

## 2024-02-16 MED ORDER — PEGFILGRASTIM-JMDB 6 MG/0.6ML ~~LOC~~ SOSY
6.0000 mg | PREFILLED_SYRINGE | Freq: Once | SUBCUTANEOUS | Status: AC
  Administered 2024-02-16: 6 mg via SUBCUTANEOUS
  Filled 2024-02-16: qty 0.6

## 2024-02-16 MED ORDER — SODIUM CHLORIDE 0.9 % IV SOLN: INTRAVENOUS | Status: DC

## 2024-02-16 NOTE — Progress Notes (Signed)
 The patient was seen for pump removal, Fulphila  administration, and IVF procedures. The patient was successfully flushed and received her injection without any adverse reactions. She was educated on the updated protocol for port deaccessing, which now involves using only normal saline flushes and no heparin . The patient mentioned that she received heparin  during her last visit and was informed that this protocol changed approximately one month ago. A second nurse also reviewed the updated procedure with the patient, confirming that heparin  is no longer used.

## 2024-02-16 NOTE — Addendum Note (Signed)
 Addended by: Derral Colucci S on: 02/16/2024 01:25 PM   Modules accepted: Orders

## 2024-02-16 NOTE — Patient Instructions (Signed)

## 2024-02-25 ENCOUNTER — Other Ambulatory Visit: Payer: Self-pay | Admitting: Oncology

## 2024-02-27 ENCOUNTER — Inpatient Hospital Stay: Attending: Hematology and Oncology | Admitting: Oncology

## 2024-02-27 ENCOUNTER — Inpatient Hospital Stay

## 2024-02-27 ENCOUNTER — Other Ambulatory Visit

## 2024-02-27 VITALS — BP 103/73 | HR 71 | Temp 97.8°F | Resp 18 | Ht 65.0 in | Wt 117.4 lb

## 2024-02-27 DIAGNOSIS — C250 Malignant neoplasm of head of pancreas: Secondary | ICD-10-CM | POA: Insufficient documentation

## 2024-02-27 DIAGNOSIS — G620 Drug-induced polyneuropathy: Secondary | ICD-10-CM | POA: Diagnosis not present

## 2024-02-27 DIAGNOSIS — C259 Malignant neoplasm of pancreas, unspecified: Secondary | ICD-10-CM | POA: Diagnosis not present

## 2024-02-27 DIAGNOSIS — R112 Nausea with vomiting, unspecified: Secondary | ICD-10-CM | POA: Insufficient documentation

## 2024-02-27 DIAGNOSIS — Z5189 Encounter for other specified aftercare: Secondary | ICD-10-CM | POA: Diagnosis not present

## 2024-02-27 DIAGNOSIS — R109 Unspecified abdominal pain: Secondary | ICD-10-CM | POA: Insufficient documentation

## 2024-02-27 DIAGNOSIS — R197 Diarrhea, unspecified: Secondary | ICD-10-CM | POA: Diagnosis not present

## 2024-02-27 DIAGNOSIS — Z5111 Encounter for antineoplastic chemotherapy: Secondary | ICD-10-CM | POA: Insufficient documentation

## 2024-02-27 LAB — CBC WITH DIFFERENTIAL (CANCER CENTER ONLY)
Abs Immature Granulocytes: 0.06 K/uL (ref 0.00–0.07)
Basophils Absolute: 0.1 K/uL (ref 0.0–0.1)
Basophils Relative: 1 %
Eosinophils Absolute: 0.4 K/uL (ref 0.0–0.5)
Eosinophils Relative: 4 %
HCT: 39 % (ref 36.0–46.0)
Hemoglobin: 12.7 g/dL (ref 12.0–15.0)
Immature Granulocytes: 1 %
Lymphocytes Relative: 23 %
Lymphs Abs: 2.2 K/uL (ref 0.7–4.0)
MCH: 33.3 pg (ref 26.0–34.0)
MCHC: 32.6 g/dL (ref 30.0–36.0)
MCV: 102.4 fL — ABNORMAL HIGH (ref 80.0–100.0)
Monocytes Absolute: 1.1 K/uL — ABNORMAL HIGH (ref 0.1–1.0)
Monocytes Relative: 11 %
Neutro Abs: 5.8 K/uL (ref 1.7–7.7)
Neutrophils Relative %: 60 %
Platelet Count: 390 K/uL (ref 150–400)
RBC: 3.81 MIL/uL — ABNORMAL LOW (ref 3.87–5.11)
RDW: 14.1 % (ref 11.5–15.5)
WBC Count: 9.6 K/uL (ref 4.0–10.5)
nRBC: 0.5 % — ABNORMAL HIGH (ref 0.0–0.2)

## 2024-02-27 LAB — CMP (CANCER CENTER ONLY)
ALT: 15 U/L (ref 0–44)
AST: 22 U/L (ref 15–41)
Albumin: 4.2 g/dL (ref 3.5–5.0)
Alkaline Phosphatase: 178 U/L — ABNORMAL HIGH (ref 38–126)
Anion gap: 11 (ref 5–15)
BUN: 11 mg/dL (ref 8–23)
CO2: 25 mmol/L (ref 22–32)
Calcium: 9.5 mg/dL (ref 8.9–10.3)
Chloride: 103 mmol/L (ref 98–111)
Creatinine: 0.67 mg/dL (ref 0.44–1.00)
GFR, Estimated: >60 mL/min (ref >60)
Glucose, Bld: 106 mg/dL — ABNORMAL HIGH (ref 70–99)
Potassium: 4.8 mmol/L (ref 3.5–5.1)
Sodium: 139 mmol/L (ref 135–145)
Total Bilirubin: 0.2 mg/dL (ref 0.0–1.2)
Total Protein: 6.9 g/dL (ref 6.5–8.1)

## 2024-02-27 MED ORDER — SODIUM CHLORIDE 0.9 % IV SOLN
1200.0000 mg/m2 | INTRAVENOUS | Status: DC
  Administered 2024-02-28: 2000 mg via INTRAVENOUS
  Filled 2024-02-27: qty 40

## 2024-02-27 NOTE — Progress Notes (Signed)
 [facility] OFFICE PROGRESS NOTE   Diagnosis: Pancreas cancer  INTERVAL HISTORY:   Alyssa Harper completed another cycle of chemotherapy beginning 02/14/2024.  She reports minimal nausea following chemotherapy.  She had constipation for a few days.  She has intermittent tingling in the hands.  This does not interfere with activity.  She reports bone pain for several days following G-CSF.  Her chief complaint is prolonged malaise following chemotherapy.  She had transient pain at the Port-A-Cath site after the port was deaccessed.  No neck or arm swelling.  Objective:  Vital signs in last 24 hours:  Blood pressure 103/73, pulse 71, temperature 97.8 F (36.6 C), temperature source Temporal, resp. rate 18, height 5' 5 (1.651 m), weight 117 lb 6.4 oz (53.3 kg), SpO2 98%.    HEENT: No thrush or ulcers Resp: Lungs clear bilaterally Cardio: Regular rate and rhythm GI: No hepatosplenomegaly Vascular: No leg edema Neuro: Mild to moderate loss of vibratory sense at the fingertips bilaterally   Portacath/PICC-without erythema  Lab Results:  Lab Results  Component Value Date   WBC 9.6 02/27/2024   HGB 12.7 02/27/2024   HCT 39.0 02/27/2024   MCV 102.4 (H) 02/27/2024   PLT 390 02/27/2024   NEUTROABS 5.8 02/27/2024    CMP  Lab Results  Component Value Date   NA 138 02/14/2024   K 4.0 02/14/2024   CL 102 02/14/2024   CO2 25 02/14/2024   GLUCOSE 138 (H) 02/14/2024   BUN 9 02/14/2024   CREATININE 0.61 02/14/2024   CALCIUM  9.3 02/14/2024   PROT 6.4 (L) 02/14/2024   ALBUMIN 4.1 02/14/2024   AST 22 02/14/2024   ALT 15 02/14/2024   ALKPHOS 116 02/14/2024   BILITOT 0.3 02/14/2024   GFRNONAA >60 02/14/2024   GFRAA >60 09/13/2018    Lab Results  Component Value Date   RJW800 25 02/14/2024    Medications: I have reviewed the patient's current medications.   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-FNA of pancreas body lesion:  Adenocarcinoma 07/22/2018 distal pancreatectomy and splenectomy, adenocarcinoma with mucinous component, moderately differentiated, 0/16 nodes, negative margins,pT1pN0 Adjuvant FOLFIRINOX completed 02/22/2019 10/13/2023 CT Abdo/pelvis: Distended stomach, no evidence of recurrent tumor or distant metastatic disease 10/18/2023 EGD with stricture, status post a gastrojejunostomy 10/24/2023: Firm mass involving the head of the pancreas and duodenal bulb, serosal implant biopsy positive for adenocarcinoma-moderately differentiated, foundation 1-BRCA2 rearrangement, KRAS G 12V 11/08/2023 CTs at [facility]: Unchanged tumor in the anterior pancreas neck inseparable from the duodenal bulb, new hepatic segment 4 subcapsular lesion increased size and conspicuity of subcentimeter peripancreatic/anterior central mesenteric nodes 11/25/2023: Cycle 1 NALIRIFOX 12/13/2023: Cycle 2 NALIRIFOX, oxaliplatin  further dose reduced and irinotecan held, G-CSF added 01/03/2024: Cycle 3 NALIRIFOX, irinotecan held, G -CSF 01/17/2024: Cycle 4 NALIRIFOX, irinotecan held, G-CSF 01/27/2024 CT[facility] at [facility]: Decrease of the ill-defined mass between the anterior pancreas head/neck and posterior duodenal bulb, previous hypodense posterior segment 4 lesion not visualized 02/14/2024: Cycle 4 NALIRIFOX, irinotecan was held, G-CSF 02/28/2024: Cycle 5 NALIRIFOX, irinotecan held, G-CSF  2.   Admission 11/29/2023 with intractable nausea/vomiting 3.   Persistent nausea, diarrhea, and cramping abdominal pain following cycle 1 NALIRIFOX 4.   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  01/05/2024 germline CancerNext expanded panel: Negative      Disposition: Alyssa Harper she will complete another cycle of chemotherapy tomorrow.  She is developing oxaliplatin  neuropathy.  The neuropathy symptoms are mild at present.  She had  bone pain following G-CSF with the last cycle of chemotherapy.  We discussed the indication for  continuing G-CSF.  She agrees to continue G-CSF and attempt to remain on the schedule for the planned radiation.  She will return for an office visit and chemotherapy in 2 weeks.  She will call for recurrent pain at the Port-A-Cath site.  Arley Hof, MD  02/27/2024  8:15 AM

## 2024-02-28 ENCOUNTER — Inpatient Hospital Stay

## 2024-02-28 VITALS — BP 114/59 | HR 81 | Temp 98.3°F | Resp 18

## 2024-02-28 DIAGNOSIS — C259 Malignant neoplasm of pancreas, unspecified: Secondary | ICD-10-CM

## 2024-02-28 DIAGNOSIS — Z5111 Encounter for antineoplastic chemotherapy: Secondary | ICD-10-CM | POA: Diagnosis not present

## 2024-02-28 LAB — CANCER ANTIGEN 19-9: CA 19-9: 35 U/mL (ref 0–35)

## 2024-02-28 MED ORDER — PALONOSETRON HCL INJECTION 0.25 MG/5ML
0.2500 mg | Freq: Once | INTRAVENOUS | Status: AC
  Administered 2024-02-28: 0.25 mg via INTRAVENOUS

## 2024-02-28 MED ORDER — DEXTROSE 5 % IV SOLN: INTRAVENOUS | Status: AC

## 2024-02-28 MED ORDER — DIPHENHYDRAMINE HCL 25 MG PO CAPS
25.0000 mg | ORAL_CAPSULE | Freq: Once | ORAL | Status: AC
  Administered 2024-02-28: 25 mg via ORAL

## 2024-02-28 MED ORDER — OXALIPLATIN CHEMO INJECTION 100 MG/20ML
42.5000 mg/m2 | Freq: Once | INTRAVENOUS | Status: AC
  Administered 2024-02-28: 65 mg via INTRAVENOUS
  Filled 2024-02-28 (×2): qty 13

## 2024-02-28 MED ORDER — SODIUM CHLORIDE 0.9 % IV SOLN
150.0000 mg | Freq: Once | INTRAVENOUS | Status: AC
  Administered 2024-02-28: 150 mg via INTRAVENOUS
  Filled 2024-02-28: qty 5

## 2024-02-28 MED ORDER — DEXAMETHASONE 4 MG PO TABS
8.0000 mg | ORAL_TABLET | Freq: Once | ORAL | Status: AC
  Administered 2024-02-28: 8 mg via ORAL

## 2024-02-28 MED ORDER — FAMOTIDINE IN NACL 20-0.9 MG/50ML-% IV SOLN
20.0000 mg | Freq: Once | INTRAVENOUS | Status: AC
  Administered 2024-02-28: 20 mg via INTRAVENOUS

## 2024-02-28 NOTE — Patient Instructions (Signed)
 [facility] - A DEPT OF [facility]  Discharge Instructions: Thank you for choosing [facility] to provide your oncology and hematology care.   If you have a lab appointment with the Cancer Center, please go directly to the Cancer Center and check in at the registration area.   Wear comfortable clothing and clothing appropriate for easy access to any Portacath or PICC line.   We strive to give you quality time with your provider. You may need to reschedule your appointment if you arrive late (15 or more minutes).  Arriving late affects you and other patients whose appointments are after yours.  Also, if you miss three or more appointments without notifying the office, you may be dismissed from the clinic at the provider's discretion.      For prescription refill requests, have your pharmacy contact our office and allow 72 hours for refills to be completed.    Today you received the following chemotherapy and/or immunotherapy agents: oxaliplatin  and fluorouracil        To help prevent nausea and vomiting after your treatment, we encourage you to take your nausea medication as directed.  BELOW ARE SYMPTOMS THAT SHOULD BE REPORTED IMMEDIATELY: *FEVER GREATER THAN 100.4 F (38 C) OR HIGHER *CHILLS OR SWEATING *NAUSEA AND VOMITING THAT IS NOT CONTROLLED WITH YOUR NAUSEA MEDICATION *UNUSUAL SHORTNESS OF BREATH *UNUSUAL BRUISING OR BLEEDING *URINARY PROBLEMS (pain or burning when urinating, or frequent urination) *BOWEL PROBLEMS (unusual diarrhea, constipation, pain near the anus) TENDERNESS IN MOUTH AND THROAT WITH OR WITHOUT PRESENCE OF ULCERS (sore throat, sores in mouth, or a toothache) UNUSUAL RASH, SWELLING OR PAIN  UNUSUAL VAGINAL DISCHARGE OR ITCHING   Items with * indicate a potential emergency and should be followed up as soon as possible or go to the Emergency Department if any problems should occur.  Please show the CHEMOTHERAPY ALERT  CARD or IMMUNOTHERAPY ALERT CARD at check-in to the Emergency Department and triage nurse.  Should you have questions after your visit or need to cancel or reschedule your appointment, please contact [facility] - A DEPT OF [facility]  Dept: [phone]  and follow the prompts.  Office hours are 8:00 a.m. to 4:30 p.m. Monday - Friday. Please note that voicemails left after 4:00 p.m. may not be returned until the following business day.  We are closed weekends and major holidays. You have access to a nurse at all times for urgent questions. Please call the main number to the clinic Dept: [phone] and follow the prompts.   For any non-urgent questions, you may also contact your provider using MyChart. We now offer e-Visits for anyone 61 and older to request care online for non-urgent symptoms. For details visit [email or web address].   Also download the MyChart app! Go to the app store, search MyChart, open the app, select [place], and log in with your MyChart username and password.

## 2024-02-29 ENCOUNTER — Encounter: Payer: Self-pay | Admitting: Oncology

## 2024-02-29 ENCOUNTER — Other Ambulatory Visit: Payer: Self-pay | Admitting: *Deleted

## 2024-02-29 ENCOUNTER — Other Ambulatory Visit: Payer: Self-pay

## 2024-02-29 DIAGNOSIS — C259 Malignant neoplasm of pancreas, unspecified: Secondary | ICD-10-CM

## 2024-03-01 ENCOUNTER — Inpatient Hospital Stay

## 2024-03-01 VITALS — BP 101/66 | HR 66 | Temp 98.4°F | Resp 18

## 2024-03-01 DIAGNOSIS — C259 Malignant neoplasm of pancreas, unspecified: Secondary | ICD-10-CM

## 2024-03-01 DIAGNOSIS — Z5111 Encounter for antineoplastic chemotherapy: Secondary | ICD-10-CM | POA: Diagnosis not present

## 2024-03-01 MED ORDER — PEGFILGRASTIM-JMDB 6 MG/0.6ML ~~LOC~~ SOSY
6.0000 mg | PREFILLED_SYRINGE | Freq: Once | SUBCUTANEOUS | Status: AC
  Administered 2024-03-01: 6 mg via SUBCUTANEOUS
  Filled 2024-03-01: qty 0.6

## 2024-03-01 MED ORDER — SODIUM CHLORIDE 0.9 % IV SOLN: INTRAVENOUS | Status: AC

## 2024-03-01 NOTE — Progress Notes (Signed)
 Upon completion of infusion, pt reports already feeling better with hydration. Departed ambulatory with her husband.

## 2024-03-01 NOTE — Patient Instructions (Signed)

## 2024-03-09 ENCOUNTER — Ambulatory Visit: Admitting: Internal Medicine

## 2024-03-12 ENCOUNTER — Other Ambulatory Visit

## 2024-03-12 ENCOUNTER — Other Ambulatory Visit: Payer: Self-pay | Admitting: *Deleted

## 2024-03-12 ENCOUNTER — Encounter: Payer: Self-pay | Admitting: Oncology

## 2024-03-12 ENCOUNTER — Inpatient Hospital Stay

## 2024-03-12 ENCOUNTER — Inpatient Hospital Stay (HOSPITAL_BASED_OUTPATIENT_CLINIC_OR_DEPARTMENT_OTHER): Admitting: Oncology

## 2024-03-12 ENCOUNTER — Telehealth: Payer: Self-pay | Admitting: Oncology

## 2024-03-12 VITALS — BP 96/69 | HR 77 | Temp 97.9°F | Resp 18 | Ht 65.0 in | Wt 122.0 lb

## 2024-03-12 DIAGNOSIS — C259 Malignant neoplasm of pancreas, unspecified: Secondary | ICD-10-CM

## 2024-03-12 DIAGNOSIS — Z5111 Encounter for antineoplastic chemotherapy: Secondary | ICD-10-CM | POA: Diagnosis not present

## 2024-03-12 LAB — CBC WITH DIFFERENTIAL (CANCER CENTER ONLY)
Abs Immature Granulocytes: 0.1 K/uL — ABNORMAL HIGH (ref 0.00–0.07)
Basophils Absolute: 0.1 K/uL (ref 0.0–0.1)
Basophils Relative: 1 %
Eosinophils Absolute: 0.3 K/uL (ref 0.0–0.5)
Eosinophils Relative: 3 %
HCT: 40 % (ref 36.0–46.0)
Hemoglobin: 12.9 g/dL (ref 12.0–15.0)
Immature Granulocytes: 1 %
Lymphocytes Relative: 26 %
Lymphs Abs: 2.8 K/uL (ref 0.7–4.0)
MCH: 33.2 pg (ref 26.0–34.0)
MCHC: 32.3 g/dL (ref 30.0–36.0)
MCV: 103.1 fL — ABNORMAL HIGH (ref 80.0–100.0)
Monocytes Absolute: 0.9 K/uL (ref 0.1–1.0)
Monocytes Relative: 8 %
Neutro Abs: 6.5 K/uL (ref 1.7–7.7)
Neutrophils Relative %: 61 %
Platelet Count: 365 K/uL (ref 150–400)
RBC: 3.88 MIL/uL (ref 3.87–5.11)
RDW: 14.1 % (ref 11.5–15.5)
WBC Count: 10.7 K/uL — ABNORMAL HIGH (ref 4.0–10.5)
nRBC: 0.3 % — ABNORMAL HIGH (ref 0.0–0.2)

## 2024-03-12 LAB — CMP (CANCER CENTER ONLY)
ALT: 27 U/L (ref 0–44)
AST: 32 U/L (ref 15–41)
Albumin: 4.2 g/dL (ref 3.5–5.0)
Alkaline Phosphatase: 190 U/L — ABNORMAL HIGH (ref 38–126)
Anion gap: 10 (ref 5–15)
BUN: 11 mg/dL (ref 8–23)
CO2: 27 mmol/L (ref 22–32)
Calcium: 9.5 mg/dL (ref 8.9–10.3)
Chloride: 104 mmol/L (ref 98–111)
Creatinine: 0.71 mg/dL (ref 0.44–1.00)
GFR, Estimated: >60 mL/min (ref >60)
Glucose, Bld: 134 mg/dL — ABNORMAL HIGH (ref 70–99)
Potassium: 4.7 mmol/L (ref 3.5–5.1)
Sodium: 140 mmol/L (ref 135–145)
Total Bilirubin: 0.3 mg/dL (ref 0.0–1.2)
Total Protein: 6.7 g/dL (ref 6.5–8.1)

## 2024-03-12 NOTE — Progress Notes (Signed)
 [facility] OFFICE PROGRESS NOTE   Diagnosis: Pancreas cancer  INTERVAL HISTORY:   Alyssa Harper completed another cycle of chemotherapy beginning 02/28/2024.  She had less bone pain following this cycle of chemotherapy.  She took prophylactic Claritin.  She has intermittent burning discomfort in the upper abdomen.  Good appetite.  She is working.  She has intermittent tingling in the fingertips.  None in the toes.  She has difficulty opening jars.  Objective:  Vital signs in last 24 hours:  Blood pressure 96/69, pulse 77, temperature 97.9 F (36.6 C), temperature source Temporal, resp. rate 18, height 5' 5 (1.651 m), weight 122 lb (55.3 kg), SpO2 97%.    HEENT: No thrush, healing ulcer/ecchymosis of the right buccal mucosa Resp: Lungs clear bilaterally Cardio: Regular rate and rhythm GI: Nontender, no mass, no hepatosplenomegaly, no apparent ascites Vascular: No leg edema Neuro: Mild to moderate loss of vibratory sense at the fingertips bilaterally    Portacath/PICC-without erythema  Lab Results:  Lab Results  Component Value Date   WBC 10.7 (H) 03/12/2024   HGB 12.9 03/12/2024   HCT 40.0 03/12/2024   MCV 103.1 (H) 03/12/2024   PLT 365 03/12/2024   NEUTROABS 6.5 03/12/2024    CMP  Lab Results  Component Value Date   NA 139 02/27/2024   K 4.8 02/27/2024   CL 103 02/27/2024   CO2 25 02/27/2024   GLUCOSE 106 (H) 02/27/2024   BUN 11 02/27/2024   CREATININE 0.67 02/27/2024   CALCIUM  9.5 02/27/2024   PROT 6.9 02/27/2024   ALBUMIN 4.2 02/27/2024   AST 22 02/27/2024   ALT 15 02/27/2024   ALKPHOS 178 (H) 02/27/2024   BILITOT 0.2 02/27/2024   GFRNONAA >60 02/27/2024   GFRAA >60 09/13/2018    Lab Results  Component Value Date   RJW800 35 02/27/2024    No results found for: INR, LABPROT  Imaging:  No results found.  Medications: I have reviewed the patient's current medications.   Assessment/Plan: Pancreas cancer diagnosed in January  2020-FNA of pancreas body lesion: Adenocarcinoma 07/22/2018 distal pancreatectomy and splenectomy, adenocarcinoma with mucinous component, moderately differentiated, 0/16 nodes, negative margins,pT1pN0 Adjuvant FOLFIRINOX completed 02/22/2019 10/13/2023 CT Abdo/pelvis: Distended stomach, no evidence of recurrent tumor or distant metastatic disease 10/18/2023 EGD with stricture, status post a gastrojejunostomy 10/24/2023: Firm mass involving the head of the pancreas and duodenal bulb, serosal implant biopsy positive for adenocarcinoma-moderately differentiated, foundation 1-BRCA2 rearrangement, KRAS G 12V 11/08/2023 CTs at [facility]: Unchanged tumor in the anterior pancreas neck inseparable from the duodenal bulb, new hepatic segment 4 subcapsular lesion increased size and conspicuity of subcentimeter peripancreatic/anterior central mesenteric nodes 11/25/2023: Cycle 1 NALIRIFOX 12/13/2023: Cycle 2 NALIRIFOX, oxaliplatin  further dose reduced and irinotecan held, G-CSF added 01/03/2024: Cycle 3 NALIRIFOX, irinotecan held, G -CSF 01/17/2024: Cycle 4 NALIRIFOX, irinotecan held, G-CSF 01/27/2024 CT[facility] at [facility]: Decrease of the ill-defined mass between the anterior pancreas head/neck and posterior duodenal bulb, previous hypodense posterior segment 4 lesion not visualized 02/14/2024: Cycle 5 NALIRIFOX, irinotecan was held, G-CSF 02/28/2024: Cycle 6 NALIRIFOX, irinotecan held, G-CSF 03/13/2024: Cycle 7 NALIRIFOX, irinotecan held, G-CSF  2.   Admission 11/29/2023 with intractable nausea/vomiting 3.   Persistent nausea, diarrhea, and cramping abdominal pain following cycle 1 NALIRIFOX 4.   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  01/05/2024 germline CancerNext expanded panel: Negative       Disposition: Alyssa Harper appears stable.  She has tolerated the last several cycles  of chemotherapy well.  She will complete another cycle of chemotherapy tomorrow.  She has developed mild  oxaliplatin  neuropathy.  The neuropathy symptoms have not changed significantly over the past several weeks.  The plan is to continue treatment with oxaliplatin .  She will return for an office visit and chemotherapy in 2 weeks.  She is scheduled for concurrent capecitabine and radiation in November.  I will coordinate the capecitabine dosing with the [facility] oncology team.  She will take pantoprazole  daily in an attempt to improve the upper abdominal burning discomfort.    Arley Hof, MD  03/12/2024  8:12 AM

## 2024-03-12 NOTE — Telephone Encounter (Signed)
 Called PT to sch [id number] appt per inbasket message, PT wants to hold off on this appt right now. She also has a similar appt with  another provider and will call and let us  know if she wants to move forward with this appt or not.

## 2024-03-13 ENCOUNTER — Inpatient Hospital Stay

## 2024-03-13 VITALS — BP 98/84 | HR 77 | Temp 98.1°F | Resp 18

## 2024-03-13 DIAGNOSIS — Z5111 Encounter for antineoplastic chemotherapy: Secondary | ICD-10-CM | POA: Diagnosis not present

## 2024-03-13 DIAGNOSIS — C259 Malignant neoplasm of pancreas, unspecified: Secondary | ICD-10-CM

## 2024-03-13 LAB — CANCER ANTIGEN 19-9: CA 19-9: 44 U/mL — ABNORMAL HIGH (ref 0–35)

## 2024-03-13 MED ORDER — OXALIPLATIN CHEMO INJECTION 100 MG/20ML
42.5000 mg/m2 | Freq: Once | INTRAVENOUS | Status: AC
  Administered 2024-03-13: 65 mg via INTRAVENOUS
  Filled 2024-03-13: qty 13

## 2024-03-13 MED ORDER — DEXAMETHASONE 4 MG PO TABS
8.0000 mg | ORAL_TABLET | Freq: Once | ORAL | Status: AC
  Administered 2024-03-13: 8 mg via ORAL
  Filled 2024-03-13: qty 2

## 2024-03-13 MED ORDER — FAMOTIDINE IN NACL 20-0.9 MG/50ML-% IV SOLN
20.0000 mg | Freq: Once | INTRAVENOUS | Status: AC
  Administered 2024-03-13: 20 mg via INTRAVENOUS
  Filled 2024-03-13: qty 50

## 2024-03-13 MED ORDER — DIPHENHYDRAMINE HCL 25 MG PO CAPS
25.0000 mg | ORAL_CAPSULE | Freq: Once | ORAL | Status: AC
  Administered 2024-03-13: 25 mg via ORAL
  Filled 2024-03-13: qty 1

## 2024-03-13 MED ORDER — PALONOSETRON HCL INJECTION 0.25 MG/5ML
0.2500 mg | Freq: Once | INTRAVENOUS | Status: AC
  Administered 2024-03-13: 0.25 mg via INTRAVENOUS
  Filled 2024-03-13: qty 5

## 2024-03-13 MED ORDER — SODIUM CHLORIDE 0.9 % IV SOLN: INTRAVENOUS | Status: DC

## 2024-03-13 MED ORDER — DEXTROSE 5 % IV SOLN: INTRAVENOUS | Status: AC

## 2024-03-13 MED ORDER — SODIUM CHLORIDE 0.9 % IV SOLN
150.0000 mg | Freq: Once | INTRAVENOUS | Status: AC
  Administered 2024-03-13: 150 mg via INTRAVENOUS
  Filled 2024-03-13: qty 5

## 2024-03-13 MED ORDER — SODIUM CHLORIDE 0.9 % IV SOLN
1200.0000 mg/m2 | INTRAVENOUS | Status: DC
  Administered 2024-03-13: 2000 mg via INTRAVENOUS
  Filled 2024-03-13: qty 40

## 2024-03-13 NOTE — Patient Instructions (Signed)
 [facility] - A DEPT OF [facility]  Discharge Instructions: Thank you for choosing [facility] to provide your oncology and hematology care.   If you have a lab appointment with the Cancer Center, please go directly to the Cancer Center and check in at the registration area.   Wear comfortable clothing and clothing appropriate for easy access to any Portacath or PICC line.   We strive to give you quality time with your provider. You may need to reschedule your appointment if you arrive late (15 or more minutes).  Arriving late affects you and other patients whose appointments are after yours.  Also, if you miss three or more appointments without notifying the office, you may be dismissed from the clinic at the provider's discretion.      For prescription refill requests, have your pharmacy contact our office and allow 72 hours for refills to be completed.    Today you received the following chemotherapy and/or immunotherapy agents: oxaliplatin , fluorouracil        To help prevent nausea and vomiting after your treatment, we encourage you to take your nausea medication as directed.  BELOW ARE SYMPTOMS THAT SHOULD BE REPORTED IMMEDIATELY: *FEVER GREATER THAN 100.4 F (38 C) OR HIGHER *CHILLS OR SWEATING *NAUSEA AND VOMITING THAT IS NOT CONTROLLED WITH YOUR NAUSEA MEDICATION *UNUSUAL SHORTNESS OF BREATH *UNUSUAL BRUISING OR BLEEDING *URINARY PROBLEMS (pain or burning when urinating, or frequent urination) *BOWEL PROBLEMS (unusual diarrhea, constipation, pain near the anus) TENDERNESS IN MOUTH AND THROAT WITH OR WITHOUT PRESENCE OF ULCERS (sore throat, sores in mouth, or a toothache) UNUSUAL RASH, SWELLING OR PAIN  UNUSUAL VAGINAL DISCHARGE OR ITCHING   Items with * indicate a potential emergency and should be followed up as soon as possible or go to the Emergency Department if any problems should occur.  Please show the CHEMOTHERAPY ALERT CARD  or IMMUNOTHERAPY ALERT CARD at check-in to the Emergency Department and triage nurse.  Should you have questions after your visit or need to cancel or reschedule your appointment, please contact [facility] - A DEPT OF [facility]  Dept: [phone]  and follow the prompts.  Office hours are 8:00 a.m. to 4:30 p.m. Monday - Friday. Please note that voicemails left after 4:00 p.m. may not be returned until the following business day.  We are closed weekends and major holidays. You have access to a nurse at all times for urgent questions. Please call the main number to the clinic Dept: [phone] and follow the prompts.   For any non-urgent questions, you may also contact your provider using MyChart. We now offer e-Visits for anyone 64 and older to request care online for non-urgent symptoms. For details visit [email or web address].   Also download the MyChart app! Go to the app store, search MyChart, open the app, select , and log in with your MyChart username and password.

## 2024-03-15 ENCOUNTER — Inpatient Hospital Stay

## 2024-03-15 VITALS — BP 102/64 | HR 65 | Temp 98.2°F | Resp 18

## 2024-03-15 DIAGNOSIS — Z5111 Encounter for antineoplastic chemotherapy: Secondary | ICD-10-CM | POA: Diagnosis not present

## 2024-03-15 DIAGNOSIS — C259 Malignant neoplasm of pancreas, unspecified: Secondary | ICD-10-CM

## 2024-03-15 MED ORDER — SODIUM CHLORIDE 0.9% FLUSH: 10.0000 mL | INTRAVENOUS | Status: DC | PRN

## 2024-03-15 MED ORDER — SODIUM CHLORIDE 0.9 % IV SOLN: INTRAVENOUS | Status: DC

## 2024-03-15 MED ORDER — PROMETHAZINE HCL 25 MG PO TABS
12.5000 mg | ORAL_TABLET | Freq: Once | ORAL | Status: AC
  Administered 2024-03-15: 12.5 mg via ORAL
  Filled 2024-03-15: qty 1

## 2024-03-15 MED ORDER — PEGFILGRASTIM-JMDB 6 MG/0.6ML ~~LOC~~ SOSY
6.0000 mg | PREFILLED_SYRINGE | Freq: Once | SUBCUTANEOUS | Status: AC
  Administered 2024-03-15: 6 mg via SUBCUTANEOUS
  Filled 2024-03-15: qty 0.6

## 2024-03-15 NOTE — Patient Instructions (Signed)

## 2024-03-20 ENCOUNTER — Ambulatory Visit (INDEPENDENT_AMBULATORY_CARE_PROVIDER_SITE_OTHER): Admitting: Internal Medicine

## 2024-03-20 ENCOUNTER — Encounter: Payer: Self-pay | Admitting: Internal Medicine

## 2024-03-20 VITALS — BP 120/78 | HR 75 | Ht 65.0 in | Wt 124.2 lb

## 2024-03-20 DIAGNOSIS — C259 Malignant neoplasm of pancreas, unspecified: Secondary | ICD-10-CM | POA: Diagnosis not present

## 2024-03-20 DIAGNOSIS — E891 Postprocedural hypoinsulinemia: Secondary | ICD-10-CM

## 2024-03-20 DIAGNOSIS — E139 Other specified diabetes mellitus without complications: Secondary | ICD-10-CM

## 2024-03-20 DIAGNOSIS — Z90410 Acquired total absence of pancreas: Secondary | ICD-10-CM

## 2024-03-20 NOTE — Progress Notes (Unsigned)
 Name: Alyssa Harper  MRN/ DOB: [id number], August 09, 1960   Age/ Sex: 63 y.o., female    PCP: Tisovec, Charlie ORN, MD   Reason for Endocrinology Evaluation: Pancreatic adenocarcinoma     Date of Initial Endocrinology Visit: 03/20/2024     PATIENT IDENTIFIER: Alyssa Harper is a 63 y.o. female with a past medical history of pancreatic adenocarcinoma. The patient presented for initial endocrinology clinic visit on 03/20/2024 for consultative assistance with her diabetes management.    HPI: Alyssa Harper was referred here by oncology   The patient was diagnosed with pancreatic adenocarcinoma, s/p distal pancreatectomy+ splenectomy in 2020 at [facility]  She did receive chemotherapy through September 2020.   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  She underwent palliative open gastrojejunostomy in May, 2025 which demonstrated a firm mass involving the pancreas and the duodenal bulb, with biopsy significant for moderately differentiated adenocarcinoma.  She completed 4 chemo cycles from June to July, 2025  The patient had a follow-up with general surgery, it was recommended to continue chemotherapy followed by short chemoradiation course, followed by surgery.  The plan is to have Whipple's procedure   The patient is scheduled for surgery 05/14/2024 through [facility]  She has 3 meals a day  She has bloating  and epigastric tightness  Has nausea with chemo  No diarrhea, has occasional constipation    The patient has no history of diabetes    No family history of diabetes Maternal Grandfather with hx pancreatic acid     PAST HISTORY: Past Medical History:  Past Medical History:  Diagnosis Date   Family history of adverse reaction to anesthesia    grandmother stopped breathing   Headache    weekly (07/03/2018)   High cholesterol    Mitral valve prolapse    Pancreatic  mass 07/03/2018   Sjogren's disease    Past Surgical History:  Past Surgical History:  Procedure Laterality Date   AUGMENTATION MAMMAPLASTY Bilateral 2013   ESOPHAGOGASTRODUODENOSCOPY N/A 07/05/2018   Procedure: ESOPHAGOGASTRODUODENOSCOPY (EGD);  Surgeon: Burnette Fallow, MD;  Location: [facility];  Service: Endoscopy;  Laterality: N/A;   EUS N/A 07/05/2018   Procedure: UPPER ENDOSCOPIC ULTRASOUND (EUS) LINEAR and Radial;  Surgeon: Burnette Fallow, MD;  Location: [facility];  Service: Endoscopy;  Laterality: N/A;   FINE NEEDLE ASPIRATION N/A 07/05/2018   Procedure: FINE NEEDLE ASPIRATION (FNA) LINEAR;  Surgeon: Burnette Fallow, MD;  Location: [facility];  Service: Endoscopy;  Laterality: N/A;    Social History:  reports that she has never smoked. She has never used smokeless tobacco. She reports current alcohol use of about 7.0 standard drinks of alcohol per week. She reports that she does not use drugs. Family History: No family history on file.   HOME MEDICATIONS: Allergies as of 03/20/2024       Reactions   Tape Dermatitis, Other (See Comments)   IV3000 DRESSING FOR PORT ACCESS - Tegaderm cause irritation to the surrounding skin   Wound Dressing Adhesive Dermatitis, Other (See Comments)   IV3000 DRESSING FOR PORT ACCESS - Tegaderm cause irritation to the surrounding skin   Pollen Extract Other (See Comments)   Hayfever   Penicillins Rash        Medication List        Accurate as of March 20, 2024 12:44 PM. If you have any questions, ask your nurse or doctor.  Creon  24000-76000 units Cpep Generic drug: Pancrelipase  (Lip-Prot-Amyl) Take 1 capsule (24,000 Units total) by mouth 3 (three) times daily with meals as needed (to aid food digestion- as determined by the patient).   dexamethasone  4 MG tablet Commonly known as: DECADRON  Take 8 mg by mouth See admin instructions. Take 8 mg (2 tablets) by mouth once a day for 2 days after chemo   famotidine  40  MG tablet Commonly known as: PEPCID  Take 40 mg by mouth 2 (two) times daily before a meal.   lidocaine-prilocaine cream Commonly known as: EMLA Apply 1 application  topically as needed (for port access).   LORazepam  0.5 MG tablet Commonly known as: ATIVAN  Place 1 tablet (0.5 mg total) under the tongue every 6 (six) hours as needed (nausea).   magic mouthwash Soln Take 5 mLs by mouth 4 (four) times daily as needed for mouth pain. Suspension contains equal amounts of Maalox Extra Strength, nystatin, and diphenhydramine .   neomycin-polymyxin-dexameth 0.1 % Oint Commonly known as: MAXITROL Place 1 Application into the left eye 2 (two) times daily as needed (for irritation).   ondansetron  8 MG tablet Commonly known as: ZOFRAN  Take 8 mg by mouth every 8 (eight) hours as needed for nausea or vomiting.   oxyCODONE  5 MG immediate release tablet Commonly known as: Oxy IR/ROXICODONE  Take 2.5-5 mg by mouth every 4 (four) hours as needed (for pain).   pantoprazole  40 MG tablet Commonly known as: PROTONIX  Take 1 tablet (40 mg total) by mouth daily.   promethazine  12.5 MG tablet Commonly known as: PHENERGAN  Take 12.5 mg by mouth every 6 (six) hours as needed for nausea or vomiting.   TYLENOL  500 MG tablet Generic drug: acetaminophen  Take 250 mg by mouth every 6 (six) hours as needed for mild pain (pain score 1-3) or headache.         ALLERGIES: Allergies  Allergen Reactions   Tape Dermatitis and Other (See Comments)    IV3000 DRESSING FOR PORT ACCESS - Tegaderm cause irritation to the surrounding skin   Wound Dressing Adhesive Dermatitis and Other (See Comments)    IV3000 DRESSING FOR PORT ACCESS - Tegaderm cause irritation to the surrounding skin   Pollen Extract Other (See Comments)    Hayfever   Penicillins Rash     REVIEW OF SYSTEMS: A comprehensive ROS was conducted with the patient and is negative except as per HPI and below:  ROS    OBJECTIVE:   VITAL SIGNS:  There were no vitals taken for this visit.   PHYSICAL EXAM:  General: Pt appears well and is in NAD  Neck: General: Supple without adenopathy or carotid bruits. Thyroid : Thyroid  size normal.  No goiter or nodules appreciated.   Lungs: Clear with good BS bilat   Heart: RRR   Abdomen:  soft, nontender  Extremities:  Lower extremities - No pretibial edema.   Skin:  No rash noted.  Neuro: MS is good with appropriate affect, pt is alert and Ox3    DM foot exam:    DATA REVIEWED:  Lab Results  Component Value Date   HGBA1C 5.3 07/04/2018    Latest Reference Range & Units 03/12/24 07:51  Sodium 135 - 145 mmol/L 140  Potassium 3.5 - 5.1 mmol/L 4.7  Chloride 98 - 111 mmol/L 104  CO2 22 - 32 mmol/L 27  Glucose 70 - 99 mg/dL 865 (H)  BUN 8 - 23 mg/dL 11  Creatinine 9.55 - 8.99 mg/dL 9.28  Calcium  8.9 - 10.3 mg/dL  9.5  Anion gap 5 - 15  10  Alkaline Phosphatase 38 - 126 U/L 190 (H)  Albumin 3.5 - 5.0 g/dL 4.2  AST 15 - 41 U/L 32  ALT 0 - 44 U/L 27  Total Protein 6.5 - 8.1 g/dL 6.7  Total Bilirubin 0.0 - 1.2 mg/dL 0.3  GFR, Est Non African American >60 mL/min >60  (H): Data is abnormally high  ASSESSMENT / PLAN / RECOMMENDATIONS:   1) Type *** Diabetes Mellitus, ***controlled, With*** complications - Most recent A1c of *** %. Goal A1c < *** %.  ***  Plan: GENERAL: ***  MEDICATIONS: ***  EDUCATION / INSTRUCTIONS: BG monitoring instructions: Patient is instructed to check her blood sugars *** times a day, ***. Call [facility] if: BG persistently < 70  I reviewed the Rule of 15 for the treatment of hypoglycemia in detail with the patient. Literature supplied.   2) Diabetic complications:  Eye: Does *** have known diabetic retinopathy.  Neuro/ Feet: Does *** have known diabetic peripheral neuropathy. Renal: Patient does *** have known baseline CKD. She is *** on an ACEI/ARB at present.  3) Lipids: Patient is *** on a statin.    4) Hypertension: ***   at goal of < 140/90 mmHg.       Signed electronically by: Stefano Redgie Butts, MD  [facility]  [facility] [address] [place], [place] [address] Phone: [phone] FAX: [phone]   CC: Vernadine Charlie ORN, MD [address] [place] [address] Phone: [phone]  Fax: [phone]    Return to Endocrinology clinic as below: Future Appointments  Date Time Provider Department Center  03/20/2024  2:00 PM Aadvika Konen, Donell Redgie, MD [facility] None  03/26/2024  9:15 AM [facility] PHLEBOTOMIST [facility] None  03/26/2024  9:40 AM Cloretta Arley NOVAK, MD [facility] None  03/27/2024  8:00 AM [facility] INFUSION [facility] None  03/29/2024  2:00 PM [facility] INFUSION [facility] None

## 2024-03-20 NOTE — Patient Instructions (Signed)

## 2024-03-23 ENCOUNTER — Other Ambulatory Visit: Payer: Self-pay | Admitting: Oncology

## 2024-03-23 DIAGNOSIS — C259 Malignant neoplasm of pancreas, unspecified: Secondary | ICD-10-CM

## 2024-03-26 ENCOUNTER — Other Ambulatory Visit

## 2024-03-26 ENCOUNTER — Inpatient Hospital Stay (HOSPITAL_BASED_OUTPATIENT_CLINIC_OR_DEPARTMENT_OTHER): Admitting: Oncology

## 2024-03-26 ENCOUNTER — Inpatient Hospital Stay

## 2024-03-26 ENCOUNTER — Inpatient Hospital Stay: Attending: Hematology and Oncology

## 2024-03-26 VITALS — BP 96/75 | HR 78 | Temp 97.8°F | Resp 18 | Ht 65.0 in | Wt 122.4 lb

## 2024-03-26 DIAGNOSIS — R109 Unspecified abdominal pain: Secondary | ICD-10-CM | POA: Insufficient documentation

## 2024-03-26 DIAGNOSIS — Z5111 Encounter for antineoplastic chemotherapy: Secondary | ICD-10-CM | POA: Insufficient documentation

## 2024-03-26 DIAGNOSIS — Z23 Encounter for immunization: Secondary | ICD-10-CM | POA: Insufficient documentation

## 2024-03-26 DIAGNOSIS — C259 Malignant neoplasm of pancreas, unspecified: Secondary | ICD-10-CM

## 2024-03-26 DIAGNOSIS — R197 Diarrhea, unspecified: Secondary | ICD-10-CM | POA: Insufficient documentation

## 2024-03-26 DIAGNOSIS — C250 Malignant neoplasm of head of pancreas: Secondary | ICD-10-CM | POA: Diagnosis present

## 2024-03-26 DIAGNOSIS — R112 Nausea with vomiting, unspecified: Secondary | ICD-10-CM | POA: Diagnosis not present

## 2024-03-26 DIAGNOSIS — G620 Drug-induced polyneuropathy: Secondary | ICD-10-CM | POA: Diagnosis not present

## 2024-03-26 LAB — CMP (CANCER CENTER ONLY)
ALT: 24 U/L (ref 0–44)
AST: 30 U/L (ref 15–41)
Albumin: 4.1 g/dL (ref 3.5–5.0)
Alkaline Phosphatase: 193 U/L — ABNORMAL HIGH (ref 38–126)
Anion gap: 8 (ref 5–15)
BUN: 14 mg/dL (ref 8–23)
CO2: 27 mmol/L (ref 22–32)
Calcium: 9.3 mg/dL (ref 8.9–10.3)
Chloride: 104 mmol/L (ref 98–111)
Creatinine: 0.65 mg/dL (ref 0.44–1.00)
GFR, Estimated: >60 mL/min (ref >60)
Glucose, Bld: 104 mg/dL — ABNORMAL HIGH (ref 70–99)
Potassium: 4.3 mmol/L (ref 3.5–5.1)
Sodium: 139 mmol/L (ref 135–145)
Total Bilirubin: 0.3 mg/dL (ref 0.0–1.2)
Total Protein: 6.7 g/dL (ref 6.5–8.1)

## 2024-03-26 LAB — CBC WITH DIFFERENTIAL (CANCER CENTER ONLY)
Abs Immature Granulocytes: 0.08 K/uL — ABNORMAL HIGH (ref 0.00–0.07)
Basophils Absolute: 0.1 K/uL (ref 0.0–0.1)
Basophils Relative: 1 %
Eosinophils Absolute: 0.3 K/uL (ref 0.0–0.5)
Eosinophils Relative: 4 %
HCT: 38.1 % (ref 36.0–46.0)
Hemoglobin: 12.3 g/dL (ref 12.0–15.0)
Immature Granulocytes: 1 %
Lymphocytes Relative: 30 %
Lymphs Abs: 2.5 K/uL (ref 0.7–4.0)
MCH: 32.6 pg (ref 26.0–34.0)
MCHC: 32.3 g/dL (ref 30.0–36.0)
MCV: 101.1 fL — ABNORMAL HIGH (ref 80.0–100.0)
Monocytes Absolute: 1.1 K/uL — ABNORMAL HIGH (ref 0.1–1.0)
Monocytes Relative: 13 %
Neutro Abs: 4.4 K/uL (ref 1.7–7.7)
Neutrophils Relative %: 51 %
Platelet Count: 322 K/uL (ref 150–400)
RBC: 3.77 MIL/uL — ABNORMAL LOW (ref 3.87–5.11)
RDW: 14.1 % (ref 11.5–15.5)
WBC Count: 8.4 K/uL (ref 4.0–10.5)
nRBC: 0.5 % — ABNORMAL HIGH (ref 0.0–0.2)

## 2024-03-26 MED ORDER — INFLUENZA VIRUS VACC SPLIT PF (FLUZONE) 0.5 ML IM SUSY
0.5000 mL | PREFILLED_SYRINGE | Freq: Once | INTRAMUSCULAR | Status: AC
  Administered 2024-03-26: 0.5 mL via INTRAMUSCULAR
  Filled 2024-03-26: qty 0.5

## 2024-03-26 NOTE — Progress Notes (Signed)
 Alyssa Harper   Diagnosis: Pancreas cancer  INTERVAL HISTORY:   Alyssa Harper complete another cycle of chemotherapy 03/13/2024.  No nausea or diarrhea.  Her lips felt burned .  She reports a cramp of the right hand following chemotherapy.  She has intermittent tingling in the fingers.  She had neck pain following G-CSF. Alyssa Harper is scheduled for follow-up at [facility] on 04/10/2024.  Objective:  Vital signs in last 24 hours:  Blood pressure 96/75, pulse 78, temperature 97.8 F (36.6 C), temperature source Temporal, resp. rate 18, height 5' 5 (1.651 m), weight 122 lb 6.4 oz (55.5 kg), SpO2 98%.    HEENT: No thrush or ulcers Resp: Lungs clear bilaterally Cardio: Regular rate and rhythm GI: No hepatosplenomegaly, nontender Vascular: No leg edema Neuro: Mild loss of vibratory sense at the fingertips bilaterally  Portacath/PICC-without erythema  Lab Results:  Lab Results  Component Value Date   WBC 8.4 03/26/2024   HGB 12.3 03/26/2024   HCT 38.1 03/26/2024   MCV 101.1 (H) 03/26/2024   PLT 322 03/26/2024   NEUTROABS 4.4 03/26/2024    CMP  Lab Results  Component Value Date   NA 140 03/12/2024   K 4.7 03/12/2024   CL 104 03/12/2024   CO2 27 03/12/2024   GLUCOSE 134 (H) 03/12/2024   BUN 11 03/12/2024   CREATININE 0.71 03/12/2024   CALCIUM  9.5 03/12/2024   PROT 6.7 03/12/2024   ALBUMIN 4.2 03/12/2024   AST 32 03/12/2024   ALT 27 03/12/2024   ALKPHOS 190 (H) 03/12/2024   BILITOT 0.3 03/12/2024   GFRNONAA >60 03/12/2024   GFRAA >60 09/13/2018    Lab Results  Component Value Date   RJW800 44 (H) 03/12/2024    Medications: I have reviewed the patient's current medications.   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-FNA of pancreas body lesion: Adenocarcinoma 07/22/2018 distal pancreatectomy and splenectomy, adenocarcinoma with mucinous component, moderately differentiated, 0/16 nodes, negative  margins,pT1pN0 Adjuvant FOLFIRINOX completed 02/22/2019 10/13/2023 CT Abdo/pelvis: Distended stomach, no evidence of recurrent tumor or distant metastatic disease 10/18/2023 EGD with stricture, status post a gastrojejunostomy 10/24/2023: Firm mass involving the head of the pancreas and duodenal bulb, serosal implant biopsy positive for adenocarcinoma-moderately differentiated, foundation 1-BRCA2 rearrangement, KRAS G 12V 11/08/2023 CTs at [facility]: Unchanged tumor in the anterior pancreas neck inseparable from the duodenal bulb, new hepatic segment 4 subcapsular lesion increased size and conspicuity of subcentimeter peripancreatic/anterior central mesenteric nodes 11/25/2023: Cycle 1 NALIRIFOX 12/13/2023: Cycle 2 NALIRIFOX, oxaliplatin  further dose reduced and irinotecan held, G-CSF added 01/03/2024: Cycle 3 NALIRIFOX, irinotecan held, G -CSF 01/17/2024: Cycle 4 NALIRIFOX, irinotecan held, G-CSF 01/27/2024 CT[facility] at [facility]: Decrease of the ill-defined mass between the anterior pancreas head/neck and posterior duodenal bulb, previous hypodense posterior segment 4 lesion not visualized 02/14/2024: Cycle 5 NALIRIFOX, irinotecan was held, G-CSF 02/28/2024: Cycle 6 NALIRIFOX, irinotecan held, G-CSF 03/13/2024: Cycle 7 NALIRIFOX, irinotecan held, G-CSF 03/26/2024: Cycle 8 NALIRIFOX, irinotecan held, G-CSF  2.   Admission 11/29/2023 with intractable nausea/vomiting 3.   Persistent nausea, diarrhea, and cramping abdominal pain following cycle 1 NALIRIFOX 4.   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   01/05/2024 germline CancerNext expanded panel: Negative        Disposition:Alyssa Harper has pancreas cancer.  She has completed 7 cycles of salvage therapy with dose adjusted NALIRIFOX.  She was unable to tolerate liposomal irinotecan.  She will complete a cycle of neoadjuvant NALIRIFOX  today.  She is scheduled for radiation simulation at [facility] on 04/10/2024 with the plan to  begin concurrent capecitabine and radiation in early November.  She will return for an office visit here to the radiation.  We reviewed potential toxicities associated with capecitabine including the chance of mucositis, diarrhea, hand/foot syndrome, rash, and hyperpigmentation.  She agrees to proceed.  I will coordinate dosing of the capecitabine with Dr. Dasie. We will hold G-CSF with this cycle of chemotherapy.  Irinotecan remains on hold.  Arley Hof, MD  03/26/2024  10:06 AM

## 2024-03-27 ENCOUNTER — Ambulatory Visit

## 2024-03-27 ENCOUNTER — Inpatient Hospital Stay

## 2024-03-27 VITALS — HR 77 | Temp 98.0°F | Resp 16

## 2024-03-27 DIAGNOSIS — Z5111 Encounter for antineoplastic chemotherapy: Secondary | ICD-10-CM | POA: Diagnosis not present

## 2024-03-27 DIAGNOSIS — C259 Malignant neoplasm of pancreas, unspecified: Secondary | ICD-10-CM

## 2024-03-27 LAB — CANCER ANTIGEN 19-9: CA 19-9: 42 U/mL — ABNORMAL HIGH (ref 0–35)

## 2024-03-27 MED ORDER — DEXAMETHASONE 4 MG PO TABS
8.0000 mg | ORAL_TABLET | Freq: Once | ORAL | Status: AC
  Administered 2024-03-27: 8 mg via ORAL
  Filled 2024-03-27: qty 2

## 2024-03-27 MED ORDER — DIPHENHYDRAMINE HCL 25 MG PO CAPS
25.0000 mg | ORAL_CAPSULE | Freq: Once | ORAL | Status: AC
  Administered 2024-03-27: 25 mg via ORAL
  Filled 2024-03-27: qty 1

## 2024-03-27 MED ORDER — DEXTROSE 5 % IV SOLN: INTRAVENOUS | Status: AC

## 2024-03-27 MED ORDER — SODIUM CHLORIDE 0.9 % IV SOLN
1200.0000 mg/m2 | INTRAVENOUS | Status: DC
  Administered 2024-03-27: 2000 mg via INTRAVENOUS
  Filled 2024-03-27: qty 40

## 2024-03-27 MED ORDER — SODIUM CHLORIDE 0.9 % IV SOLN
150.0000 mg | Freq: Once | INTRAVENOUS | Status: AC
  Administered 2024-03-27: 150 mg via INTRAVENOUS
  Filled 2024-03-27: qty 150

## 2024-03-27 MED ORDER — OXALIPLATIN CHEMO INJECTION 100 MG/20ML
42.5000 mg/m2 | Freq: Once | INTRAVENOUS | Status: AC
  Administered 2024-03-27: 65 mg via INTRAVENOUS
  Filled 2024-03-27: qty 13

## 2024-03-27 MED ORDER — PALONOSETRON HCL INJECTION 0.25 MG/5ML
0.2500 mg | Freq: Once | INTRAVENOUS | Status: AC
  Administered 2024-03-27: 0.25 mg via INTRAVENOUS
  Filled 2024-03-27: qty 5

## 2024-03-27 MED ORDER — FAMOTIDINE IN NACL 20-0.9 MG/50ML-% IV SOLN
20.0000 mg | Freq: Once | INTRAVENOUS | Status: AC
  Administered 2024-03-27: 20 mg via INTRAVENOUS
  Filled 2024-03-27: qty 50

## 2024-03-27 MED ORDER — SODIUM CHLORIDE 0.9 % IV SOLN: INTRAVENOUS | Status: DC

## 2024-03-27 NOTE — Patient Instructions (Signed)
 [facility] - A DEPT OF [facility]  Discharge Instructions: Thank you for choosing [facility] to provide your oncology and hematology care.   If you have a lab appointment with the Cancer Center, please go directly to the Cancer Center and check in at the registration area.   Wear comfortable clothing and clothing appropriate for easy access to any Portacath or PICC line.   We strive to give you quality time with your provider. You may need to reschedule your appointment if you arrive late (15 or more minutes).  Arriving late affects you and other patients whose appointments are after yours.  Also, if you miss three or more appointments without notifying the office, you may be dismissed from the clinic at the provider's discretion.      For prescription refill requests, have your pharmacy contact our office and allow 72 hours for refills to be completed.    Today you received the following chemotherapy and/or immunotherapy agents: oxaliplatin  and fluorouracil        To help prevent nausea and vomiting after your treatment, we encourage you to take your nausea medication as directed.  BELOW ARE SYMPTOMS THAT SHOULD BE REPORTED IMMEDIATELY: *FEVER GREATER THAN 100.4 F (38 C) OR HIGHER *CHILLS OR SWEATING *NAUSEA AND VOMITING THAT IS NOT CONTROLLED WITH YOUR NAUSEA MEDICATION *UNUSUAL SHORTNESS OF BREATH *UNUSUAL BRUISING OR BLEEDING *URINARY PROBLEMS (pain or burning when urinating, or frequent urination) *BOWEL PROBLEMS (unusual diarrhea, constipation, pain near the anus) TENDERNESS IN MOUTH AND THROAT WITH OR WITHOUT PRESENCE OF ULCERS (sore throat, sores in mouth, or a toothache) UNUSUAL RASH, SWELLING OR PAIN  UNUSUAL VAGINAL DISCHARGE OR ITCHING   Items with * indicate a potential emergency and should be followed up as soon as possible or go to the Emergency Department if any problems should occur.  Please show the CHEMOTHERAPY ALERT  CARD or IMMUNOTHERAPY ALERT CARD at check-in to the Emergency Department and triage nurse.  Should you have questions after your visit or need to cancel or reschedule your appointment, please contact [facility] - A DEPT OF [facility]  Dept: [phone]  and follow the prompts.  Office hours are 8:00 a.m. to 4:30 p.m. Monday - Friday. Please note that voicemails left after 4:00 p.m. may not be returned until the following business day.  We are closed weekends and major holidays. You have access to a nurse at all times for urgent questions. Please call the main number to the clinic Dept: [phone] and follow the prompts.   For any non-urgent questions, you may also contact your provider using MyChart. We now offer e-Visits for anyone 61 and older to request care online for non-urgent symptoms. For details visit [email or web address].   Also download the MyChart app! Go to the app store, search MyChart, open the app, select [place], and log in with your MyChart username and password.

## 2024-03-29 ENCOUNTER — Inpatient Hospital Stay

## 2024-03-29 DIAGNOSIS — C259 Malignant neoplasm of pancreas, unspecified: Secondary | ICD-10-CM

## 2024-03-29 DIAGNOSIS — Z5111 Encounter for antineoplastic chemotherapy: Secondary | ICD-10-CM | POA: Diagnosis not present

## 2024-03-29 MED ORDER — SODIUM CHLORIDE 0.9 % IV SOLN: INTRAVENOUS | Status: DC

## 2024-03-30 ENCOUNTER — Telehealth: Payer: Self-pay | Admitting: *Deleted

## 2024-03-30 NOTE — Telephone Encounter (Signed)
 Dr. Cloretta spoke with Dr. Dasie at [facility] and was told that Dr. Dasie will prescribe the Xeloda for.

## 2024-04-03 NOTE — Discharge Summary (Signed)
 Medical Oncology Discharge Summary  Admit Date: 04/02/2024 Discharge Date: 04/03/2024 Admitting Physician: No admitting provider for patient encounter.  Discharge Physician: John, Joshuva, DO  Primary Care Provider: Sheilda Lorrayne Norris, MD, Phone [phone] Primary Oncologist: Sheilda Lorrayne Norris, MD   Discharge Location: Home   Results Pending at Discharge:  None  Please see phone numbers at end of this summary for lab contact information.   Follow-up Tests/Recommendations:  None   Anticipatory Guidance for Follow-up Providers: Patient diagnosed with large right main pulmonary embolus.  ECHO - WNL,   Sent home on Eliquis 10 mg BID for 7 day's followed by 5 mg BID    Follow-up/Care Transition Plan Will F/U with Dr. Sheilda after surgery/XRT Future Appointments  Date Time Provider Department Center  09/18/2024  9:00 AM [facility] 1A BNE DEN BONE DENSITY [facility]  09/18/2024 10:40 AM Allen, Debby PARAS, MD ENDOCR[facility]    [facility] Provider Follow-up (if any): Follow up at [facility] with Dr. Esau Furnace as scheduled   Admission Diagnoses:  Shortness of breath [R06.02]  Discharge Diagnoses:  Active Problems:   * No active hospital problems. * Resolved Problems:   * No resolved hospital problems. *    Consult Orders: IP CONSULT TO ONCOLOGY ADMISSIONS-SOLID TUMOR   Surgeries and Procedures Performed: None     Brief History of Present Illness: Alyssa Harper is a 63 y.o. female with a PMHx pertinent for pancreatic cancer s/p distal pancreatectomy / splenectomy on 07/22/18 who presented to the ED after having restaging scans done at f/u with Dr. Sheilda that showed large PE right main PA with no evidence of RHS   Hospital Course:    #Intermediate-Low Risk Acute PE  Pt presented to [facility] and underwent restaging CT scan that showed  Reassuringly her O2 sats remain stable on RA and she is HDS with no tachycardia. Additionally, she  has no evidence of RHS on CT and her BNP and trops were both wnl.  - started on heparin  gtt in ED, continue - start Eliquis in PM at home  - TTE- WNL   #Pancreatic Cancer Pt of Dr. Sheilda who sent her to ED after discovering PE on restaging CT. Currently on Folfox last received on 10/09. - Dr. Sheilda aware of admission and visited patient at bedside    #GERD - continue PPI - continue pepcid      Status on Discharge:  Cognitive: A & O x's 4 Functional (ADLS/Mobility): iADL's  Current Activity: Walks frequently (04/03/24 1100) Current Mobility: No limitation (04/03/24 1100)  Code Status: Full Code  Goals of Care Discussion: Code status was addressed but did not change during this encounter.  Discharge Exam:  BP 107/68 (BP Location: Right upper arm, Patient Position: Lying)   Pulse 69   Temp 36.5 C (97.7 F) (Oral)   Resp 16   Ht 166.4 cm (5' 5.5)   Wt 55.7 kg (122 lb 14.4 oz)   SpO2 93%   BMI 20.14 kg/m   Physical Exam: General - NAD, resting comfortably HEENT - OP clear w/o lesions CV - RRR, normal S1/S2, Lungs - Good airway movement, CTA b/l, No wheezes, rales, or rhonchi Abd - Soft, no obvious masses, ND, No TTP Ext - Warm, well-perfused, no LE edema  Other Data: Pertinent Lab Testing: Recent Labs  Lab 04/02/24 1047 04/02/24 1625 04/03/24 0414  NA 135 134*  134* 138  K 3.9 4.3 4.0  CL 99 97* 102  CO2 26 25 23  BUN 12 12 10   CREATININE 0.6 0.7 0.6  GLUCOSE 116 107 147*  CALCIUM  8.7 8.8 8.2*   Recent Labs  Lab 04/02/24 1047 04/02/24 1625 04/03/24 0414  AST 19 20 19   ALT 16 18 13   ALKPHOS 108 114* 89  TBILI 0.6 0.5 0.4     Recent Labs  Lab 04/02/24 1047 04/02/24 1625 04/03/24 0414  WBC 9.3 11.6* 8.6  HGB 12.1 12.8 11.0*  HCT 36.9 38.1 33.3*  PLT 432 474* 442   Recent Labs  Lab 04/02/24 1625 04/02/24 2253 04/03/24 0414  APTT 26.9   < > 85.5*  INR 1.0  --   --    < > = values in this interval not displayed.        Pertinent  Imaging:  Request for image library services Result Date: 04/03/2024 Please refer to the appropriate PACS to view images.   Echo complete Result Date: 04/03/2024               [facility]                         TYSHANA NISHIDA                            [facility]                              [id number]                                                                             DOB: May 26, 1961  Age: 62                  ECHO-DOPPLER REPORT                             Date: 04/03/2024                                                                                Female  Observation                                                                     LOCATION: [facility]                                                                            MD1: ALEJANDRO DE LA TORRE                                                                     GARCIA                       STUDY: ECHO COMPLETE                             SOUND QLTY: Poor                          ECHO: Yes                                           STRAIN: Yes                          COLOR: Yes                                               3D: Yes                        DOPPLER: Yes                                               BP: 107 / 68                 RV BIOPSY: No                                                HR: 73 BPM                    CONTRAST: No  Height: 65 in                       MEDIUM: N/A                                           Weight: 121 lbs                    MACHINE: CDU - E95-16                                     BSA: 1.6                   ------------------------------------------------------------------------------------------     History: Pulmonary Embolus      Reason: RV Function  LV function  Indication: R06.02- Shortness of breath.                                                        CONCLUSION ------------------------------------------------------------------------------- NORMAL LEFT VENTRICULAR SYSTOLIC FUNCTION WITH NO LVH ESTIMATED EF: >55%, CALC EF(3D): 74% NORMAL LA PRESSURES WITH NORMAL DIASTOLIC FUNCTION NORMAL RIGHT VENTRICULAR SYSTOLIC FUNCTION VALVULAR REGURGITATION: No AR, No MR, No PR, TRIVIAL TR                                  NO VALVULAR STENOSIS                                                                      NO PRIOR ECHO FOR COMPARISON                                                                                          ECHOCARDIOGRAPHIC DESCRIPTIONS ----------------------------------------------------------- AORTIC ROOT         Asc Ao Size: Normal                               Dissection: INDETERMINATE FOR DISSECTION                                             AORTIC VALVE            Leaflets: Tricuspid  Mobility: Fully Mobile                    Morphology: Normal                                                                                    AR: No AR                                         AS: No AS                              AV Mass: No Masses                   LEFT VENTRICLE                Size: Normal                                                                                   LVH: None                                Contraction: Normal                               Closest EF: >55%                                    Calc. EF: 74%(3D)                        LV GLS (GE): -24.4%   Normal Range <-18%     Strain Analysis: GLS < -18% Global longitudinal strain is normal.                                   LV Mass: No Masses                         Dias. FxClass: Normal                                                                   WALL MOTION  Basal             Mid               Apical              Anterior  Septum: Normal            Normal            Normal                Anterior Wall: Normal            Normal            Normal                 Lateral Wall: Normal            Normal            Normal               Posterior Wall: Normal            Normal                                  Inferior Wall: Normal            Normal            Normal              Inferior Septum: Normal            Normal                            Rest Rest Score Index: 1.00 MITRAL VALVE            Leaflets: Normal                                  Mobility: Fully Mobile                    Morphology: Normal                                                                                     MR: No MR                                         MS: No MS                            MV masses: No Masses                   LEFT ATRIUM                Size: Normal  LA masses: No Masses                   MAIN PA                Size: Not Seen                    PULMONIC VALVE            Leaflets: UNKNOWN                                 Mobility: Fully Mobile                    Morphology: Normal                                                           PR: No PR                                         PS: No PS                            PV masses: No Masses                   RIGHT VENTRICLE                Size: Normal                                 Free Wall: Normal                         Contraction: Normal                                                       RV GLS: 15.3%                                                         TAPSE: 2.2 cm                                                    RV masses: No Masses                   TRICUSPID VALVE            Leaflets: Normal  Mobility: Fully Mobile                    Morphology: Normal                                       TR: TRIVIAL TR                                    TS: No TS                             TV masses: No Masses                   RIGHT ATRIUM                Size: SMALL                                 RA masses: No Masses                   PERICARDIUM               Fluid: No Effusion        INFERIOR VENA CAVA                Size: Normal                                 Max Diam: 1.5 cm                                  Min Diam: 0.2 cm                      Percent Change: 85 %                                                                       Resp.Collapse: Normal Respiratory Collapse                                          RESTING ECHOCARDIOGRAPHIC MEASUREMENTS --------------------------------------------------- AORTA Measurements            Values    Units     Normal Range                              Aorta Sin: 2.6       cm        [2.4 - 3.6]                               Asc.Aorta: 3  cm        [1.9 - 3.5]                          Asc. Aorta BSA: 1.8       cm/m2     [1.0 - 2.2]                  LEFT VENTRICLE                  LVIDd: 4.5       cm        [3.8 - 5.2]                                   LVIDs: 3.2       cm        [2.2 - 3.5]                               LVIDd/BSA: 2.8       cm/m2                                                     SWT: 0.7       cm        [0.6 - 0.9]                                     PWT: 0.7       cm        [0.6 - 0.9]                                LV EF 3D: 74        %                                                   LV EDV 3D: 51        mL                                                 LV EDV 3Di: 32        mL/m2                                               LV ESV 3D: 13        mL                                                 LV ESV  3Di: 8         mL/m2                                  DIASTOLIC FUNCTION          MV Pk. E Vel.: 68.8      cm/s                                            MV Pk. A Vel.: 70.9      cm/s                                                   MV E/A: 1                                                                 MV DT:  231       msec                                           MV Med E' Vel.: 7.7       cm/s                                              MV Med E/e': 8.9                                                       MV Lat E'Vel.: 6.2       cm/s                                              MV Lat E/e': 11.1                                                        MV Avg E/e': 10        cm/s                                   LEFT ATRIUM                LA Diam: 2.6       cm        [  2.7 - 3.8]                                 LA Area: 15        cm2       [<= 20]                                   LA Volume: 36        ml        [18 - 58]                                      LAVi: 22        ml/m2     [16 - 34]                    RIGHT VENTRICLE               RV TAPSE: 2.2       cm                                                 RV S' Vel.: 13.8      cm/s                                                  RV Base: 2.3       cm        [2.5 - 4.1]                                  RV Mid: 2         cm        [1.9 - 3.5]                  RIGHT ATRIUM                RA Area: 10.6      cm2       [ <= 20]                                  RA Volume: 21        mL                                                       RAVi: 13        mL/m2     [15 - 27]                    INFERIOR VENA CAVA                Max.IVC: 1.5  cm        [ <= 2.1]                                   Min.IVC: 0.2       cm        [ <= 1.7]                            Percent Change: 85        %                                      Pressures, Gradients, and DOPPLER ECHO --------------------------------------------------- Mitral Valve          MV Pk. E Vel.: 68.8      cm/s                                            MV Pk. A Vel.: 70.9      cm/s                                                   MV E/A: 1                                                      MV Inflow E Vel.: 68.8      cm/s                                        MV Annulus E'Vel.: 7.7       cm/s                                                 E/E'Ratio: 9                                                Pulmonic Valve                  PV AT: 134       msec                                   Tricuspid Regurgitation Values            RA Pressure: 3         mmHg  3D acquisition and reconstructions were performed as part of this examination to more accurately quantify the effects of identified structural abnormalities as part of the exam with independent workstation.        Perform By: Susana Urena                                                             Res. Person: Susana Urena                                                       Electronically signed by Eleanor Arlean Pump, M.D. on:04/03/2024 11:10:57 AM with status of Final The images are stored in the [facility] system, please contact the clinical provider for images related to this study.                                                                      CT dual pancreas incl CT abd pel w and CT chest w MIPS Result Date: 04/02/2024 Procedure: CT Chest with IV Contrast Procedure: CT Abdomen and Pelvis with IV Contrast Comparison:  January 27, 2024. Indication:  Pancreatic cancer, monitor, C25.1 Malignant neoplasm of body of pancreas (CMS/HHS-HCC). Technique:  CT imaging of the chest, abdomen, and pelvis was performed with IV contrast. A pancreatic protocol CT was performed, with pancreatic arterial phase imaging of the abdomen and portal venous phase imaging of the chest, abdomen, and pelvis.   Iodinated contrast was used due to the indications for the examination, to improve disease detection and to further define anatomy. Coronal and sagittal reformatted images were generated and reviewed. 3-D maximal intensity projection (MIP) reconstructions of the chest were created and reviewed to potentially increase study sensitivity. Findings: Chest:  - Chest wall and Thoracic Inlet: No masses or lymphadenopathy. Right chest wall port with catheter  tip in the right atrium. Bilateral breast implants. - Mediastinum and Hila: No masses or lymphadenopathy. - Thoracic Vessels: Large pulmonary embolus within the right main pulmonary artery extending into the right middle lobe and right lower lobe branches. No evidence of right heart strain. - Heart and Pericardium: Normal heart size.  No pericardial effusion. - Lungs and Airways: No suspicious nodules or opacities. Right middle lobe and left lower lobe atelectasis. - Pleura: No pleural effusions. Abdomen and pelvis: - Liver: Normal in morphology and enhancement.  No suspicious hepatic masses are identified.  The portal and hepatic veins are patent. - Biliary and Gallbladder: No intrahepatic or extrahepatic bile duct dilatation. Unremarkable gallbladder. - Spleen: Surgically absent. - Pancreas: Stable ill-defined hypoattenuating soft tissue between the anterior aspect of the pancreatic head/neck and distal stomach measuring 1.5-1.0 cm on series 5 image 50, unchanged when remeasured at a comparable level on the prior study. Distal pancreatectomy. - Adrenal Glands: Normal in appearance. - Kidneys: No suspicious renal lesions. No hydronephrosis. - Abdominal and Pelvic Vasculature: No abdominal aortic aneurysm. -  Gastrointestinal Tract: No abnormal dilation or wall thickening. - Peritoneum/Mesentery/Retroperitoneum: No free fluid.  No free intraperitoneal air. - Lymph Nodes: No retroperitoneal or mesenteric lymphadenopathy.  - Bladder: Normal in appearance. - Pelvic Organs: Unremarkable. - Body Wall: Unremarkable. - Musculoskeletal:  No aggressive appearing osseous lesions. Impression: Large pulmonary embolus within the right main pulmonary artery extending into the right middle lobe and right lower lobe branches. No evidence of right heart strain. These findings were relayed to Dr. Sheilda at 12:10 PM on April 02, 2024. Stable ill-defined hypoattenuating soft tissue between the anterior aspect of the pancreatic  head/neck and distal stomach. Electronically Signed by:  Iris CINDERELLA Friends, MD, [facility] Electronically Signed on:  04/02/2024 12:13 PM    Allergies: Allergies  Allergen Reactions  . Adhesive Dermatitis    IV3000 DRESSING FOR PORT ACCESS - Tegaderm cause irritation to the surrounding skin   . Bee Pollen Other (See Comments)    Hayfever  . Penicillin Rash     Patient Instructions:    Current Discharge Medication List     START taking these medications      Instructions  apixaban 5 mg tablet Quantity: 70 tablet Refills: 0 Start taking on: April 03, 2024  Commonly known as: ELIQUIS Take 2 tablets (10 mg total) by mouth every 12 (twelve) hours for 7 days, THEN 1 tablet (5 mg total) every 12 (twelve) hours for 21 days.   neomycin-polymyxin-dexAMETHasone  ophthalmic ointment Refills: 0  Commonly known as: MAXITROL 2 (two) times daily       CONTINUE taking these medications      Instructions  acetaminophen  500 mg capsule Refills: 0  Commonly known as: TYLENOL  Take 500 mg by mouth Last time this was given: Ask your nurse or doctor   ALPRAZolam  0.25 MG tablet Quantity: 90 tablet Refills: 0  Commonly known as: XANAX  Take 1 tablet (0.25 mg total) by mouth 3 (three) times daily as needed for Sleep   CLARITIN ORAL Refills: 0  Take by mouth once a week   cyanocobalamin 1000 MCG tablet Quantity: 30 tablet Refills: 11  Commonly known as: VITAMIN B12 Take 1 tablet (1,000 mcg total) by mouth once daily   dexAMETHasone  4 MG tablet Quantity: 50 tablet Refills: 1  Commonly known as: DECADRON  Take 8mg  (2 x 4mg  tablets) by mouth in the morning for 2 days after chemotherapy, then as directed.   dicyclomine 10 mg capsule Quantity: 120 capsule Refills: 11  Commonly known as: BENTYL Take 1 capsule (10 mg total) by mouth 4 (four) times daily before meals and nightly   LORazepam  0.5 MG tablet Refills: 0  Commonly known as: ATIVAN  Take 0.5 mg by mouth    ondansetron  8 MG tablet Quantity: 30 tablet Refills: 2  Commonly known as: ZOFRAN  Take 1 tablet every 8 hours as needed for nausea or vomiting.   OXYCODONE -ASPIRIN ORAL Refills: 0  Take by mouth   PANTOPRAZOLE  ORAL Refills: 0  Take by mouth Last time this was given: 40 mg on April 02, 2024 11:10 PM   promethazine  12.5 MG tablet Quantity: 30 tablet Refills: 2  Commonly known as: PHENERGAN  Take 1 tablet (12.5 mg total) by mouth every 6 (six) hours as needed for Nausea   WOMEN'S 50 PLUS MULTIVITAMIN ORAL Refills: 0  Take by mouth         Activity Recommendation: activity as tolerated  Other Discharge Instructions: Services Setup at Discharge [facility] health, Nursing, Infusion, PT/OT):  none Wound Care: none needed Tubes/Lines at  Discharge: none Behavioral Issues During Hospital Stay (SNF DC only): none  Diet (including Supplements/Tube Feeds): Active Orders  Diet   Diet regular       For pending tests please use the following [facility] numbers:  Laboratory: [phone] Microbiology: [phone] Pathology: [phone] Radiology: [phone]  For questions about this hospital stay, please contact [facility] [phone])  Time spent: 30 minutes  MICHELLE ANN MINTZ, NP  04/03/2024   ------------------------------------------------------------------------------- Attestation signed by Alyssa Holter, DO at 04/03/2024 12:20 PM Attestation: I personally saw and evaluated the patient. I reviewed the APP's/Fellow/Resident plan and agree with the findings and plan as documented, additional details will be listed below.   Shifa Langan is a 63 year old female with a past medical history significant for pancreatic adenocarcinoma status post distal pancreatectomy and splenectomy on 07/22/2018, currently receiving FOLFOX chemotherapy (last dose 10/09), who presented to the emergency department after restaging imaging performed by Dr. Sheilda revealed a  large PE in the right main pulmonary artery without evidence of right heart strain.   She was hemodynamically stable on presentation, with normal oxygen saturation on room air, no tachycardia, and reassuring cardiac biomarkers (normal BNP and troponin levels). She was started on a heparin  infusion in the ED and transitioned to Eliquis to begin at home. TTE with no signs of right heart strain.  Stable for discharge with appropriate follow up.   Joshuva John, DO  -------------------------------------------------------------------------------

## 2024-04-05 ENCOUNTER — Other Ambulatory Visit: Payer: Self-pay | Admitting: Internal Medicine

## 2024-04-05 ENCOUNTER — Encounter: Attending: Internal Medicine | Admitting: Dietician

## 2024-04-05 ENCOUNTER — Encounter: Payer: Self-pay | Admitting: Dietician

## 2024-04-05 VITALS — Ht 65.0 in | Wt 122.0 lb

## 2024-04-05 DIAGNOSIS — E1369 Other specified diabetes mellitus with other specified complication: Secondary | ICD-10-CM | POA: Diagnosis present

## 2024-04-05 DIAGNOSIS — K869 Disease of pancreas, unspecified: Secondary | ICD-10-CM | POA: Diagnosis present

## 2024-04-05 MED ORDER — ONETOUCH VERIO VI STRP: 1.0000 | ORAL_STRIP | Freq: Every day | 12 refills | Status: AC

## 2024-04-05 NOTE — Progress Notes (Signed)
 Diabetes Self-Management Education  Visit Type: First/Initial  Appt. Start Time: 0850 Appt. End Time: 0930  04/06/2024  Alyssa Harper, identified by name and date of birth, is a 63 y.o. 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   ASSESSMENT Patient is here today alone.  Blood glucose kit for the One Touch Verio Reflect was provided to patient with instructions on how to use it.  Blood glucose 103 in office after a meal. Lot:  [id number] x Expiration 02/19/2028  Pancreatic cancer 2020 and tail of the pancrease has been removed.  Cancer has returned.  s/p Chemo and now with PE.  Total pancreatectomy and whipple next month (05/14/2024 at [facility]). 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 Other history includes Sjogren's disease, HLD, MVP. She complains of a headache that has lasted several days. She states that she does not drink enough water as it makes her stomach hurt and is unappetizingly.  Discussed to discuss her headache with her MD.  She needs to have her vitamin A, D, E, K, and vitamin B-12 monitored post op.  Medications include:  Creon  before meals but not snacks, Eliquis Labs: Glucose 104 on 03/26/2024  65 122 lbs 03/26/2024 120 UBW  Patient lives with her husband.  She does the shopping and cooking.  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 Her husband does not like much meat but she has been eating more meat herself. Likes:  Cheerios, blueberries, 2% milk, OJ, hot black tea with sweetened creamer for breakfast and snacks usually include:  crackers and PB OR nuts OR yogurt and fruit   Height 5' 5 (1.651 m), weight 122 lb (55.3 kg). Body mass index is 20.3 kg/m.   Diabetes Self-Management Education - 04/06/24 1310       Visit Information   Visit Type First/Initial      Initial Visit   Diabetes Type --   Will have Type 3 diabetes after removal of pncrease.    Are you currently following a meal plan? No    Are you taking your medications as prescribed? Not on Medications      Health Coping   How would you rate your overall health? Fair      Psychosocial Assessment   Patient Belief/Attitude about Diabetes Motivated to manage diabetes    Self-care barriers None    Self-management support Doctor's office;CDE visits    Other persons present Patient    Patient Concerns Nutrition/Meal planning;Glycemic Control;Monitoring;Problem Solving    Special Needs None    Preferred Learning Style No preference indicated    Learning Readiness Ready    How often do you need to have someone help you when you read instructions, pamphlets, or other written materials from your doctor or pharmacy? 1 - Never      Pre-Education Assessment   Patient understands the diabetes disease and treatment process. Needs Instruction    Patient understands incorporating nutritional management into lifestyle. Needs Instruction    Patient undertands incorporating physical activity into lifestyle. Needs Instruction    Patient understands using medications safely. Needs Instruction    Patient understands monitoring blood glucose, interpreting and using results Needs Instruction    Patient understands prevention, detection, and treatment of acute complications. Needs Instruction    Patient understands prevention, detection, and treatment of chronic complications. Needs Instruction    Patient understands how to develop strategies to address  psychosocial issues. Needs Instruction    Patient understands how to develop strategies to promote health/change behavior. Needs Instruction      Complications   How often do you check your blood sugar? 0 times/day (not testing)    Have you had a dilated eye exam in the past 12 months? Yes    Have you had a dental exam in the past 12 months? Yes    Are you checking your feet? Yes    How many days per week are you checking your feet? 7       Activity / Exercise   Activity / Exercise Type Light (walking / raking leaves)    How many days per week do you exercise? 7    How many minutes per day do you exercise? 30    Total minutes per week of exercise 210      Patient Education   Previous Diabetes Education No    Disease Pathophysiology Other (comment);Explored patient's options for treatment of their diabetes;Factors that contribute to the development of diabetes    Healthy Eating Carbohydrate counting;Plate Method;Meal options for control of blood glucose level and chronic complications.;Food label reading, portion sizes and measuring food.;Role of diet in the treatment of diabetes and the relationship between the three main macronutrients and blood glucose level    Being Active Role of exercise on diabetes management, blood pressure control and cardiac health.    Medications Other (comment)   reviewed types of insulin and time of action   Monitoring Taught/evaluated SMBG meter.;Identified appropriate SMBG and/or A1C goals.    Acute complications Taught prevention, symptoms, and  treatment of hypoglycemia - the 15 rule.;Discussed and identified patients' prevention, symptoms, and treatment of hyperglycemia.    Diabetes Stress and Support Identified and addressed patients feelings and concerns about diabetes;Worked with patient to identify barriers to care and solutions;Role of stress on diabetes      Individualized Goals (developed by patient)   Nutrition General guidelines for healthy choices and portions discussed;Other (comment);Carb counting   carb counting after surgery when instructed to do so.  currently focus on increased nutrition to prepare for surgery.   Medications take my medication as prescribed   post op   Monitoring  --   after surgery, discussed CGM's   Problem Solving Eating Pattern    Reducing Risk examine blood glucose patterns;treat hypoglycemia with 15 grams of carbs if blood glucose less than 70mg /dL;do foot  checks daily    Health Coping Ask for help with psychological, social, or emotional issues      Post-Education Assessment   Patient understands the diabetes disease and treatment process. Demonstrates understanding / competency    Patient understands incorporating nutritional management into lifestyle. Needs Review    Patient undertands incorporating physical activity into lifestyle. Needs Review    Patient understands using medications safely. Needs Review    Patient understands monitoring blood glucose, interpreting and using results Needs Review    Patient understands prevention, detection, and treatment of acute complications. Comprehends key points    Patient understands prevention, detection, and treatment of chronic complications. Comprehends key points    Patient understands how to develop strategies to address psychosocial issues. Needs Review    Patient understands how to develop strategies to promote health/change behavior. Needs Review      Outcomes   Expected Outcomes Demonstrated interest in learning. Expect positive outcomes    Future DMSE 3-4 months    Program Status Not Completed  Individualized Plan for Diabetes Self-Management Training:   Learning Objective:  Patient will have a greater understanding of diabetes self-management. Patient education plan is to attend individual and/or group sessions per assessed needs and concerns.   Plan:   Patient Instructions  Be sure to eat adequate amounts to prepare for surgery. Nourish your body well.  Expected Outcomes:  Demonstrated interest in learning. Expect positive outcomes  Education material provided: ADA - How to Thrive: A Guide for Your Journey with Diabetes, Food label handouts, Meal plan card, Snack sheet, and Diabetes Resources  If problems or questions, patient to contact team via:  Phone  Future DSME appointment: 3-4 months

## 2024-04-05 NOTE — Progress Notes (Signed)
 CM Note  Patient reported to non clinical staff member ongoing HA and requests a return call to discuss  Patient at [facility] 04/02/24 - 04/03/24 secondary to SOB and subsequent R PE.  Patient with pancreatic cancer.  CM called patient who advised that her headache has been in place since prior to her hospitalization, but didn't address same due to the PE.  Per patient her headache has minor dizziness associated with it.  Denies any other symptoms at this time.  Patient stated the only time she really didn't have a HA was during her hospitalization when she was receiving IV's.  CM inquired about hydration.  Patient replied she's not very good at same as she doesn't care for water.  Per patient she has maybe 30+ oz per day.  Discussed importance of increasing hydration which may alleviate the headache.  Suggested that patient obtain some type of container with a handle that she can carry with her everywhere and take sips of.  Patient verbalized understanding and stated she will do so.   Reviewed evaluation of urine color and tenting of skin (skin turgor) as potential indicators of dehydratin.  CM encouraged patient to increase her hydration and to call CM back if same has no improvement.  Instructed patient to seek services of ED should any sudden onset of symptoms occur or if HA and dizziness worsen.  Patint verbalized understanding of same.   Devere FELIX Shona, BSN, RN, CCM [employer] [facility] Office Phone    [phone]

## 2024-04-05 NOTE — Patient Instructions (Signed)
 Be sure to eat adequate amounts to prepare for surgery. Nourish your body well.

## 2024-04-05 NOTE — Progress Notes (Signed)
 [facility] Care Transitions: Post-Discharge Assessment Escalation  04/05/2024 at 11:14 AM  Concerns identified, escalated to a Care Transition RN.    TRANSITIONAL CARE TELEPHONE ENCOUNTER NOTE   Alyssa Harper is a 63 y.o. female who was contacted after a hospitalization at [facility] on 04/03/24 for SOB:  The following were reviewed with the patient today:   Note: Only barriers and interventions are noted below. If no flowsheet data is found, then no issues were identified or the patient was not able to be reached.    Patient Knowledge and Self Care      04/05/2024   11:10 AM  Patient Knowledge and Self-Care  Did the patient/caregiver understand the primary reason for hospitalization? Yes  Is the patient or the patient's caregiver able to describe what is necessary for self care at this time? Yes  Are there any additional educational needs identified today? No     Discharge Follow-Up Appointment      04/05/2024   11:10 AM  Appointments  Does the patient have an appropriate follow-up visit scheduled within 14 days of discharge? Yes  Additional Comments Stated has an appointment with oncologist.  Are there any barriers to the patient keeping their appointment? No  Has the patient been hospitalized more than once in the past 30 days? No    Financial Resource Strain: Low Risk  (04/05/2024)   Overall Financial Resource Strain (CARDIA)   . Difficulty of Paying Living Expenses: Not hard at all    Transportation Needs: No Transportation Needs (04/05/2024)   PRAPARE - Transportation   . Lack of Transportation (Medical): No   . Lack of Transportation (Non-Medical): No    Food Insecurity: No Food Insecurity (04/02/2024)   Hunger Vital Sign   . Worried About Programme researcher, broadcasting/film/video in the Last Year: Never true   . Ran Out of Food in the Last Year: Never true      Medication Review      04/05/2024   11:10 AM  Medication Review  Were any new medicines prescribed at  discharge? Yes  Was the patient able to get all of their prescriptions? Yes  Does the patient understand their medications including how to take them? Yes  Were there any medication interventions/escalations? No  Reconciled current and discharge medications? (RN and Pharmacist ONLY) No      New or Worsening Health Issues      04/05/2024   11:10 AM  New or Worsening Health Issues  Is the patient experiencing any new or worsening symptoms after discharge? Yes*  Additional Comments Stated has been having a headache with pain level of 5 to 6. Stated she gets up and move around but she does not feel very well. Would like a call about this.  Symptoms noted following discharge: Pain issues (including pain control)      Home Health       04/05/2024   11:10 AM  Home Health  Has your home care agency contacted you? Not Applicable    Is patient eligible for TCM coding?:     04/05/2024   11:10 AM  [facility] Coding Eligibility  Did you communicate with the patient and/or caregiver within 2 business days of discharge OR Did you document two or more separate communication attempts within 2 business days of discharge? Yes  Is the patient eligible for TCM billing? Yes - (patient is eligible for TCM coding)    The importance of keeping the appointment was emphasized to her.  Future Appointments     Date/Time Provider Department Center Visit Type   09/18/2024 9:00 AM [facility] 1A BNE [facility] Bone Density [facility] DXA BONE DENSITY   09/18/2024 10:40 AM (Arrive by 10:10 AM) Allen Debby PARAS, MD [facility] ENDO RETURN ADULT       Alyssa Harper    *Some images could not be shown.

## 2024-04-07 ENCOUNTER — Encounter: Payer: Self-pay | Admitting: Oncology

## 2024-04-10 ENCOUNTER — Other Ambulatory Visit

## 2024-04-10 ENCOUNTER — Ambulatory Visit: Admitting: Nurse Practitioner

## 2024-04-10 ENCOUNTER — Ambulatory Visit

## 2024-04-17 ENCOUNTER — Telehealth: Payer: Self-pay | Admitting: *Deleted

## 2024-04-17 NOTE — Telephone Encounter (Addendum)
 Alyssa Harper called to request an appointment with Dr. Cloretta this week. As of 04/02/24 on CT she has been diagnosed with a new pulmonary embolism. Is on Eliquis 5 mg bid currently. Was told by [facility] provider and [facility] that Dr. Cloretta will manage this. Leaves for [facility] 11/3 for 2 weeks of RT there. Returns home for 1 week and then returns on 11/24 for surgery per Dr. Eric Angelique Furnace.  Says she is feeling OK and breathing is slightly improved. She  just wishes to discuss all that has happened before going back to [place].  Scheduled for 10/30 at 3:10 pm

## 2024-04-19 ENCOUNTER — Inpatient Hospital Stay (HOSPITAL_BASED_OUTPATIENT_CLINIC_OR_DEPARTMENT_OTHER): Admitting: Oncology

## 2024-04-19 ENCOUNTER — Encounter: Payer: Self-pay | Admitting: Oncology

## 2024-04-19 ENCOUNTER — Ambulatory Visit (HOSPITAL_BASED_OUTPATIENT_CLINIC_OR_DEPARTMENT_OTHER)
Admission: RE | Admit: 2024-04-19 | Discharge: 2024-04-19 | Disposition: A | Discharge status: Home / Self Care | Source: Ambulatory Visit | Attending: Oncology | Admitting: Oncology

## 2024-04-19 VITALS — BP 97/75 | HR 75 | Temp 97.7°F | Resp 18 | Ht 65.0 in | Wt 125.8 lb

## 2024-04-19 DIAGNOSIS — Z5111 Encounter for antineoplastic chemotherapy: Secondary | ICD-10-CM | POA: Diagnosis not present

## 2024-04-19 DIAGNOSIS — C259 Malignant neoplasm of pancreas, unspecified: Secondary | ICD-10-CM | POA: Diagnosis not present

## 2024-04-19 DIAGNOSIS — R112 Nausea with vomiting, unspecified: Secondary | ICD-10-CM | POA: Diagnosis not present

## 2024-04-19 DIAGNOSIS — I2699 Other pulmonary embolism without acute cor pulmonale: Secondary | ICD-10-CM

## 2024-04-19 MED ORDER — LORAZEPAM 0.5 MG PO TABS: 0.5000 mg | ORAL_TABLET | Freq: Four times a day (QID) | ORAL | 1 refills | Status: AC | PRN

## 2024-04-19 MED ORDER — ONDANSETRON HCL 8 MG PO TABS: 8.0000 mg | ORAL_TABLET | Freq: Three times a day (TID) | ORAL | 2 refills | Status: AC | PRN

## 2024-04-19 MED ORDER — APIXABAN 5 MG PO TABS: 5.0000 mg | ORAL_TABLET | Freq: Two times a day (BID) | ORAL | 5 refills | Status: DC

## 2024-04-19 MED ORDER — LIDOCAINE-PRILOCAINE 2.5-2.5 % EX CREA: 1.0000 | TOPICAL_CREAM | CUTANEOUS | 2 refills | Status: AC | PRN

## 2024-04-19 NOTE — Progress Notes (Signed)
 [facility] OFFICE PROGRESS NOTE   Diagnosis: Pancreas cancer  INTERVAL HISTORY:   Alyssa Harper completed another cycle of chemotherapy on 03/27/2024.  She had malaise following chemotherapy.  Neuropathy symptoms have resolved.  She underwent a restaging CT evaluation at [facility] on 04/02/2024 and was found to have a large right pulmonary artery embolus extending into the right middle lobe and right lower lobe branches.  No evidence of right heart strain. She was admitted and placed on heparin .  She was discharged 04/03/2024 on apixaban.  She continues apixaban.  She reports increased exertional dyspnea for the past several weeks.  She has a cough.  She feels partially improved after starting apixaban.  She reports pain in the right calf for the past 2 days.  No trauma.  Alyssa Harper is scheduled to travel to [facility] on 04/22/2024 to begin concurrent capecitabine and radiation.   Objective:  Vital signs in last 24 hours:  Blood pressure 97/75, pulse 75, temperature 97.7 F (36.5 C), temperature source Temporal, resp. rate 18, height 5' 5 (1.651 m), weight 125 lb 12.8 oz (57.1 kg), SpO2 98%.    HEENT: No thrush, healing linear ulcers at the lateral tongue bilaterally Resp: Lungs clear bilaterally, decreased breath sounds at the right lower posterior chest, no respiratory distress Cardio: Regular rate and rhythm GI: No hepatosplenomegaly, no mass, no apparent ascites Vascular: No neck or arm edema.  No venous engorgement over the chest.  Slight enlargement of the right compared to the left calf.  No erythema or palpable cord.  Mild tenderness at the medial left calf near a birthmark   Portacath/PICC-without erythema  Lab Results:  Lab Results  Component Value Date   WBC 8.4 03/26/2024   HGB 12.3 03/26/2024   HCT 38.1 03/26/2024   MCV 101.1 (H) 03/26/2024   PLT 322 03/26/2024   NEUTROABS 4.4 03/26/2024    CMP  Lab Results  Component Value Date   NA 139 03/26/2024   K 4.3  03/26/2024   CL 104 03/26/2024   CO2 27 03/26/2024   GLUCOSE 104 (H) 03/26/2024   BUN 14 03/26/2024   CREATININE 0.65 03/26/2024   CALCIUM  9.3 03/26/2024   PROT 6.7 03/26/2024   ALBUMIN 4.1 03/26/2024   AST 30 03/26/2024   ALT 24 03/26/2024   ALKPHOS 193 (H) 03/26/2024   BILITOT 0.3 03/26/2024   GFRNONAA >60 03/26/2024   GFRAA >60 09/13/2018    Lab Results  Component Value Date   RJW800 42 (H) 03/26/2024    Medications: I have reviewed the patient's current medications.   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-FNA of pancreas body lesion: Adenocarcinoma 07/22/2018 distal pancreatectomy and splenectomy, adenocarcinoma with mucinous component, moderately differentiated, 0/16 nodes, negative margins,pT1pN0 Adjuvant FOLFIRINOX completed 02/22/2019 10/13/2023 CT Abdo/pelvis: Distended stomach, no evidence of recurrent tumor or distant metastatic disease 10/18/2023 EGD with stricture, status post a gastrojejunostomy 10/24/2023: Firm mass involving the head of the pancreas and duodenal bulb, serosal implant biopsy positive for adenocarcinoma-moderately differentiated, foundation 1-BRCA2 rearrangement, KRAS G 12V 11/08/2023 CTs at [facility]: Unchanged tumor in the anterior pancreas neck inseparable from the duodenal bulb, new hepatic segment 4 subcapsular lesion increased size and conspicuity of subcentimeter peripancreatic/anterior central mesenteric nodes 11/25/2023: Cycle 1 NALIRIFOX 12/13/2023: Cycle 2 NALIRIFOX, oxaliplatin  further dose reduced and irinotecan held, G-CSF added 01/03/2024: Cycle 3 NALIRIFOX, irinotecan held, G -CSF 01/17/2024: Cycle 4 NALIRIFOX, irinotecan held, G-CSF 01/27/2024 CTs at [facility]: Decrease of the ill-defined mass between the anterior pancreas  head/neck and posterior duodenal bulb, previous hypodense posterior segment 4 lesion not visualized 02/14/2024: Cycle 5 NALIRIFOX, irinotecan was held, G-CSF 02/28/2024: Cycle 6 NALIRIFOX, irinotecan held,  G-CSF 03/13/2024: Cycle 7 NALIRIFOX, irinotecan held, G-CSF 03/26/2024: Cycle 8 NALIRIFOX, irinotecan held, G-CSF 04/02/2024 CTs: Right pulmonary embolism, soft tissue between the anterior aspect of the pancreas head/neck and distal stomach-unchanged, no retroperitoneal or mesenteric lymphadenopathy  2.   Admission 11/29/2023 with intractable nausea/vomiting 3.   Persistent nausea, diarrhea, and cramping abdominal pain following cycle 1 NALIRIFOX 4.   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   01/05/2024 germline CancerNext expanded panel: Negative 6.   04/02/2024 pulmonary embolism right main pulmonary artery extending to right middle lobe and right lower lobe branches-heparin , discharged 04/03/2024 on apixaban       Disposition: Alyssa Harper has pancreas cancer.  She has completed 8 cycles of salvage systemic therapy.  Restaging CTs on 04/02/2024 revealed no evidence of disease progression.  She is scheduled to undergo concurrent capecitabine and radiation beginning next week.  She is scheduled for surgery 05/14/2024.  She was diagnosed with a large right pulmonary embolism on 04/02/2024.  She is maintained on apixaban anticoagulation.  She reports right calf discomfort today.  She will be referred for bilateral lower extremity Dopplers today.  Alyssa Harper is scheduled for follow-up in medical oncology and radiation oncology at [facility] next week.  I have communicated with Dr. Dasie over the past few weeks.  Dr. Dasie is prescribing capecitabine to be given concurrent with radiation.  She is aware of the pulmonary embolism.  Alyssa Harper reports the [facility] surgeon is also aware of the pulmonary embolism diagnosis and plans to proceed with the pancreas surgery.  I will communicate results of the lower extremity Dopplers to the [facility] physicians.  She will continue apixaban anticoagulation.  We will consider a change to Lovenox  if the Doppler confirms a  DVT.  Alyssa Harper will return for a follow-up visit as scheduled on 05/08/2024. I am available to see her sooner as needed.  Arley Hof, MD  04/19/2024  5:06 PM

## 2024-04-20 ENCOUNTER — Ambulatory Visit: Payer: Self-pay | Admitting: Oncology

## 2024-04-20 ENCOUNTER — Telehealth: Payer: Self-pay

## 2024-04-20 NOTE — Telephone Encounter (Signed)
 I informed the patient her doppler was negative for deep venous thrombosis within both legs.

## 2024-04-22 ENCOUNTER — Encounter: Payer: Self-pay | Admitting: Oncology

## 2024-04-23 NOTE — Telephone Encounter (Signed)
 Sent chat to oral oncology team to investigate

## 2024-05-08 ENCOUNTER — Inpatient Hospital Stay: Attending: Hematology and Oncology | Admitting: Oncology

## 2024-05-08 VITALS — BP 106/68 | HR 92 | Temp 97.9°F | Resp 16 | Wt 123.4 lb

## 2024-05-08 DIAGNOSIS — C250 Malignant neoplasm of head of pancreas: Secondary | ICD-10-CM | POA: Insufficient documentation

## 2024-05-08 DIAGNOSIS — C259 Malignant neoplasm of pancreas, unspecified: Secondary | ICD-10-CM

## 2024-05-08 DIAGNOSIS — Z86711 Personal history of pulmonary embolism: Secondary | ICD-10-CM | POA: Insufficient documentation

## 2024-05-08 DIAGNOSIS — Z9221 Personal history of antineoplastic chemotherapy: Secondary | ICD-10-CM | POA: Insufficient documentation

## 2024-05-08 NOTE — Progress Notes (Signed)
 [facility] OFFICE PROGRESS NOTE   Diagnosis: Pancreas cancer  INTERVAL HISTORY:   Alyssa Harper began radiation to the pancreas mass on 04/23/2024.SABRA  She completed radiation 05/04/2024.  She took Xeloda for 4 days.  She reports taking lower than the prescribed dose and despite this she developed severe nausea and vomiting.  No mouth sores, diarrhea, or hand/foot pain.  The Xeloda was discontinued.  Nausea has improved.  She has malaise.  She continues to have exertional dyspnea.  She is taking apixaban anticoagulation.  Calf pain has resolved. [name] is scheduled for a pancreaticoduodenectomy on 05/14/2024.  The [facility] physicians are aware of the recent pulmonary embolism diagnosis.  She is scheduled to discontinue apixaban prior to surgery.  She has been evaluated by endocrinology in anticipation of developing insulin-dependent diabetes.  Objective:  Vital signs in last 24 hours:  Blood pressure 106/68, pulse 92, temperature 97.9 F (36.6 C), temperature source Temporal, resp. rate 16, weight 123 lb 6.4 oz (56 kg), SpO2 97%.   Lymphatics: No cervical or supraclavicular nodes Lungs: Slight decrease in breath sounds at the right lower posterior chest, no respiratory distress, no dullness to percussion Cardio: Regular rate and rhythm GI: No hepatosplenomegaly, no mass, mild erythema at the upper abdomen Vascular: No leg edema or tenderness   Portacath/PICC-without erythema  Lab Results:  Lab Results  Component Value Date   WBC 8.4 03/26/2024   HGB 12.3 03/26/2024   HCT 38.1 03/26/2024   MCV 101.1 (H) 03/26/2024   PLT 322 03/26/2024   NEUTROABS 4.4 03/26/2024    CMP  Lab Results  Component Value Date   NA 139 03/26/2024   K 4.3 03/26/2024   CL 104 03/26/2024   CO2 27 03/26/2024   GLUCOSE 104 (H) 03/26/2024   BUN 14 03/26/2024   CREATININE 0.65 03/26/2024   CALCIUM  9.3 03/26/2024   PROT 6.7 03/26/2024   ALBUMIN 4.1 03/26/2024   AST 30 03/26/2024   ALT 24  03/26/2024   ALKPHOS 193 (H) 03/26/2024   BILITOT 0.3 03/26/2024   GFRNONAA >60 03/26/2024   GFRAA >60 09/13/2018    Lab Results  Component Value Date   RJW800 42 (H) 03/26/2024    Medications: I have reviewed the patient's current medications.   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-FNA of pancreas body lesion: Adenocarcinoma 07/22/2018 distal pancreatectomy and splenectomy, adenocarcinoma with mucinous component, moderately differentiated, 0/16 nodes, negative margins,pT1pN0 Adjuvant FOLFIRINOX completed 02/22/2019 10/13/2023 CT Abdo/pelvis: Distended stomach, no evidence of recurrent tumor or distant metastatic disease 10/18/2023 EGD with stricture, status post a gastrojejunostomy 10/24/2023: Firm mass involving the head of the pancreas and duodenal bulb, serosal implant biopsy positive for adenocarcinoma-moderately differentiated, foundation 1-BRCA2 rearrangement, KRAS G 12V 11/08/2023 CTs at [facility]: Unchanged tumor in the anterior pancreas neck inseparable from the duodenal bulb, new hepatic segment 4 subcapsular lesion increased size and conspicuity of subcentimeter peripancreatic/anterior central mesenteric nodes 11/25/2023: Cycle 1 NALIRIFOX 12/13/2023: Cycle 2 NALIRIFOX, oxaliplatin  further dose reduced and irinotecan held, G-CSF added 01/03/2024: Cycle 3 NALIRIFOX, irinotecan held, G -CSF 01/17/2024: Cycle 4 NALIRIFOX, irinotecan held, G-CSF 01/27/2024 CT[facility] at [facility]: Decrease of the ill-defined mass between the anterior pancreas head/neck and posterior duodenal bulb, previous hypodense posterior segment 4 lesion not visualized 02/14/2024: Cycle 5 NALIRIFOX, irinotecan was held, G-CSF 02/28/2024: Cycle 6 NALIRIFOX, irinotecan held, G-CSF 03/13/2024: Cycle 7 NALIRIFOX, irinotecan held, G-CSF 03/26/2024: Cycle 8 NALIRIFOX, irinotecan held, G-CSF 04/02/2024 CTs: Right pulmonary embolism, soft tissue between the anterior aspect  of the pancreas head/neck and distal stomach-unchanged,  no retroperitoneal or mesenteric lymphadenopathy 04/23/2024 - 05/04/2024-pancreas radiation with concurrent capecitabine (capecitabine discontinued after 4 days due to nausea and vomiting)  2.   Admission 11/29/2023 with intractable nausea/vomiting 3.   Persistent nausea, diarrhea, and cramping abdominal pain following cycle 1 NALIRIFOX 4.   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   01/05/2024 germline CancerNext expanded panel: Negative 6.   04/02/2024 pulmonary embolism right main pulmonary artery extending to right middle lobe and right lower lobe branches-heparin , discharged 04/03/2024 on apixaban 04/19/2024 bilateral lower extremity Dopplers: Negative for DVT   Disposition: Alyssa Harper has a history of locally recurrent pancreas cancer.  She completed a course of neoadjuvant chemotherapy with a decrease in the peripancreatic mass and CA 19-9.  She completed a course of radiation to the pancreas mass and is scheduled for a pancreaticoduodenectomy on 05/14/2024.  She is maintained on apixaban anticoagulation after being diagnosed with a pulmonary embolism on 04/02/2024.  The [facility] physicians are aware of the pulmonary embolism diagnosis and recommend proceeding with surgery.  I will see [name] following surgery to review the surgical pathology and discuss systemic treatment options.  We discussed the possibility of a maintenance PARP inhibitor given the BRCA2 alteration on the May 2025 pancreas mass biopsy.  Arley Hof, MD  05/08/2024  11:44 AM

## 2024-05-11 ENCOUNTER — Encounter: Payer: Self-pay | Admitting: Oncology

## 2024-05-11 ENCOUNTER — Other Ambulatory Visit: Payer: Self-pay | Admitting: Nurse Practitioner

## 2024-05-11 DIAGNOSIS — C259 Malignant neoplasm of pancreas, unspecified: Secondary | ICD-10-CM

## 2024-05-11 MED ORDER — ALPRAZOLAM 0.25 MG PO TABS: 0.2500 mg | ORAL_TABLET | Freq: Two times a day (BID) | ORAL | 0 refills | Status: DC | PRN

## 2024-05-16 ENCOUNTER — Encounter: Payer: Self-pay | Admitting: Internal Medicine

## 2024-05-21 ENCOUNTER — Telehealth: Payer: Self-pay

## 2024-05-21 ENCOUNTER — Encounter: Payer: Self-pay | Admitting: Oncology

## 2024-05-21 ENCOUNTER — Other Ambulatory Visit (HOSPITAL_COMMUNITY): Payer: Self-pay

## 2024-05-21 NOTE — Progress Notes (Signed)
 ?  Inpatient Diabetes Progress Note  Name: ??Cheryel Kyte MRN: ??[id number] Location: [id number] A Date: ??05/21/2024  Subjective/24 hr events:   Tolerating PO intake.  Interm abd pain Has vertigo this morning, has hx of    Objective Last vitals  37.1 C (98.7 F)  P 88  BP 124/60  RR 20   SpO2 96 %    2  FiO2      54.4 kg (120 lb)  Physical Exam General: appears somewhat uncomfortable   Review of recent fingerstick BG values: Recent Labs  Lab Units 05/21/24 0729 05/21/24 0549 05/20/24 2117 05/20/24 1658 05/20/24 1537 05/20/24 1130 05/20/24 0736 05/20/24 0513 05/19/24 2048 05/19/24 1652 05/19/24 1105 05/19/24 0722 05/19/24 0615 05/19/24 9388 05/18/24 0510 05/18/24 0406 05/17/24 0729 05/17/24 9356 05/16/24 0743 05/16/24 0436 05/15/24 0420 05/15/24 0201  GLUCOSE (POC) mg/dL 897*  --  863* 889* 95 203* 131*  --  235* 140* 190* 154*   < >  --    < >  --    < >  --    < >  --    < > 123*  GLUCOSE mg/dL  --  99  --   --   --   --   --  137*  --   --   --   --   --  136*  --  229*  --  96  --  134*  --  146*   < > = values in this interval not displayed.   Lab Results  Component Value Date   CRE 0.50 05/21/2024   CRE 0.52 05/20/2024   CRE 0.50 05/19/2024   eGFR  Date Value Ref Range Status  05/21/2024 105 >59 mL/min/1.69m2 Final    Comment:    Estimated glomerular filtration rate calculated using the CKD-EPI refit equation.       Notes reviewed: surgery, ADAT.   Assessment/Plan  Assessment & Plan Diabetes mellitus due to underlying condition without complication, without long-term current use of insulin Ms. Jazzy Parmer is a 63 y.o. 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 Dasie, [facility]) concerning for new primary  pancreatic adenocarcinoma, now s/p neoadjuvant chemotherapy c/b PE (on Eliquis ), currently receiving short chemoradiation, who presents for Whipple (05/14/24, [facility]).  ? Per review of chart, no history of diabetes prior to current admission. Has been seen by local endocrinologist in [place] at [facility] 03/20/2024 to preemptively plan for need for insulin deficiency post planned total pancreatectomy. A1c 5.8 05/02/2024  _________________________________________ BG  range overall within target range of 100-180 mg/dl. Transitioned off insulin gtt to NPH 11/24 and then to Lantus 6 units on 11/25. However, patient felt lightheaded a few minutes after Lantus was administered (BG was 124 at the time). Transitioned to NPH and again back to Lantus this morning 11/28, which she tolerated well.   Daughters were at bedtime this morning. Provided detailed education about diabetes management (basal/bolus insulin) and guided them on CGM placement. Dexcom G7 sensor now in place. In preparation for possible discharge on 11/30, discussed initiating insulin pen teaching. We can also set patient up with virtual follow-up at the [facility] while she is in [name] .    Glycemia controlled Has dexcom Discussed glucose homeostasis in patient with insulin deficiency 2nd to pancreatectomy  Glycemia controlled  Recommendations: -Lantus to 8 units daily (6 units if NPO)  -Lispro low dose 0-6 unit sliding scale q6h -Check FSBG QAC + HS  -Nursing to provide insulin and glucometer teaching- daughters will pick up insulin pens (Lantus and Lispro) and glucometer at the [facility],     Westley Christians, MD Inpatient Diabetes Attending

## 2024-05-21 NOTE — Telephone Encounter (Signed)
 MD from [facility] called requesting to speak with patient's MD.

## 2024-05-21 NOTE — Telephone Encounter (Signed)
 I did speak to Dr. Rosan on 05/21/2024 at 1450.  Patient is s/p pancreatectomy, on MDI regimen  I did explain that I will be out of the office the week of December 15 in the week of the 22nd.   I will be happy to see her back the week of the 29th   We also have doctors on-call  Our CDE is also available for further education if needed

## 2024-05-21 NOTE — Telephone Encounter (Signed)
 Pharmacy Patient Advocate Encounter   Received notification from Patient Advice Request messages that prior authorization for Dexcom G7 sensors is required/requested.   Insurance verification completed.   The patient is insured through [facility].   Per test claim: PA is required and will need to show that she is currently using insulin. I see several notes stating that she will begin taking insulin, however I don't see that any have been prescribed as of yet. This will need to be updated before a PA can be completed. Thank you

## 2024-05-28 NOTE — Telephone Encounter (Signed)
 Prior auth team says patient needs to be seen before prior auth can be done.  How would you like to proceed?

## 2024-05-31 ENCOUNTER — Telehealth: Payer: Self-pay | Admitting: Internal Medicine

## 2024-05-31 ENCOUNTER — Encounter: Payer: Self-pay | Admitting: *Deleted

## 2024-05-31 ENCOUNTER — Telehealth: Payer: Self-pay | Admitting: *Deleted

## 2024-05-31 NOTE — Progress Notes (Signed)
 PATIENT NAVIGATOR PROGRESS NOTE  Name: Alyssa Harper Date: 05/31/2024 MRN: [id number]  DOB: 1960-11-09   Reason for visit:  F/U visit after return from [place]  Comments:  Pt and her daughter Vernell called to schedule appt with Dr Cloretta for Monday. She will be returning from [place] this weekend after Whipple procedure. Scheduled for labs and Dr Cloretta for 12/15 at 1:45pm. Verbalized understanding    Time spent counseling/coordinating care: 45-60 minutes

## 2024-05-31 NOTE — Telephone Encounter (Signed)
 I have received a call from the primary surgery team signing out Alyssa Harper's care, the primary team is under the impression that we are unable to see the patient without the signout.    I CLEARLY explained to the surgeon that this is NOT the reason why I am unable to offer a virtual visit to the patient, as I did have 2 openings today and could have easily seen the patient.   The reason for not offering a virtual visit is that the patient is currently not in the state of [name]  and she is in [name]    [name] licenses cannot offer virtual visit to patients who are not in the state of [name]  at the time of the visit.   The patient already sent a message indicating that she has been waiting for an appointment and I clearly again indicated the reason.   Patient is welcome to contact the office when she is back in the state of [name] .   Patient also was made aware on her initial visit with me on the times that I will be out of the office.

## 2024-05-31 NOTE — Telephone Encounter (Signed)
 Comer,   Can you please call this patient through the phone (not through MyChart)  She recently had a pancreatic surgery for cancer and has been on insulin.  Her endocrine team has contacted me and stated that her sugars are under control.  I have received multiple calls about her as well as multiple MyChart messages asking for a virtual appointment.   Even though I did state to the patient multiple times,  that based on the [place] medical board, [place] licensees cannot offer virtual visit to patient or not virtually in the state of [place] .   She is currently in [place].  But somehow the MyChart messages make it seem like we just do not have a spot for her which is an accurate, they have been asked to be seen by the PA????   It appears that somebody from her family is sending us  messages but not reading what I am responding by   So please make sure you talk to Ms. Sellin directly and let her know that when she is in the state of [place]  we will be happy to offer her a virtual visit.  Otherwise, none of the doctors here can see her until she is back

## 2024-05-31 NOTE — Telephone Encounter (Signed)
 I called patients primary number on file [phone].  Identified patient using 2 identifiers and proceeded with call.  I advised Alyssa Harper that I was following up on the requests we have received for scheduling a visit.  I advised that the reason that a virtual call has not been offered is because she currently is in [place] and that based on the [place] medical board, [place] licensees cannot offer virtual visits to patients who are not physically in the state of [place] . She expressed understanding of this fact.    Patient did advise that she would be returning to [facility] on Saturday June 02, 2024.  I requested that she contact the office on the first business day after she returns to the state and request an appointment.

## 2024-05-31 NOTE — Telephone Encounter (Signed)
 Call from niece, Vernell Parkin (not on ROI) requesting earlier appointment with Dr. Cloretta than 12/22. Being discharged from hospital soon and she wants to be seen early next week and to work on getting home health started. Called Mrs. Mcelveen and she confirmed she hopes to be discharged in next 24 hours and does want to be seen sooner. Informed her that discharge team should be the ones ordering home health and she told this RN that she was told we don't do that. Made her aware that home health referral requires MD to see her to determine the needs. She understands and agrees.

## 2024-06-04 ENCOUNTER — Inpatient Hospital Stay

## 2024-06-04 ENCOUNTER — Telehealth: Payer: Self-pay | Admitting: Internal Medicine

## 2024-06-04 ENCOUNTER — Inpatient Hospital Stay: Attending: Hematology and Oncology | Admitting: Oncology

## 2024-06-04 VITALS — BP 118/66 | HR 92 | Temp 97.8°F | Resp 18 | Ht 65.0 in | Wt 121.1 lb

## 2024-06-04 VITALS — BP 127/59 | HR 72 | Temp 98.2°F | Resp 18

## 2024-06-04 DIAGNOSIS — C259 Malignant neoplasm of pancreas, unspecified: Secondary | ICD-10-CM

## 2024-06-04 DIAGNOSIS — I2699 Other pulmonary embolism without acute cor pulmonale: Secondary | ICD-10-CM | POA: Diagnosis not present

## 2024-06-04 DIAGNOSIS — E891 Postprocedural hypoinsulinemia: Secondary | ICD-10-CM | POA: Diagnosis not present

## 2024-06-04 DIAGNOSIS — E860 Dehydration: Secondary | ICD-10-CM

## 2024-06-04 DIAGNOSIS — Z7902 Long term (current) use of antithrombotics/antiplatelets: Secondary | ICD-10-CM | POA: Insufficient documentation

## 2024-06-04 DIAGNOSIS — C251 Malignant neoplasm of body of pancreas: Secondary | ICD-10-CM | POA: Diagnosis present

## 2024-06-04 DIAGNOSIS — R110 Nausea: Secondary | ICD-10-CM

## 2024-06-04 LAB — CMP (CANCER CENTER ONLY)
ALT: <5 U/L (ref 0–44)
AST: 18 U/L (ref 15–41)
Albumin: 3.6 g/dL (ref 3.5–5.0)
Alkaline Phosphatase: 137 U/L — ABNORMAL HIGH (ref 38–126)
Anion gap: 13 (ref 5–15)
BUN: 9 mg/dL (ref 8–23)
CO2: 28 mmol/L (ref 22–32)
Calcium: 9 mg/dL (ref 8.9–10.3)
Chloride: 94 mmol/L — ABNORMAL LOW (ref 98–111)
Creatinine: 0.65 mg/dL (ref 0.44–1.00)
GFR, Estimated: >60 mL/min (ref >60)
Glucose, Bld: 222 mg/dL — ABNORMAL HIGH (ref 70–99)
Potassium: 4.9 mmol/L (ref 3.5–5.1)
Sodium: 135 mmol/L (ref 135–145)
Total Bilirubin: 0.4 mg/dL (ref 0.0–1.2)
Total Protein: 7.4 g/dL (ref 6.5–8.1)

## 2024-06-04 LAB — CBC WITH DIFFERENTIAL (CANCER CENTER ONLY)
Abs Immature Granulocytes: 0.02 K/uL (ref 0.00–0.07)
Basophils Absolute: 0 K/uL (ref 0.0–0.1)
Basophils Relative: 1 %
Eosinophils Absolute: 0.1 K/uL (ref 0.0–0.5)
Eosinophils Relative: 2 %
HCT: 32.6 % — ABNORMAL LOW (ref 36.0–46.0)
Hemoglobin: 10.3 g/dL — ABNORMAL LOW (ref 12.0–15.0)
Immature Granulocytes: 0 %
Lymphocytes Relative: 14 %
Lymphs Abs: 0.8 K/uL (ref 0.7–4.0)
MCH: 29.9 pg (ref 26.0–34.0)
MCHC: 31.6 g/dL (ref 30.0–36.0)
MCV: 94.8 fL (ref 80.0–100.0)
Monocytes Absolute: 0.8 K/uL (ref 0.1–1.0)
Monocytes Relative: 15 %
Neutro Abs: 3.5 K/uL (ref 1.7–7.7)
Neutrophils Relative %: 68 %
Platelet Count: 935 K/uL (ref 150–400)
RBC: 3.44 MIL/uL — ABNORMAL LOW (ref 3.87–5.11)
RDW: 16 % — ABNORMAL HIGH (ref 11.5–15.5)
WBC Count: 5 K/uL (ref 4.0–10.5)
nRBC: 0 % (ref 0.0–0.2)

## 2024-06-04 LAB — SAMPLE TO BLOOD BANK

## 2024-06-04 MED ORDER — LIDOCAINE-PRILOCAINE 2.5-2.5 % EX CREA
TOPICAL_CREAM | Freq: Once | CUTANEOUS | Status: AC
  Filled 2024-06-04: qty 5

## 2024-06-04 MED ORDER — SODIUM CHLORIDE 0.9 % IV SOLN: INTRAVENOUS | Status: AC

## 2024-06-04 NOTE — Progress Notes (Signed)
 CRITICAL VALUE STICKER  CRITICAL VALUE:PTLS [phone]   RECEIVER (on-site recipient of call):Azam Gervasi   DATE & TIME NOTIFIED: 06/04/24 1703  MESSENGER (representative from lab):  MD NOTIFIED: GBS 06/04/24  TIME OF NOTIFICATION:  RESPONSE:

## 2024-06-04 NOTE — Progress Notes (Signed)
 [facility] OFFICE PROGRESS NOTE   Diagnosis: Pancreas cancer  INTERVAL HISTORY:   [name] underwent a Whipple/completion pancreatectomy in [place] on 05/14/2024.  She was discharged to the hospital 05/24/2024 and readmitted a few days later.  She was again discharged 05/31/2024 and returned home on 06/02/2024.  She underwent a Whipple procedure, intraoperative radiation, and partial excision of segment 4 of the liver 05/14/2024.  She was found to have a pancreas head mass.  She developed postoperative pneumonia.  She was readmitted 05/26/2024 with abdominal pain.  A CT revealed an increased fluid collection in the surgical bed.  She was noted to have small bilateral pleural effusions and lower lobe consolidation.  Her pain controlled in the hospital.  She was treated for hypoglycemia.  She was discharged to a hotel on 05/31/2024.  She continues to have upper abdominal pain, though this has improved.  She is tolerating a diet.  She has dizziness and nausea.  Her blood sugar is being monitored via a Dexcom device and she is scheduled to see endocrinology tomorrow.      Objective:  Vital signs in last 24 hours:  Blood pressure 118/66, pulse 92, temperature 97.8 F (36.6 C), temperature source Temporal, resp. rate 18, height 5' 5 (1.651 m), weight 121 lb 1.6 oz (54.9 kg), SpO2 98%.    HEENT: The mucous membranes are moist, no thrush Resp: Decreased breath sounds at the lower posterior chest on the right greater than left, no respiratory distress Cardio: Regular rate and rhythm GI: Tender in the left mid subcostal area, healed midline incision, no hepatosplenomegaly Vascular: Trace pitting edema at the ankle bilaterally    Portacath/PICC-without erythema  Lab Results:  Lab Results  Component Value Date   WBC 8.4 03/26/2024   HGB 12.3 03/26/2024   HCT 38.1 03/26/2024   MCV 101.1 (H) 03/26/2024   PLT 322 03/26/2024   NEUTROABS 4.4 03/26/2024    CMP  Lab  Results  Component Value Date   NA 139 03/26/2024   K 4.3 03/26/2024   CL 104 03/26/2024   CO2 27 03/26/2024   GLUCOSE 104 (H) 03/26/2024   BUN 14 03/26/2024   CREATININE 0.65 03/26/2024   CALCIUM  9.3 03/26/2024   PROT 6.7 03/26/2024   ALBUMIN 4.1 03/26/2024   AST 30 03/26/2024   ALT 24 03/26/2024   ALKPHOS 193 (H) 03/26/2024   BILITOT 0.3 03/26/2024   GFRNONAA >60 03/26/2024   GFRAA >60 09/13/2018    Lab Results  Component Value Date   RJW800 42 (H) 03/26/2024    Medications: I have reviewed the patient's current medications.   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-FNA of pancreas body lesion: Adenocarcinoma 07/22/2018 distal pancreatectomy and splenectomy, adenocarcinoma with mucinous component, moderately differentiated, 0/16 nodes, negative margins,pT1pN0 Adjuvant FOLFIRINOX completed 02/22/2019 10/13/2023 CT Abdo/pelvis: Distended stomach, no evidence of recurrent tumor or distant metastatic disease 10/18/2023 EGD with stricture, status post a gastrojejunostomy 10/24/2023: Firm mass involving the head of the pancreas and duodenal bulb, serosal implant biopsy positive for adenocarcinoma-moderately differentiated, foundation 1-BRCA2 rearrangement, KRAS G 12V 11/08/2023 CTs at [facility]: Unchanged tumor in the anterior pancreas neck inseparable from the duodenal bulb, new hepatic segment 4 subcapsular lesion increased size and conspicuity of subcentimeter peripancreatic/anterior central mesenteric nodes 11/25/2023: Cycle 1 NALIRIFOX 12/13/2023: Cycle 2 NALIRIFOX, oxaliplatin  further dose reduced and irinotecan held, G-CSF added 01/03/2024: Cycle 3 NALIRIFOX, irinotecan held, G -CSF 01/17/2024: Cycle 4 NALIRIFOX, irinotecan held, G-CSF 01/27/2024 CTs at [facility]: Decrease of  the ill-defined mass between the anterior pancreas head/neck and posterior duodenal bulb, previous hypodense posterior segment 4 lesion not visualized 02/14/2024: Cycle 5 NALIRIFOX, irinotecan was held,  G-CSF 02/28/2024: Cycle 6 NALIRIFOX, irinotecan held, G-CSF 03/13/2024: Cycle 7 NALIRIFOX, irinotecan held, G-CSF 03/26/2024: Cycle 8 NALIRIFOX, irinotecan held, G-CSF 04/02/2024 CTs: Right pulmonary embolism, soft tissue between the anterior aspect of the pancreas head/neck and distal stomach-unchanged, no retroperitoneal or mesenteric lymphadenopathy 04/23/2024 - 05/04/2024-pancreas radiation with concurrent capecitabine (capecitabine discontinued after 4 days due to nausea and vomiting) 05/14/2024: Segment 4 liver biopsy-liver parenchyma with fibrosis, pancreas and duodenum: 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  Tumor directly extends to the duodenal wall, peripancreatic soft tissue, and mesenteric adipose tissue, perineural invasion present, partial tumor response score 2, resection margins negative, 0.3 cm to the retroperitoneal/SMA margin, pT2, PN 0  2.   Admission 11/29/2023 with intractable nausea/vomiting 3.   Persistent nausea, diarrhea, and cramping abdominal pain following cycle 1 NALIRIFOX 4.   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   01/05/2024 germline CancerNext expanded panel: Negative 6.   04/02/2024 pulmonary embolism right main pulmonary artery extending to right middle lobe and right lower lobe branches-heparin , discharged 04/03/2024 on apixaban  04/02/2024 bilateral lower extremity Dopplers: Negative for DVT 05/26/2024 CT chest: Decreased pulmonary embolism in the right lung since 04/02/2024 7.  Post pancreatectomy diabetes     Disposition: Alyssa Harper underwent a Whipple procedure in [place] on 05/14/2024.  She is recovering from surgery.  She has developed post pancreatectomy diabetes.  She is on insulin and is scheduled to  see endocrinology tomorrow.  She has nausea and abdominal discomfort following the pancreaticoduodenectomy.  She will receive intravenous fluids and Zofran  at the Cancer center today and tomorrow.  She continues apixaban  anticoagulation for the pulmonary embolism.  The pancreas resection specimen revealed a pancreas head mass that appears to be a new primary pancreas tumor as opposed to a metastasis or local recurrence of the pancreas body mass from 2020.  I see no indication for adjuvant chemotherapy or radiation.  We will consider PARP maintenance based on the BRCA2 alteration in the April 2025 biopsy.  She will return for an office visit next week.  Arley Hof, MD  06/04/2024  3:17 PM

## 2024-06-04 NOTE — Progress Notes (Signed)
 Pt sent over from clinic side for a 500cc bolus of NS. Phlebotomist came to draw labs peripherally due to lack of sufficient blood return from port (there was blood return just not enough for lab tubes).

## 2024-06-04 NOTE — Telephone Encounter (Signed)
 Patient's spouse, Alyssa Harper, is calling to say that patient just had surgery (3 weeks ago in [place], [place]) and due to high blood sugar levels would like an appointment.  Alyssa Harper states that Alyssa Harper's blood sugar level is staying in the high 200's since her surgery.   Friday 06/01/2024 highest was 294 at 10:00 AM Saturday 06/02/2024 they traveled back from [place], [place] & blood sugar levels ranged from 130-260. Sunday 06/03/2024 in the morning it was 260 & stayed in the 200's all day.+ Monday 06/04/2024 at 9:02 AM it was 259.  Alyssa Harper states that he knows Dr. Sam is out of town, but feels Alyssa Harper needs to be seen as soon as possible by someone.

## 2024-06-04 NOTE — Patient Instructions (Signed)

## 2024-06-04 NOTE — Telephone Encounter (Signed)
 Please see numerous messages regarding this.  Patient is aware there is nothing available.

## 2024-06-05 ENCOUNTER — Encounter: Payer: Self-pay | Admitting: Internal Medicine

## 2024-06-05 ENCOUNTER — Inpatient Hospital Stay

## 2024-06-05 ENCOUNTER — Ambulatory Visit: Admitting: Internal Medicine

## 2024-06-05 VITALS — BP 120/70 | HR 79 | Ht 65.0 in | Wt 122.0 lb

## 2024-06-05 DIAGNOSIS — Z90410 Acquired total absence of pancreas: Secondary | ICD-10-CM

## 2024-06-05 DIAGNOSIS — E891 Postprocedural hypoinsulinemia: Secondary | ICD-10-CM | POA: Insufficient documentation

## 2024-06-05 DIAGNOSIS — E7849 Other hyperlipidemia: Secondary | ICD-10-CM

## 2024-06-05 DIAGNOSIS — E139 Other specified diabetes mellitus without complications: Secondary | ICD-10-CM

## 2024-06-05 LAB — CANCER ANTIGEN 19-9: CA 19-9: 27 U/mL (ref 0–35)

## 2024-06-05 NOTE — Patient Instructions (Addendum)
 Please use the following regimen: - Lantus 6 units daily - NovoLog based 3-4x a day 15 min before meals - Insulin to carb ratio: 1:15 - add the following Sliding scale of NovoLog: 150-200: + 1 unit 201-250: + 2 units 251-300: + 3 units >300: + 4 units  NO SWEET DRINKS!  Please return in 2 weeks to see Dr. Sam, as already scheduled.  Basic Rules for Patients with Type I Diabetes Mellitus  The American Diabetes Association (ADA) recommended targets: - fasting sugar 80-130 - after meal sugar <180 - HbA1C <7%  Engage in >=150 min moderate exercise per week  Make sure you have >=8h of sleep every night as this helps both blood sugars and your weight.  Always keep a sugar log (not only record in your meter) and bring it to all appointments with us .  15-15 rule for hypoglycemia: if sugars are low, take 15 g of carbs** (fast sugar - e.g. 4 glucose tablets, 4 oz orange juice), wait 15 min, then check sugars again. If still <80, repeat. Continue  until your sugars >80, then eat a normal meal.   Teach family members and coworkers to inject glucagon. Have a glucagon set at home and one at work. They should call 911 after using the set.  Check sugar before driving. If <100, correct, and only start driving if sugars rise >=899. Check sugar every hour when on a long drive.  Check sugar before exercising. If <100, correct, and only start exercising if sugars rise >=100. Check sugar every hour when on a long exercise routine and 1h after you finished exercising.   If >250, check urine for ketones. If you have moderate-large ketones in urine, do not start exercise. Hydrate yourself with clear liquids and correct the high sugar. Recheck sugars and ketones before attempting to exercise.  Be aware that you might need less insulin when exercising.  *intense, short, exercise bursts can increase your sugars, but  *less intense, longer (>1h), exercise routines can decrease your sugars.    Make sure you have a MedAlert bracelet or pendant mentioning Type I Diabetes Mellitus. If you have a prior episode of severe hypoglycemia or hypoglycemia unawareness, it should also mention this.  Please do not walk barefoot. Inspect your feet for sores/cuts and let us  know if you have them.  **E.g. of fast carbs: first choice (15 g):  1 tube glucose gel, GlucoPouch 15, 2 oz glucose liquid second choice (15-16 g):  3 or 4 glucose tablets (best taken  with water), 15 Dextrose  Bits chewable third choice (15-20 g):   cup fruit juice,  cup regular soda, 1 cup skim milk,  1 cup sports drink fourth choice (15-20 g):  1 small tube [place] gel (not frosting), 2 tbsp raisins, 1 tbsp table sugar,  candy, jelly beans, gum drops - check package for carb amount   (adapted from: Jannetta BROCHURE. Insulin therapy and hypoglycemia Endocrinol Metab Clin N Am 2012, 41: 57-87)  Sick Day Rules for Diabetes  Think S-K-I-L-L:  Sugars:  - if glucose >200, check every 3h and drink sugar free liquids  - if glucose <200, drink carb-containing liquids and recheck 30 min later  - if glucose high, correct with insulin  - if sugars <60, initiate hypoglycemia management (take 15 g of fast carbs and check sugars in 15 min  - repeat until sugars remain >100).  Ketones:  When to check ketones?  When glucose >300 x2 if on insulin injections  When nausea,  vomiting, diarrhea, abdominal pain, headache, fever - even if glucose is normal or low - because in this case, you need both glucose and insulin.    - if you have ketone strips for blood >> if ketones are more or equal than 0.6, need to increase insulin - if you have ketone strips for urine >> if ketones are more or equal than small, need to increase insulin  Insulin: Never skip long acting insulin, even if not eating!   Urine ketones Blood ketones Extra insulin?  no <0.6 no  small 0.6-1.5 Increase dose by 5%  moderate 1.5-3 Increase dose by  10%  large >3 Increase dose by at least 20%   Liquids: - if glucose >200, check every 3h and drink sugar free liquids  - if glucose <200, small sips of carb-containing liquids (e.g. Ginger ale, Gatorade, juice, etc.)  Let us  know!   Call us  if: Go to ED if: Call your primary care doctor if:  Sugars >300 for >8h Severe abdominal pain Fever >100F for 24h  Moderate to large  urine ketones or blood ketones >1.5 Difficulty breathing Other chronic diseases flaring up  Vomiting and unable to keep liquids down Signs of dehydration

## 2024-06-05 NOTE — Progress Notes (Signed)
 Patient ID: Alyssa Harper, female   DOB: 10-31-1960, 63 y.o.   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  She is a patient of Dr. Kris (out of the office now), but she had to be seen urgently after the above procedure and recent insulin requirements.  Last visit with Dr. Sam was 3 months ago.  She has another appointment with her in 2 weeks. She is here with her husband who offers part of the history.  Patient has a history of pancreatic cancer diagnosed in 10/2023, for which she had Whipple procedure on 05/14/2024 at [facility] in [place] (removed 100% pancreas reportedly). No plans for ChTx for now.   She has dizziness, pressure in her abdomen and chest, some nausea.  Towards the end of the visit, we had her laying down on the examiners table.  Per husband's report, this has been happening daily since her surgery.  Reviewed HbA1c levels: 05/02/2024: 5.8% Lab Results  Component Value Date   HGBA1C 5.3 07/04/2018   She is on the following regimen: - Lantus 10 >> 8 >> 4-5 units daily in am - NovoLog based on sliding scale 2-3 units 5-6x a day AFTER meals   She would be interested in an insulin pump in the future.  Meter: AccuChek guide me  Pt checks her sugars with her Dexcom G7 CGM:  Lowest sugar was 40s; she has hypoglycemia awareness at 70.  She has a glucagon kit at home. No hypoglycemia admissions.  Highest sugar was 300 >> 260. No DKA admissions.    Pt's meals are: - Breakfast: 1/4 cup yoghurt, cheerios, blueberries, walnuts - 2nd B'fast: 1 scrambled egg, 1/2 Hawaiian role - Lunch: sandwich with lettuce + mayo + turkey + cheese, dessert - snack: 2 graham crackers - Dinner: 1-2 oz steak, sweet potato, peas and carrots - Snack: 1 slice of apple with PB - snack: 2 PB cracker, 2 oz milk  - no CKD, last BUN/creatinine:  Lab Results  Component Value Date   BUN 9  06/04/2024   BUN 14 03/26/2024   CREATININE 0.65 06/04/2024   CREATININE 0.65 03/26/2024   No results found for: MICRALBCREAT  - + history of HL; last set of lipids: Lab Results  Component Value Date   CHOL 162 07/04/2018   HDL 66 07/04/2018   LDLCALC 84 07/04/2018   TRIG 62 07/04/2018   CHOLHDL 2.5 07/04/2018  She is not on a statin.  - no numbness and tingling in her feet.  Last TSH: Lab Results  Component Value Date   TSH 0.809 07/04/2018   Pt has no FH of DM.  She also has a history of Sjogren syndrome, MDD, headaches.  ROS: + See HPI  Past Medical History:  Diagnosis Date   Family history of adverse reaction to anesthesia    grandmother stopped breathing   Headache    weekly (07/03/2018)   High cholesterol    Mitral valve prolapse    Pancreatic mass 07/03/2018   Sjogren's disease    Past Surgical History:  Procedure Laterality Date   AUGMENTATION MAMMAPLASTY Bilateral 2013   ESOPHAGOGASTRODUODENOSCOPY N/A 07/05/2018   Procedure: ESOPHAGOGASTRODUODENOSCOPY (EGD);  Surgeon: Burnette Fallow, MD;  Location: [facility];  Service: Endoscopy;  Laterality: N/A;   EUS N/A 07/05/2018   Procedure: UPPER ENDOSCOPIC ULTRASOUND (EUS) LINEAR and Radial;  Surgeon: Burnette Fallow, MD;  Location: [facility];  Service: Endoscopy;  Laterality:  N/A;   FINE NEEDLE ASPIRATION N/A 07/05/2018   Procedure: FINE NEEDLE ASPIRATION (FNA) LINEAR;  Surgeon: Burnette Fallow, MD;  Location: [facility];  Service: Endoscopy;  Laterality: N/A;   Social History   Socioeconomic History   Marital status: Married    Spouse name: Not on file   Number of children: Not on file   Years of education: Not on file   Highest education level: Not on file  Occupational History   Not on file  Tobacco Use   Smoking status: Never   Smokeless tobacco: Never  Vaping Use   Vaping status: Never Used  Substance and Sexual Activity   Alcohol use: Yes    Alcohol/week: 7.0 standard drinks of  alcohol    Types: 7 Standard drinks or equivalent per week    Comment: glass of wine per night   Drug use: Never   Sexual activity: Yes  Other Topics Concern   Not on file  Social History Narrative   ** Merged History Encounter **       Social Drivers of Health   Tobacco Use: Low Risk (06/05/2024)   Patient History    Smoking Tobacco Use: Never    Smokeless Tobacco Use: Never    Passive Exposure: Not on file  Financial Resource Strain: Low Risk  (04/05/2024)   Received from [facility]   Overall Financial Resource Strain (CARDIA)    Difficulty of Paying Living Expenses: Not hard at all  Food Insecurity: Low Risk  (05/28/2024)   Received from [facility]   Food    Within the past 6 months we worried whether our food would run out before we got money to buy more.: Never True    Within the past 6 months the food we bought just didn't last and we didn't have enough money to get more.: Never True  Transportation Needs: Low Risk  (05/28/2024)   Received from [employer] [facility]   Transportation    Has the lack of transportation kept you from medical appointments or from getting medications?: No  Physical Activity: Not on file  Stress: Not on file  Social Connections: Not on file  Intimate Partner Violence: Unknown (05/26/2024)   Received from [facility]   Intimate Partner Violence    Are you denied basic needs such as food, clothing, or medical care?: Deferred    In the past 12 months have you been in a relationship with a person who hurts, threatens, or tries to control you?: Deferred    Are you denied basic needs such as food, clothing, or medical care?: Deferred    In the past 12 months have you been in a relationship with a person who hurts, threatens, or tries to control you?: Deferred  Depression (PHQ2-9): Low Risk (06/04/2024)   Depression (PHQ2-9)    PHQ-2 Score: 0  Alcohol Screen: Not on file  Housing: Low Risk  (04/02/2024)    Received from [facility]   Epic    In the last 12 months, was there a time when you were not able to pay the mortgage or rent on time?: No    In the past 12 months, how many times have you moved where you were living?: 0    At any time in the past 12 months, were you homeless or living in a shelter (including now)?: No  Utilities: Low Risk  (05/28/2024)   Received from [facility]   Paying Utility  Bills    Do you have trouble paying your heating or electricity bill?: No  Health Literacy: Adequate Health Literacy (11/08/2023)   Received from [facility]   B1300 Health Literacy    Frequency of need for help with medical instructions: Rarely   Medications Ordered Prior to Encounter[1] Allergies[2] History reviewed. No pertinent family history.  PE: BP 120/70   Pulse 79   Ht 5' 5 (1.651 m)   Wt 122 lb (55.3 kg)   SpO2 96%   BMI 20.30 kg/m  Wt Readings from Last 3 Encounters:  06/05/24 122 lb (55.3 kg)  06/04/24 121 lb 1.6 oz (54.9 kg)  05/08/24 123 lb 6.4 oz (56 kg)   Constitutional: normal weight, in distress because of dizziness. Eyes:  EOMI, no exophthalmos ENT: no neck masses, no cervical lymphadenopathy Cardiovascular: RRR, No MRG Respiratory: CTA B Musculoskeletal: no deformities Skin:no rashes Neurological: no tremor with outstretched hands  ASSESSMENT: 1.  Type IIIc diabetes, uncontrolled, with hyperglycemia  2. HL  PLAN:  1. Patient with recent diagnosis of type IIIc diabetes, after Whipple procedure with pancreatectomy for pancreatic cancer.  She is currently on basal/bolus insulin regimen. CGM Interpretation: -At today's visit, we reviewed her CGM downloads: It appears that 67% of values are in target range (goal >70%), while 33% are higher than 180 (goal <25%), and 0% are lower than 70 (goal <4%).  The calculated average blood sugar is 158.  The projected HbA1c for the next 3 months (GMI) is  7.1%. -Reviewing the CGM trends, sugars appear to be fluctuating rapidly within the target range overnight and they do increase and fluctuate around the upper limit of the target range, 180 mg/dL after she starts eating.  She is having small meals, 5-6 a day or more as she was not tolerating larger amounts after her surgery.  She has some nausea but overall been able to eat.  She decreased the dose of her basal insulin since discharge from the hospital now taking between 4 and 5 units.  We discussed that 4 units are definitely not enough, and 5 may also not be enough.  I actually recommended to increase the dose to 6 units for now and she may need more depending on the future patterns.  At today's visit we also discussed about how to dose her NovoLog, 15 minutes before meals (current she takes this quite sometime after the meals), help to guide the dose based on the size of the meals, and how to use the sliding scale.  She mentions that she would be comfortable to do carb counting so I did give her an insulin to carb ratio of 1:15.  We discussed that this is just a starting point, she may need to adjust this depending on blood sugars.  We also discussed that eating small, frequent meals, while ok for now, as she is recovering from surgery, may not be ideal in the long run, due to the need to take short-acting insulin before the meals.  When she feels better, she can try to move the meals into 3-4 main meals a day and take the NovoLog before meals. -Patient was given the sliding scale in the hospital, but they feel (I agree) that this is too aggressive).  I gave him a more relaxed sliding scale.  Discussed about when to use the sliding scale (before meals and occasionally after meals). -At today's visit we also addressed other issues for example the fact that proteins can also  increase the blood sugars longer after meals, we discussed about possible compression lows from the sensor, also, when correcting hypo or  hyperglycemia, to follow the blood sugars with the glucometer, rather than the CGM due to the approximate 15-20-minute delay between the sugars in the interstitial space compared to the blood.  We also demonstrated glucometer use and I advised him when to use it.  We reviewed indications for glucagon administration.  She does have inhaled glucagon at home.  I also strongly advised her to refrain from drinking juice and other sweet drinks unless her blood sugars are low. - I suggested to: Patient Instructions  Please use the following regimen: - Lantus 6 units daily - NovoLog based 3-4x a day 15 min before meals - Insulin to carb ratio: 1:15 - add the following Sliding scale of NovoLog: 150-200: + 1 unit 201-250: + 2 units 251-300: + 3 units >300: + 4 units  NO SWEET DRINKS!  Please return in 2 weeks to see Dr. Sam, as already scheduled.   - advised her to check sugars at different times of the day - check at least 4 times a day, rotating checks - given foot care handout  - given instructions for hypoglycemia management 15-15 rule  - advised to get ketone strips - advised to always have Glu tablets with her - given instruction Re: exercising and driving in DM1 (pt instructions) - also, given information about sick day rules - no signs of other autoimmune disorders - Return to clinic in 2 weeks for the appointment with Dr. Sam  2. HL - Reviewed latest lipid panel which was at goal: Lab Results  Component Value Date   CHOL 162 07/04/2018   HDL 66 07/04/2018   LDLCALC 84 07/04/2018   TRIG 62 07/04/2018   CHOLHDL 2.5 07/04/2018  -She is not on a statin.  This could be contemplated in the future, for cardiovascular risk reduction, now that she has a diagnosis of diabetes.  Patient was feeling dizzy at the beginning of the appointment.  We need to have her lay down on the examiners table towards the end of the appointment.  She did mention pressure in her abdomen and  chest.  I suggested possible ED evaluation, but the husband mentions that this is not something new, she has been having the symptoms ever since her surgery.  They declined going to the emergency room.  Patient was allowed to rest on the examiners table until she felt better, and the patient and her left home.  - time spent for the visit today: 40 minutes (outside CGM interpretation), in precharting, post charting, reviewing her previous labs, evaluations (in Epic and Care Everywhere), office visit notes, and previous treatments, counseling her and her husband about diagnosed diabetes (please see the discussed topics above), and developing a plan to improve her diabetes control. She and her husband had a number of questions which I addressed.    Lela Fendt, MD PhD [facility]      [1]  Current Outpatient Medications on File Prior to Visit  Medication Sig Dispense Refill   Continuous Glucose Sensor (DEXCOM G7 SENSOR) MISC 1 each by Other route.     Insulin Aspart FlexPen (NOVOLOG) 100 UNIT/ML Insulin dose Glucose 70-150: 0 unit. Glucose 151-200: 2 units. Glucose 201-250: 4 units. Glucose 251-300: 6 units. Glucose 301-350: 8 units. Glucose 351-400: 10 units. Glucose >400: 12 units and call Endocrinologist     LANTUS SOLOSTAR 100 UNIT/ML Solostar Pen Inject 6  Units into the skin.     LORazepam  (ATIVAN ) 0.5 MG tablet Place 1 tablet (0.5 mg total) under the tongue every 6 (six) hours as needed (nausea). 30 tablet 1   neomycin-polymyxin-dexameth (MAXITROL) 0.1 % OINT Place 1 Application into the left eye 2 (two) times daily as needed (for irritation).     polyethylene glycol (MIRALAX  / GLYCOLAX ) 17 g packet Take 17 g by mouth daily as needed.     ALPRAZolam  (XANAX ) 0.25 MG tablet Take 1 tablet (0.25 mg total) by mouth 2 (two) times daily as needed for anxiety. Do not take if taking lorazepam  (Patient not taking: Reported on 06/04/2024) 30 tablet 0   apixaban  (ELIQUIS ) 5 MG TABS tablet  Take 1 tablet (5 mg total) by mouth 2 (two) times daily. 60 tablet 5   CREON  24000-76000 units CPEP Take 1 capsule (24,000 Units total) by mouth 3 (three) times daily with meals as needed (to aid food digestion- as determined by the patient). 180 capsule 1   famotidine  (PEPCID ) 40 MG tablet Take 40 mg by mouth 2 (two) times daily before a meal.     glucose blood (ONETOUCH VERIO) test strip 1 each by Other route daily in the afternoon. Use as instructed 100 each 12   lidocaine -prilocaine  (EMLA ) cream Apply 1 Application topically as needed (for port access). 30 g 2   magic mouthwash SOLN Take 5 mLs by mouth 4 (four) times daily as needed for mouth pain. Suspension contains equal amounts of Maalox Extra Strength, nystatin, and diphenhydramine . (Patient not taking: Reported on 06/04/2024) 240 mL 0   ondansetron  (ZOFRAN ) 8 MG tablet Take 1 tablet (8 mg total) by mouth every 8 (eight) hours as needed for nausea or vomiting. 30 tablet 2   oxyCODONE  (OXY IR/ROXICODONE ) 5 MG immediate release tablet Take 2.5-5 mg by mouth every 4 (four) hours as needed (for pain).     pantoprazole  (PROTONIX ) 40 MG tablet Take 1 tablet (40 mg total) by mouth daily. 90 tablet 1   promethazine  (PHENERGAN ) 12.5 MG tablet Take 12.5 mg by mouth every 6 (six) hours as needed for nausea or vomiting. (Patient not taking: Reported on 06/04/2024)     TYLENOL  500 MG tablet Take 250 mg by mouth every 6 (six) hours as needed for mild pain (pain score 1-3) or headache.     No current facility-administered medications on file prior to visit.  [2]  Allergies Allergen Reactions   Tape Dermatitis and Other (See Comments)    IV3000 DRESSING FOR PORT ACCESS - Tegaderm cause irritation to the surrounding skin   Wound Dressing Adhesive Dermatitis and Other (See Comments)    IV3000 DRESSING FOR PORT ACCESS - Tegaderm cause irritation to the surrounding skin   Pollen Extract Other (See Comments)    Hayfever   Penicillins Rash

## 2024-06-06 ENCOUNTER — Encounter: Payer: Self-pay | Admitting: Internal Medicine

## 2024-06-07 ENCOUNTER — Encounter: Payer: Self-pay | Admitting: Oncology

## 2024-06-07 ENCOUNTER — Other Ambulatory Visit: Payer: Self-pay | Admitting: Nurse Practitioner

## 2024-06-07 ENCOUNTER — Ambulatory Visit (HOSPITAL_BASED_OUTPATIENT_CLINIC_OR_DEPARTMENT_OTHER)
Admission: RE | Admit: 2024-06-07 | Discharge: 2024-06-07 | Disposition: A | Source: Ambulatory Visit | Attending: Nurse Practitioner | Admitting: Nurse Practitioner

## 2024-06-07 ENCOUNTER — Encounter (HOSPITAL_BASED_OUTPATIENT_CLINIC_OR_DEPARTMENT_OTHER): Payer: Self-pay

## 2024-06-07 DIAGNOSIS — R0602 Shortness of breath: Secondary | ICD-10-CM

## 2024-06-07 DIAGNOSIS — I2609 Other pulmonary embolism with acute cor pulmonale: Secondary | ICD-10-CM

## 2024-06-07 DIAGNOSIS — M3500 Sicca syndrome, unspecified: Secondary | ICD-10-CM

## 2024-06-07 DIAGNOSIS — C259 Malignant neoplasm of pancreas, unspecified: Secondary | ICD-10-CM | POA: Diagnosis present

## 2024-06-07 MED ORDER — HEPARIN SOD (PORK) LOCK FLUSH 100 UNIT/ML IV SOLN: 500.0000 [IU] | Freq: Once | INTRAVENOUS | Status: DC

## 2024-06-07 MED ORDER — IOHEXOL 300 MG/ML  SOLN
75.0000 mL | Freq: Once | INTRAMUSCULAR | Status: AC | PRN
  Administered 2024-06-07: 11:00:00 75 mL via INTRAVENOUS

## 2024-06-07 NOTE — Progress Notes (Signed)
 Patient having significant shortness of breath and weakness. Sent for State CT scan given history of recent Whipple procedure, known pulmonary Emboli, and post surgical pneumonia.   CT scan results received and reviewed with Dr. Marvine. 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 Patient able to return home and team will maintain close contact for symptom management. Home oxygen set-up has been arranged and STAT referral to Pulmonology for ongoing collaborative management to be placed.   Patient knows to seek emergent medical assistance in the setting of worsening symptoms.

## 2024-06-08 ENCOUNTER — Encounter: Payer: Self-pay | Admitting: Internal Medicine

## 2024-06-08 MED ORDER — METOCLOPRAMIDE HCL 5 MG PO TABS: 5.0000 mg | ORAL_TABLET | Freq: Three times a day (TID) | ORAL | 1 refills | Status: AC | PRN

## 2024-06-11 ENCOUNTER — Other Ambulatory Visit: Payer: Self-pay

## 2024-06-11 ENCOUNTER — Inpatient Hospital Stay: Admitting: Oncology

## 2024-06-11 ENCOUNTER — Encounter: Payer: Self-pay | Admitting: Oncology

## 2024-06-11 ENCOUNTER — Other Ambulatory Visit: Payer: Self-pay | Admitting: Oncology

## 2024-06-11 MED ORDER — APIXABAN 5 MG PO TABS: 5.0000 mg | ORAL_TABLET | Freq: Two times a day (BID) | ORAL | 5 refills | Status: AC

## 2024-06-11 MED ORDER — EMBECTA PEN NEEDLE ULTRAFINE 31G X 5 MM MISC: 1.0000 | Freq: Four times a day (QID) | 2 refills | Status: DC

## 2024-06-12 ENCOUNTER — Other Ambulatory Visit: Payer: Self-pay | Admitting: Oncology

## 2024-06-12 MED ORDER — OXYCODONE HCL 5 MG PO TABS: 2.5000 mg | ORAL_TABLET | ORAL | 0 refills | Status: AC | PRN

## 2024-06-22 ENCOUNTER — Encounter: Payer: Self-pay | Admitting: Internal Medicine

## 2024-06-22 ENCOUNTER — Ambulatory Visit: Admitting: Internal Medicine

## 2024-06-22 VITALS — BP 120/78 | Ht 65.0 in | Wt 116.0 lb

## 2024-06-22 DIAGNOSIS — E139 Other specified diabetes mellitus without complications: Secondary | ICD-10-CM

## 2024-06-22 DIAGNOSIS — E891 Postprocedural hypoinsulinemia: Secondary | ICD-10-CM

## 2024-06-22 DIAGNOSIS — Z90410 Acquired total absence of pancreas: Secondary | ICD-10-CM | POA: Diagnosis not present

## 2024-06-22 LAB — POCT GLYCOSYLATED HEMOGLOBIN (HGB A1C): Hemoglobin A1C: 6.9 % — AB (ref 4.0–5.6)

## 2024-06-22 NOTE — Progress Notes (Signed)
 " Name: Alyssa Harper  MRN/ DOB: [id number], [birth year]   Age/ Sex: 64 y.o., female    PCP: Tisovec, Charlie ORN, MD   Reason for Endocrinology Evaluation: Pancreatic adenocarcinoma     Date of Initial Endocrinology Visit: 03/20/2024    PATIENT IDENTIFIER: Alyssa Harper is a 64 y.o. female with a past medical history of pancreatic adenocarcinoma. The patient presented for initial endocrinology clinic visit on 03/20/2024 for consultative assistance with her diabetes management.    HPI: Alyssa Harper was referred here by oncology   The patient was diagnosed with pancreatic adenocarcinoma, she has failed s/p distal pancreatectomy+ splenectomy in 2020 at [facility].  She did receive chemotherapy through September 2020.  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  She underwent palliative open gastrojejunostomy in May, 2025 which demonstrated a firm mass involving the pancreas and the duodenal bulb, with biopsy significant for moderately differentiated adenocarcinoma.  She completed 4 chemo cycles from June to July, 2025   She underwent Whipple procedure on 05/10/2024 at [facility] in [place] .  And was started on basal/prandial insulin    SUBJECTIVE:   During the last visit (06/05/2024): Was seen by Dr. Trixie  Today (06/22/2024): Alyssa Harper is here for follow-up on diabetes management.  She checks blood sugars multiple  times daily through CGM. The patient has had hypoglycemic episodes since the last clinic visit.  She is symptomatic with these episodes   She is accompanied by spouse today She has a pending follow-up appointment with pulmonary for pulmonary embolism she does have shortness of breath, she continues to endorse episodes of weakness and dizziness No nausea, she has been off antiemetics for approximately 3 weeks She has been eating small frequent meals Has occasional  abdominal pain No constipation diarrhea Has also noted bilateral flank pains She also has noted pins-and-needles of the feet     HOME DIABETES REGIMEN:  Lantus  6 units daily NovoLog  1:15 TID CF: Novolog  (BG-120/25)  Prior Diabetic Education: yes   CONTINUOUS GLUCOSE MONITORING RECORD INTERPRETATION    Dates of Recording: 12/20-06/23/2023  Sensor description:dexcom  Results statistics:   CGM use % of time 94  Average and SD 192/40  Time in range    39    %  % Time Above 180 55  % Time above 250 6  % Time Below target 0   Glycemic patterns summary:   Hyperglycemic episodes    Hypoglycemic episodes occurred  Overnight periods:      DIABETIC COMPLICATIONS: Microvascular complications:   Denies:  Last Eye Exam: Completed   Macrovascular complications:   Denies: CAD, CVA, PVD    PAST HISTORY: Past Medical History:  Past Medical History:  Diagnosis Date   Family history of adverse reaction to anesthesia    grandmother stopped breathing   Headache    weekly (07/03/2018)   High cholesterol    Mitral valve prolapse    Pancreatic mass 07/03/2018   Sjogren's disease    Past Surgical History:  Past Surgical History:  Procedure Laterality Date   AUGMENTATION MAMMAPLASTY Bilateral 2013   ESOPHAGOGASTRODUODENOSCOPY N/A 07/05/2018   Procedure: ESOPHAGOGASTRODUODENOSCOPY (EGD);  Surgeon: Burnette Fallow, MD;  Location: [facility];  Service: Endoscopy;  Laterality: N/A;   EUS N/A 07/05/2018   Procedure: UPPER ENDOSCOPIC ULTRASOUND (EUS) LINEAR and Radial;  Surgeon: Burnette Fallow, MD;  Location: [facility];  Service: Endoscopy;  Laterality: N/A;   FINE  NEEDLE ASPIRATION N/A 07/05/2018   Procedure: FINE NEEDLE ASPIRATION (FNA) LINEAR;  Surgeon: Burnette Fallow, MD;  Location: [facility];  Service: Endoscopy;  Laterality: N/A;    Social History:  reports that she has never smoked. She has never used smokeless tobacco. She reports current alcohol use of  about 7.0 standard drinks of alcohol per week. She reports that she does not use drugs. Family History: No family history on file.   HOME MEDICATIONS: Allergies as of 06/22/2024       Reactions   Tape Dermatitis, Other (See Comments)   IV3000 DRESSING FOR PORT ACCESS - Tegaderm cause irritation to the surrounding skin   Wound Dressing Adhesive Dermatitis, Other (See Comments)   IV3000 DRESSING FOR PORT ACCESS - Tegaderm cause irritation to the surrounding skin   Pollen Extract Other (See Comments)   Hayfever   Penicillins Rash        Medication List        Accurate as of June 22, 2024  2:50 PM. If you have any questions, ask your nurse or doctor.          ALPRAZolam  0.25 MG tablet Commonly known as: XANAX  Take 1 tablet (0.25 mg total) by mouth 2 (two) times daily as needed for anxiety. Do not take if taking lorazepam    apixaban  5 MG Tabs tablet Commonly known as: ELIQUIS  Take 1 tablet (5 mg total) by mouth 2 (two) times daily.   Creon  24000-76000 units Cpep Generic drug: Pancrelipase  (Lip-Prot-Amyl) Take 1 capsule (24,000 Units total) by mouth 3 (three) times daily with meals as needed (to aid food digestion- as determined by the patient).   Dexcom G7 Sensor Misc 1 each by Other route.   Embecta Pen Needle Ultrafine 31G X 5 MM Misc Generic drug: Insulin  Pen Needle 1 Needle by Does not apply route in the morning, at noon, in the evening, and at bedtime.   famotidine  40 MG tablet Commonly known as: PEPCID  Take 40 mg by mouth 2 (two) times daily before a meal.   Insulin  Aspart FlexPen 100 UNIT/ML Commonly known as: NOVOLOG  Insulin  dose Glucose 70-150: 0 unit. Glucose 151-200: 2 units. Glucose 201-250: 4 units. Glucose 251-300: 6 units. Glucose 301-350: 8 units. Glucose 351-400: 10 units. Glucose >400: 12 units and call Endocrinologist   Lantus  SoloStar 100 UNIT/ML Solostar Pen Generic drug: insulin  glargine Inject 6 Units into the skin.    lidocaine -prilocaine  cream Commonly known as: EMLA  Apply 1 Application topically as needed (for port access).   LORazepam  0.5 MG tablet Commonly known as: ATIVAN  Place 1 tablet (0.5 mg total) under the tongue every 6 (six) hours as needed (nausea).   magic mouthwash Soln Take 5 mLs by mouth 4 (four) times daily as needed for mouth pain. Suspension contains equal amounts of Maalox Extra Strength, nystatin, and diphenhydramine .   metoCLOPramide  5 MG tablet Commonly known as: Reglan  Take 1 tablet (5 mg total) by mouth every 8 (eight) hours as needed for nausea.   neomycin-polymyxin-dexameth 0.1 % Oint Commonly known as: MAXITROL Place 1 Application into the left eye 2 (two) times daily as needed (for irritation).   ondansetron  8 MG tablet Commonly known as: ZOFRAN  Take 1 tablet (8 mg total) by mouth every 8 (eight) hours as needed for nausea or vomiting.   OneTouch Verio test strip Generic drug: glucose blood 1 each by Other route daily in the afternoon. Use as instructed   oxyCODONE  5 MG immediate release tablet Commonly known as: Oxy IR/ROXICODONE  Take  0.5-1 tablets (2.5-5 mg total) by mouth every 4 (four) hours as needed (for pain).   pantoprazole  40 MG tablet Commonly known as: PROTONIX  Take 1 tablet (40 mg total) by mouth daily.   polyethylene glycol 17 g packet Commonly known as: MIRALAX  / GLYCOLAX  Take 17 g by mouth daily as needed.   promethazine  12.5 MG tablet Commonly known as: PHENERGAN  Take 12.5 mg by mouth every 6 (six) hours as needed for nausea or vomiting.   TYLENOL  500 MG tablet Generic drug: acetaminophen  Take 250 mg by mouth every 6 (six) hours as needed for mild pain (pain score 1-3) or headache.         ALLERGIES: Allergies  Allergen Reactions   Tape Dermatitis and Other (See Comments)    IV3000 DRESSING FOR PORT ACCESS - Tegaderm cause irritation to the surrounding skin   Wound Dressing Adhesive Dermatitis and Other (See Comments)     IV3000 DRESSING FOR PORT ACCESS - Tegaderm cause irritation to the surrounding skin   Pollen Extract Other (See Comments)    Hayfever   Penicillins Rash     REVIEW OF SYSTEMS: A comprehensive ROS was conducted with the patient and is negative except as per HPI     OBJECTIVE:   VITAL SIGNS: BP 120/78   Ht 5' 5 (1.651 m)   Wt 116 lb (52.6 kg)   BMI 19.30 kg/m    PHYSICAL EXAM:  General: Pt appears well and is in NAD  Neck: Thyroid : Thyroid  size normal.  No goiter or nodules appreciated.   Lungs: Clear with good BS bilat   Heart: RRR   Abdomen:  soft, nontender  Extremities:  Lower extremities - No pretibial edema.   Neuro: MS is good with appropriate affect, pt is alert and Ox3   Dm Foot Exam 06/26/2023  The skin of the feet is intact without sores or ulcerations. The pedal pulses are 2+ on right and 2+ on left. The sensation is intact to a screening 5.07, 10 gram monofilament bilaterally   DATA REVIEWED:  Lab Results  Component Value Date   HGBA1C 6.9 (A) 06/22/2024   HGBA1C 5.3 07/04/2018    Latest Reference Range & Units 03/12/24 07:51  Sodium 135 - 145 mmol/L 140  Potassium 3.5 - 5.1 mmol/L 4.7  Chloride 98 - 111 mmol/L 104  CO2 22 - 32 mmol/L 27  Glucose 70 - 99 mg/dL 865 (H)  BUN 8 - 23 mg/dL 11  Creatinine 9.55 - 8.99 mg/dL 9.28  Calcium  8.9 - 10.3 mg/dL 9.5  Anion gap 5 - 15  10  Alkaline Phosphatase 38 - 126 U/L 190 (H)  Albumin 3.5 - 5.0 g/dL 4.2  AST 15 - 41 U/L 32  ALT 0 - 44 U/L 27  Total Protein 6.5 - 8.1 g/dL 6.7  Total Bilirubin 0.0 - 1.2 mg/dL 0.3  GFR, Est Non African American >60 mL/min >60      ASSESSMENT / PLAN / RECOMMENDATIONS:   1) Type 1 Diabetes Mellitus, Optimally controlled, With out complications - Most recent A1c of 6.9 %. Goal A1c < 7.0 %.    -This is a new diagnosis following pancreatectomy - She has been eating 3 meals a day as well as in between the meals for nutritional purposes.  We did discuss the importance of  taking NovoLog  before each meal, I did encourage the patient to try to separate NovoLog  intake by 3-4 hours to prevent insulin  stacking which can lead to hypoglycemia - I  have also recommended trying to avoid CHO intake during the snack/in between meal eatings to avoid hyperglycemia and the need for NovoLog  - I will also change her sensitivity factor as she has been noted with hypoglycemia with correction - I will also increase her basal insulin  as below - We discussed insulin  pump technology including tandem t:slim, tandem MOBI, as well as the bionic islet pump - She is going to [place]  in the next few weeks and will return in March.  Will postpone the process of starting until her return in March  MEDICATIONS: Increase Lantus  8 units daily Change NovoLog  I:C ratio of 1:14 Novolog  : BG -120/40  EDUCATION / INSTRUCTIONS: BG monitoring instructions: Patient is instructed to check her blood sugars 3 times a day, before meals. Call [facility] if: BG persistently < 70  I reviewed the Rule of 15 for the treatment of hypoglycemia in detail with the patient. Literature supplied.    2) Diabetic complications:  Eye: Does not have known diabetic retinopathy.  Neuro/ Feet: Does not have known diabetic peripheral neuropathy .  Renal: Patient does not have known baseline CKD. She  is not on an ACEI/ARB at present.   Follow-up July 02, 2024  Signed electronically by: Stefano Redgie Butts, MD  [facility]  [facility] [address] [place] [place], [place] [address] Phone: [phone] FAX: [phone]   CC: Vernadine Charlie ORN, MD [address] [place] [address] Phone: [phone]  Fax: [phone]    Return to Endocrinology clinic as below: Future Appointments  Date Time Provider Department Center  06/26/2024  3:00 PM Theophilus Roosevelt, MD [facility] [address]  06/29/2024 10:20 AM Cloretta Arley NOVAK, MD [facility] None  06/29/2024  11:15 AM Knox Leita CROME, RD [facility]DM  07/02/2024  2:20 PM Breanne Olvera, Donell Redgie, MD [facility] None     "

## 2024-06-22 NOTE — Patient Instructions (Addendum)
 Lantus 8 units daily  Novolog insulin to carb ratio 1:14 with each other meal  Novolog correctional insulin: ADD extra units on insulin to your meal-time Novolog dose if your blood sugars are higher than 160. Use the scale below to help guide you:   Blood sugar before meal Number of units to inject  Less than 160 0 unit  161 -  200 1 units  201 -  240 2 units  241 -  280 3 units  281 -  320 4 units   Check out insulin pump to include Tandem, Mobi, iLet pump   HOW TO TREAT LOW BLOOD SUGARS (Blood sugar LESS THAN 70 MG/DL) Please follow the RULE OF 15 for the treatment of hypoglycemia treatment (when your (blood sugars are less than 70 mg/dL)   STEP 1: Take 15 grams of carbohydrates when your blood sugar is low, which includes:  3-4 GLUCOSE TABS  OR 3-4 OZ OF JUICE OR REGULAR SODA OR ONE TUBE OF GLUCOSE GEL    STEP 2: RECHECK blood sugar in 15 MINUTES STEP 3: If your blood sugar is still low at the 15 minute recheck --> then, go back to STEP 1 and treat AGAIN with another 15 grams of carbohydrates.

## 2024-06-24 ENCOUNTER — Encounter: Payer: Self-pay | Admitting: Oncology

## 2024-06-25 MED ORDER — INSULIN ASPART FLEXPEN 100 UNIT/ML ~~LOC~~ SOPN: PEN_INJECTOR | SUBCUTANEOUS | 3 refills | Status: AC

## 2024-06-25 MED ORDER — DEXCOM G7 SENSOR MISC: 1.0000 | 3 refills | Status: AC

## 2024-06-25 MED ORDER — INSULIN PEN NEEDLE 32G X 4 MM MISC: 1.0000 | Freq: Four times a day (QID) | 3 refills | Status: AC

## 2024-06-25 MED ORDER — LANTUS SOLOSTAR 100 UNIT/ML ~~LOC~~ SOPN: 8.0000 [IU] | PEN_INJECTOR | Freq: Every day | SUBCUTANEOUS | 4 refills | Status: DC

## 2024-06-26 ENCOUNTER — Ambulatory Visit: Admitting: Pulmonary Disease

## 2024-06-26 ENCOUNTER — Telehealth: Payer: Self-pay

## 2024-06-26 ENCOUNTER — Encounter: Payer: Self-pay | Admitting: Pulmonary Disease

## 2024-06-26 VITALS — BP 108/65 | HR 80 | Temp 98.4°F | Ht 65.0 in | Wt 119.0 lb

## 2024-06-26 DIAGNOSIS — J181 Lobar pneumonia, unspecified organism: Secondary | ICD-10-CM

## 2024-06-26 DIAGNOSIS — I2699 Other pulmonary embolism without acute cor pulmonale: Secondary | ICD-10-CM | POA: Diagnosis not present

## 2024-06-26 DIAGNOSIS — Z7901 Long term (current) use of anticoagulants: Secondary | ICD-10-CM

## 2024-06-26 DIAGNOSIS — J189 Pneumonia, unspecified organism: Secondary | ICD-10-CM | POA: Diagnosis not present

## 2024-06-26 NOTE — Progress Notes (Unsigned)
 "              Alyssa Harper    [id number]    [birth year]  Primary Care Physician:Tisovec, Charlie ORN, MD  Referring Physician: Tisovec, Richard W, MD [address] [place],  [place] [address]  Chief complaint: Follow-up for pulmonary embolism, post pneumonia hospitalization.  HPI: 64 y.o. who  has a past medical history of Family history of adverse reaction to anesthesia, Headache, High cholesterol, Mitral valve prolapse, Pancreatic mass (07/03/2018), and Sjogren's disease.  Discussed the use of AI scribe software for clinical note transcription with the patient, who gave verbal consent to proceed.  History of Present Illness Alyssa Harper is a 64 year old female with pancreatic cancer and pulmonary embolism who presents with shortness of breath and fatigue. She is accompanied by her husband, [name]. She was referred by Dr. Marvine for evaluation of her pulmonary condition.  Dyspnea and fatigue - Exertional dyspnea and easy fatigability persist following recent hospitalization and surgery. - Uncertain if symptoms are due to surgical recovery or ongoing pulmonary pathology. - Some improvement in breathing since hospitalization, but symptoms remain present.  Pulmonary embolism and anticoagulation - Pulmonary embolism diagnosed in October 2025 at [facility]. - Eliquis  initiated for anticoagulation, briefly held for surgery at [facility] and restarted postoperatively. - Concern regarding bleeding risk with continued Eliquis  use. - Possible left lung pulmonary embolism or effusion postoperatively.  Postoperative pulmonary complications - Bilateral pneumonia developed postoperatively. - Readmission for shortness of breath and fatigue following surgery. - Multiple chest X-rays and CT scans performed during hospitalization.  Pancreatic cancer history and surgical interventions - Pancreatic cancer diagnosed in 2020. - Initial treatment included distal resection and chemotherapy. - Total  pancreatectomy performed for new pancreatic primary.  Environmental and tobacco exposures - Lifelong non-smoker. - No known exposure to radon, lead, mold, or other lung irritants. - No known home environmental or occupational lung exposures. - Mother was a heavy smoker. - Has a dog.  Relevant Pulmonary history: Pets: Dog Occupation: Judge Exposures: No mold, hot tub, Jacuzzi.  No feather pillow comforter No h/o chemo/XRT/amiodarone/macrodantin/MTX  No exposure to asbestos, silica or other organic allergens  Smoking history: Never smoker Travel history: From [place] .  No significant recent travel Family history: No family history of lung disease   Outpatient Encounter Medications as of 06/26/2024  Medication Sig   ALPRAZolam  (XANAX ) 0.25 MG tablet Take 1 tablet (0.25 mg total) by mouth 2 (two) times daily as needed for anxiety. Do not take if taking lorazepam    apixaban  (ELIQUIS ) 5 MG TABS tablet Take 1 tablet (5 mg total) by mouth 2 (two) times daily.   Continuous Glucose Sensor (DEXCOM G7 SENSOR) MISC 1 each by Other route as directed.   CREON  24000-76000 units CPEP Take 1 capsule (24,000 Units total) by mouth 3 (three) times daily with meals as needed (to aid food digestion- as determined by the patient).   famotidine  (PEPCID ) 40 MG tablet Take 40 mg by mouth 2 (two) times daily before a meal.   glucose blood (ONETOUCH VERIO) test strip 1 each by Other route daily in the afternoon. Use as instructed   Insulin  Aspart FlexPen (NOVOLOG ) 100 UNIT/ML Max daily 30 units   insulin  glargine (LANTUS  SOLOSTAR) 100 UNIT/ML Solostar Pen Inject 8 Units into the skin daily.   Insulin  Pen Needle 32G X 4 MM MISC 1 Device by Does not apply route in the morning, at noon, in the evening, and at bedtime.   lidocaine -prilocaine  (EMLA ) cream  Apply 1 Application topically as needed (for port access).   metoCLOPramide  (REGLAN ) 5 MG tablet Take 1 tablet (5 mg total) by mouth every 8 (eight) hours as needed  for nausea.   pantoprazole  (PROTONIX ) 40 MG tablet Take 1 tablet (40 mg total) by mouth daily.   polyethylene glycol (MIRALAX  / GLYCOLAX ) 17 g packet Take 17 g by mouth daily as needed.   TYLENOL  500 MG tablet Take 250 mg by mouth every 6 (six) hours as needed for mild pain (pain score 1-3) or headache.   LORazepam  (ATIVAN ) 0.5 MG tablet Place 1 tablet (0.5 mg total) under the tongue every 6 (six) hours as needed (nausea). (Patient not taking: Reported on 06/26/2024)   magic mouthwash SOLN Take 5 mLs by mouth 4 (four) times daily as needed for mouth pain. Suspension contains equal amounts of Maalox Extra Strength, nystatin, and diphenhydramine . (Patient not taking: Reported on 06/26/2024)   neomycin-polymyxin-dexameth (MAXITROL) 0.1 % OINT Place 1 Application into the left eye 2 (two) times daily as needed (for irritation). (Patient not taking: Reported on 06/26/2024)   ondansetron  (ZOFRAN ) 8 MG tablet Take 1 tablet (8 mg total) by mouth every 8 (eight) hours as needed for nausea or vomiting. (Patient not taking: Reported on 06/26/2024)   oxyCODONE  (OXY IR/ROXICODONE ) 5 MG immediate release tablet Take 0.5-1 tablets (2.5-5 mg total) by mouth every 4 (four) hours as needed (for pain). (Patient not taking: Reported on 06/26/2024)   promethazine  (PHENERGAN ) 12.5 MG tablet Take 12.5 mg by mouth every 6 (six) hours as needed for nausea or vomiting. (Patient not taking: Reported on 06/26/2024)   No facility-administered encounter medications on file as of 06/26/2024.     Physical Exam: Today's Vitals   06/26/24 1500  BP: 108/65  Pulse: 80  Temp: 98.4 F (36.9 C)  TempSrc: Oral  SpO2: 94%  Weight: 119 lb (54 kg)  Height: 5' 5 (1.651 m)   Body mass index is 19.8 kg/m.  Physical Exam GEN: No acute distress. CV: Regular rate and rhythm, no murmurs. LUNGS: Clear to auscultation bilaterally, normal respiratory effort. SKIN JOINTS: Warm and dry, no rash.   Data Reviewed: Imaging: CT chest 05/26/2024 [facility] Consolidative opacities in lower lobes, likely aspiration pneumonia, pulm edema with small pleural effusion.  Decreased pulmonary embolism since 04/02/2024.  CT chest 06/07/2024 Partially recanalized old pulmonary embolism in the right.  Small peripheral right lower lobe and middle lobe rounded opacities.  Resolved effusions and pulmonary edema.    PFTs:  Labs:  Assessment & Plan Pulmonary embolism with pulmonary infarct, on long-term anticoagulation Multiple small pulmonary emboli with associated pulmonary infarct. Currently on Eliquis  for anticoagulation. The emboli are improving, but small remnants may persist. Long-term anticoagulation is recommended due to the unprovoked nature of the clot, with a high risk of recurrence if discontinued. Discussed risks of long-term anticoagulation, including increased bleeding risk, and benefits of preventing further embolic events. Shared decision-making emphasized the importance of continuing anticoagulation to prevent life-threatening embolic events. - Continue Eliquis  indefinitely for anticoagulation. - Will discuss with Dr. Cloretta regarding long-term anticoagulation strategy. - Advised on fall prevention to minimize bleeding risk.  Resolving pneumonia Bilateral pneumonia with extensive opacities in the lower lobes, likely secondary to recent hospitalization and surgery.  On CT compared to imaging from [facility] the pneumonia appears to be resolving, with most of the lung clear on recent imaging. Differential includes resolving pneumonia versus infarct from pulmonary embolism. - Will review repeat CT chest in 3-4  months to assess resolution of lung opacities.  She is due to get it done at her oncologist at [facility]  Deconditioning following major surgery and hospitalization Significant deconditioning due to recent major surgery and hospitalization. Symptoms include shortness of breath and fatigue, likely due to muscle loss and  deconditioning. No balance or gait issues reported, so physical therapy is not indicated. - Encouraged gradual increase in physical activity, starting with walking and light strength training. - Advised use of stationary bike or treadmill for exercise. - Recommended clearance from surgeon before starting weightlifting.  Recommendations: Start exercise program Follow-up CT  Lonna Coder MD [facility] 06/26/2024, 3:09 PM  CC: Tisovec, Charlie ORN, MD   "

## 2024-06-26 NOTE — Telephone Encounter (Addendum)
 Images from 05/26/2025 have been shared via [employer].

## 2024-06-26 NOTE — Patient Instructions (Signed)
" °  VISIT SUMMARY: You visited us  today due to ongoing shortness of breath and fatigue following your recent hospitalization and surgery. We discussed your pulmonary embolism, pneumonia, and deconditioning after surgery. We have outlined a plan to manage these issues and improve your overall health.  YOUR PLAN: PULMONARY EMBOLISM WITH PULMONARY INFARCT: You have multiple small blood clots in your lungs, which are improving but may still have some remnants. You are on Eliquis  to prevent further clots. -Continue taking Eliquis  indefinitely to prevent further blood clots. -We will discuss your long-term anticoagulation strategy with Dr. Cloretta. -Be cautious to prevent falls to minimize bleeding risk.  RESOLVING PNEUMONIA: You had pneumonia in both lungs, likely due to your recent surgery. It is getting better, but we need to monitor it. -We will review your follow up CT scan of your chest in 3-4 months to check if the pneumonia has fully resolved.  DECONDITIONING FOLLOWING MAJOR SURGERY AND HOSPITALIZATION: You are experiencing shortness of breath and fatigue due to muscle loss and deconditioning after your surgery. -Gradually increase your physical activity, starting with walking and light strength training. -Use a stationary bike or treadmill for exercise. -Get clearance from your surgeon before starting weightlifting. "

## 2024-06-27 ENCOUNTER — Encounter: Payer: Self-pay | Admitting: Pulmonary Disease

## 2024-06-27 ENCOUNTER — Telehealth: Payer: Self-pay | Admitting: Nurse Practitioner

## 2024-06-29 ENCOUNTER — Encounter: Attending: Internal Medicine | Admitting: Dietician

## 2024-06-29 ENCOUNTER — Encounter: Payer: Self-pay | Admitting: Dietician

## 2024-06-29 ENCOUNTER — Other Ambulatory Visit: Payer: Self-pay | Admitting: *Deleted

## 2024-06-29 ENCOUNTER — Inpatient Hospital Stay: Admitting: Oncology

## 2024-06-29 ENCOUNTER — Other Ambulatory Visit: Payer: Self-pay | Admitting: Internal Medicine

## 2024-06-29 ENCOUNTER — Telehealth: Payer: Self-pay

## 2024-06-29 DIAGNOSIS — E891 Postprocedural hypoinsulinemia: Secondary | ICD-10-CM | POA: Insufficient documentation

## 2024-06-29 DIAGNOSIS — C259 Malignant neoplasm of pancreas, unspecified: Secondary | ICD-10-CM

## 2024-06-29 DIAGNOSIS — E139 Other specified diabetes mellitus without complications: Secondary | ICD-10-CM | POA: Insufficient documentation

## 2024-06-29 DIAGNOSIS — Z90410 Acquired total absence of pancreas: Secondary | ICD-10-CM | POA: Insufficient documentation

## 2024-06-29 MED ORDER — ACCU-CHEK SOFTCLIX LANCETS MISC: 1.0000 | Freq: Four times a day (QID) | 3 refills | Status: AC | PRN

## 2024-06-29 MED ORDER — ACCU-CHEK GUIDE W/DEVICE KIT: 1.0000 | PACK | Freq: Four times a day (QID) | 0 refills | Status: AC

## 2024-06-29 MED ORDER — ACCU-CHEK GUIDE TEST VI STRP: ORAL_STRIP | 12 refills | Status: DC

## 2024-06-29 NOTE — Patient Instructions (Signed)
 Cal[employer] app Consider getting ketone strips. Bring back up supplies when traveling.

## 2024-06-29 NOTE — Telephone Encounter (Signed)
 Accuchek sent to pharmacy on file

## 2024-06-29 NOTE — Telephone Encounter (Signed)
-----   Message from Knox Leita Ruth sent at 06/29/2024 11:48 AM EST ----- Regarding: prescription request Patient would like a prescription for the Accu Chek Guide me meter, strips and lancets for the softclix for AccuChek as well.  She has the One Touch Strips and may buy a one touch meter (lost hers) but wishes to keep that prescription in the system at this time.  Thanks, Leita

## 2024-06-29 NOTE — Progress Notes (Signed)
 Dr. Cloretta has agreed to see Alyssa Harper before she goes to [place]  on 07/03/24 at 0920 with lab/flush prior. Scheduler notified.

## 2024-06-29 NOTE — Progress Notes (Signed)
 "   Diabetes Self-Management Education  Visit Type: Follow-up  Appt. Start Time: 1130 Appt. End Time: 1215  06/29/2024  Ms. Alyssa Harper, identified by name and date of birth, is a 64 y.o. female with a diagnosis of Diabetes: Type 1.   ASSESSMENT Patient is here today with her husband.  She was last seen on 04/05/2024 and will have Type 3 diabetes after removal of pancrease.  Blood glucose test kit provided on last visit.  She has misplaced the meter and would like to use the Accu Chek meter.  These prescriptions for meter, strips, and lancets were added by CMA.  She had pancrease removed 05/14/2024.  She has seen endocrinologist twice since that time.   She states that she is quickly going from 250 to 70 quickly.  She gets nausea at times when it is high.   Increased fatigue, night sweets, increased thirst, waking every 2 hours.  Feels hypoglycemia below 80.   We discussed options of ketone testing.  Discussed traveling with diabetes, Renna case option for storage when traveling and that there are personalized bags designed for diabetes.  Showed her the tandem pump options again (t:slim and Mobi).  Patient likes a tubeless option and discussed that this company is to introduce a tubeless option hopefully this year.   She states her carbohydrate counting is a B+.  Provided bother her and her husband with the yellow carb counting card.  Showed them the calorie king app which can help particularly for eating out. Plans to travel to [place]  for 3 months and is leaving next week. She is struggling at times to cope with the diabetes.  She is interested in the Type 1 Diabetes support group and have added her to this list.  Discussed resources such as [employer] and [employer] (she is eligible for a new intro kit from them due to new diagnosis).  Discussed new normal. Weight is down 2 lbs since 04/05/2024.  Discussed eating as desired with meals, counting carbs and dosing insulin  appropriately.  Choose  low carb snacks when hungry between meals. Avoid giving Novolog  more frequently than every 3-4 hours to avoid insulin  stacking.   History includes:  Type 3 diabetes, Pancreatic cancer 2020 and tail of the pancrease has been removed.  Cancer has returned.  s/p Chemo. Total pancreatectomy and whipple (05/14/2024 at [facility]). Other history includes Sjogren's disease, HLD, MVP, PE (10/25) She complains of a headache that has lasted several days. She states that she does not drink enough water as it makes her stomach hurt and is unappetizingly.  Discussed to discuss her headache with her MD.   She needs to have her vitamin A, D, E, K, and vitamin B-12 monitored post op.   Medications include:  Lantus  8 units q am, Novolog  before meals 3-4 units (1:14 ICR, ISF 40 correct above 120), Creon  before meals but not snacks, Eliquis  Labs: Glucose 104 on 03/26/2024   CGM:  Dexcom G7  CGM Results from download: 06/30/2023  % Time CGM active:   96 %   (Goal >70%)  Average glucose:   204 mg/dL for 14 days  Glucose management indicator:   8.1 %  Time in range (70-180 mg/dL):   33 %   (Goal >29%)  Time High (181-250 mg/dL):   55 %   (Goal < 74%)  Time Very High (>250 mg/dL):    12 %   (Goal < 5%)  Time Low (54-69 mg/dL):   0 %   (  Goal <4%)  Time Very Low (<54 mg/dL):   <1 %   (Goal <8%)  %CV (glucose variability)    22.9 %  (Goal <36%)   64 120 lbs 06/30/2023 122 lbs 03/26/2024 120 UBW   Patient lives with her husband.  She does the shopping and cooking.  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 Her husband does not like much meat but she has been eating more meat herself. Likes:  Cheerios, blueberries, 2% milk, OJ, hot black tea with sweetened creamer for breakfast and snacks usually include:  crackers and PB OR nuts OR yogurt and fruit    Diabetes Self-Management Education - 06/29/24 1429       Visit Information   Visit Type Follow-up      Initial Visit   Diabetes Type Type 1    Are you  currently following a meal plan? Yes    Are you taking your medications as prescribed? Yes      Health Coping   How would you rate your overall health? Good      Psychosocial Assessment   Patient Belief/Attitude about Diabetes Motivated to manage diabetes    What is the hardest part about your diabetes right now, causing you the most concern, or is the most worrisome to you about your diabetes?   Getting support / problem solving    Self-care barriers None    Self-management support Doctor's office;CDE visits    Other persons present Patient;Spouse/SO    Patient Concerns Nutrition/Meal planning    Special Needs None    Preferred Learning Style No preference indicated    Learning Readiness Ready    How often do you need to have someone help you when you read instructions, pamphlets, or other written materials from your doctor or pharmacy? 1 - Never      Pre-Education Assessment   Patient understands the diabetes disease and treatment process. Needs Review    Patient understands incorporating nutritional management into lifestyle. Needs Review    Patient undertands incorporating physical activity into lifestyle. Comprehends key points    Patient understands using medications safely. Comprehends key points    Patient understands monitoring blood glucose, interpreting and using results Comprehends key points    Patient understands prevention, detection, and treatment of acute complications. Needs Review    Patient understands prevention, detection, and treatment of chronic complications. Compreheands key points    Patient understands how to develop strategies to address psychosocial issues. Needs Review    Patient understands how to develop strategies to promote health/change behavior. Needs Review      Complications   How often do you check your blood sugar? > 4 times/day    Fasting Blood glucose range (mg/dL) 869-820;819-799    Postprandial Blood glucose range (mg/dL) 819-799;>799     Number of hypoglycemic episodes per month 1    Can you tell when your blood sugar is low? Yes    What do you do if your blood sugar is low? eat bugles    Number of hyperglycemic episodes ( >200mg /dL): Daily    Can you tell when your blood sugar is high? Yes      Patient Education   Healthy Eating Carbohydrate counting    Medications Reviewed patients medication for diabetes, action, purpose, timing of dose and side effects.    Monitoring Taught/evaluated CGM (comment)    Acute complications Trained/discussed glucagon administration to patient and designated other.;Discussed and identified patients' prevention, symptoms, and treatment of hyperglycemia.  Diabetes Stress and Support Identified and addressed patients feelings and concerns about diabetes;Worked with patient to identify barriers to care and solutions      Individualized Goals (developed by patient)   Nutrition Carb counting    Medications take my medication as prescribed    Monitoring  Consistenly use CGM    Problem Solving Eating Pattern    Reducing Risk examine blood glucose patterns;check ketones if blood glucose over 240mg /dL;do foot checks daily;treat hypoglycemia with 15 grams of carbs if blood glucose less than 70mg /dL      Patient Self-Evaluation of Goals - Patient rates self as meeting previously set goals (% of time)   Nutrition >75% (most of the time)    Physical Activity 25 - 50% (sometimes)    Medications >75% (most of the time)    Monitoring >75% (most of the time)    Problem Solving and behavior change strategies  25 - 50% (sometimes)    Reducing Risk (treating acute and chronic complications) >75% (most of the time)    Health Coping 50 - 75 % (half of the time)      Post-Education Assessment   Patient understands the diabetes disease and treatment process. Demonstrates understanding / competency    Patient understands incorporating nutritional management into lifestyle. Needs Review    Patient undertands  incorporating physical activity into lifestyle. Demonstrates understanding / competency    Patient understands using medications safely. Demonstrates understanding / competency    Patient understands monitoring blood glucose, interpreting and using results Demonstrates understanding / competency    Patient understands prevention, detection, and treatment of acute complications. Demonstrates understanding / competency    Patient understands prevention, detection, and treatment of chronic complications. Demonstrates understanding / competency    Patient understands how to develop strategies to address psychosocial issues. Demonstrates understanding / competency    Patient understands how to develop strategies to promote health/change behavior. Demonstrates understanding / competency      Outcomes   Expected Outcomes Demonstrated interest in learning. Expect positive outcomes    Future DMSE PRN    Program Status Completed      Subsequent Visit   Since your last visit have you continued or begun to take your medications as prescribed? Yes    Since your last visit have you experienced any weight changes? Loss    Weight Loss (lbs) 2          Individualized Plan for Diabetes Self-Management Training:   Learning Objective:  Patient will have a greater understanding of diabetes self-management. Patient education plan is to attend individual and/or group sessions per assessed needs and concerns.   Plan:   Patient Instructions  Calorie Myrna app Consider getting ketone strips. Bring back up supplies when traveling.  Expected Outcomes:  Demonstrated interest in learning. Expect positive outcomes  Education material provided: Meal plan card and Diabetes Resources  If problems or questions, patient to contact team via:  Phone  Future DSME appointment: PRN "

## 2024-07-02 ENCOUNTER — Ambulatory Visit: Admitting: Internal Medicine

## 2024-07-02 ENCOUNTER — Telehealth: Payer: Self-pay | Admitting: Oncology

## 2024-07-02 ENCOUNTER — Encounter: Payer: Self-pay | Admitting: Internal Medicine

## 2024-07-02 VITALS — BP 104/60 | Ht 65.0 in | Wt 119.0 lb

## 2024-07-02 DIAGNOSIS — E139 Other specified diabetes mellitus without complications: Secondary | ICD-10-CM | POA: Diagnosis not present

## 2024-07-02 DIAGNOSIS — E891 Postprocedural hypoinsulinemia: Secondary | ICD-10-CM | POA: Diagnosis not present

## 2024-07-02 DIAGNOSIS — Z90410 Acquired total absence of pancreas: Secondary | ICD-10-CM | POA: Diagnosis not present

## 2024-07-02 NOTE — Patient Instructions (Addendum)
 Lantus  8 units daily  Novolog  insulin  to carb ratio 1:14 with each other meal  Novolog  correctional insulin : ADD extra units on insulin  to your meal-time Novolog  dose if your blood sugars are higher than 160. Use the scale below to help guide you:   Blood sugar before meal Number of units to inject  Less than 175 0 unit  176- 220 1 units  221- 265 2 units  266 - 310 3 units   Check out insulin  pump to include Tandem, Mobi, iLet pump   HOW TO TREAT LOW BLOOD SUGARS (Blood sugar LESS THAN 70 MG/DL) Please follow the RULE OF 15 for the treatment of hypoglycemia treatment (when your (blood sugars are less than 70 mg/dL)   STEP 1: Take 15 grams of carbohydrates when your blood sugar is low, which includes:  3-4 GLUCOSE TABS  OR 3-4 OZ OF JUICE OR REGULAR SODA OR ONE TUBE OF GLUCOSE GEL    STEP 2: RECHECK blood sugar in 15 MINUTES STEP 3: If your blood sugar is still low at the 15 minute recheck --> then, go back to STEP 1 and treat AGAIN with another 15 grams of carbohydrates.

## 2024-07-02 NOTE — Telephone Encounter (Signed)
 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 Needs afternoon time due to being in court all day

## 2024-07-02 NOTE — Progress Notes (Unsigned)
 " Name: Ariba Lehnen  MRN/ DOB: [id number], [birth year]   Age/ Sex: 64 y.o., female    PCP: Tisovec, Charlie ORN, MD   Reason for Endocrinology Evaluation: Pancreatic adenocarcinoma     Date of Initial Endocrinology Visit: 03/20/2024    PATIENT IDENTIFIER: Ms. Donnajean Chesnut is a 64 y.o. female with a past medical history of pancreatic adenocarcinoma. The patient presented for initial endocrinology clinic visit on 03/20/2024 for consultative assistance with her diabetes management.    HPI: Ms. Barbier was referred here by oncology   The patient was diagnosed with pancreatic adenocarcinoma, she has failed s/p distal pancreatectomy+ splenectomy in 2020 at [facility].  She did receive chemotherapy through September 2020.  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  She underwent palliative open gastrojejunostomy in May, 2025 which demonstrated a firm mass involving the pancreas and the duodenal bulb, with biopsy significant for moderately differentiated adenocarcinoma.  She completed 4 chemo cycles from June to July, 2025   She underwent Whipple procedure on 05/10/2024 at [facility] in [place] .  And was started on basal/prandial insulin    SUBJECTIVE:   During the last visit (06/22/2024): A1c 6.9%   Today (07/02/2024): Waylon Nance is here for follow-up on diabetes management.  She checks blood sugars multiple  times daily through CGM. The patient has had hypoglycemic episodes since the last clinic visit.  She is symptomatic with these episodes   Since her last visit here she had a follow-up with pulmonary for pulmonary embolism, on Eliquis  indefinitely at this point Recent pneumonia is resolving versus infarct  Patient has been noted with slight weight gain SOB is minimally improved  No nausea  No recent abdominal pain  She has been tired, she is not sleeping well at night   HOME  DIABETES REGIMEN:  Lantus  8 units daily NovoLog  1:14 TID CF: Novolog  (BG-120/40)  Prior Diabetic Education: yes   CONTINUOUS GLUCOSE MONITORING RECORD INTERPRETATION                        DIABETIC COMPLICATIONS: Microvascular complications:   Denies:  Last Eye Exam: Completed   Macrovascular complications:   Denies: CAD, CVA, PVD    PAST HISTORY: Past Medical History:  Past Medical History:  Diagnosis Date   Family history of adverse reaction to anesthesia    grandmother stopped breathing   Headache    weekly (07/03/2018)   High cholesterol    Mitral valve prolapse    Pancreatic mass 07/03/2018   Sjogren's disease    Past Surgical History:  Past Surgical History:  Procedure Laterality Date   AUGMENTATION MAMMAPLASTY Bilateral 2013   ESOPHAGOGASTRODUODENOSCOPY N/A 07/05/2018   Procedure: ESOPHAGOGASTRODUODENOSCOPY (EGD);  Surgeon: Burnette Fallow, MD;  Location: [facility];  Service: Endoscopy;  Laterality: N/A;   EUS N/A 07/05/2018   Procedure: UPPER ENDOSCOPIC ULTRASOUND (EUS) LINEAR and Radial;  Surgeon: Burnette Fallow, MD;  Location: [facility];  Service: Endoscopy;  Laterality: N/A;   FINE NEEDLE ASPIRATION N/A 07/05/2018   Procedure: FINE NEEDLE ASPIRATION (FNA) LINEAR;  Surgeon: Burnette Fallow, MD;  Location: [facility];  Service: Endoscopy;  Laterality: N/A;    Social History:  reports that she has never smoked. She has never used smokeless tobacco. She reports current alcohol use of about 7.0 standard drinks of alcohol per week. She reports that she does not use drugs. Family History: No family history  on file.   HOME MEDICATIONS: Allergies as of 07/02/2024       Reactions   Tape Dermatitis, Other (See Comments)   IV3000 DRESSING FOR PORT ACCESS - Tegaderm cause irritation to the surrounding skin   Wound Dressing Adhesive Dermatitis, Other (See Comments)   IV3000 DRESSING FOR PORT ACCESS - Tegaderm cause irritation to the  surrounding skin   Pollen Extract Other (See Comments)   Hayfever   Penicillins Rash        Medication List        Accurate as of July 02, 2024  2:20 PM. If you have any questions, ask your nurse or doctor.          Accu-Chek Guide w/Device Kit 1 Device by Does not apply route 4 (four) times daily.   Accu-Chek Softclix Lancets lancets 1 each by Other route 4 (four) times daily as needed for other.   ALPRAZolam  0.25 MG tablet Commonly known as: XANAX  Take 1 tablet (0.25 mg total) by mouth 2 (two) times daily as needed for anxiety. Do not take if taking lorazepam    apixaban  5 MG Tabs tablet Commonly known as: ELIQUIS  Take 1 tablet (5 mg total) by mouth 2 (two) times daily.   Creon  24000-76000 units Cpep Generic drug: Pancrelipase  (Lip-Prot-Amyl) Take 1 capsule (24,000 Units total) by mouth 3 (three) times daily with meals as needed (to aid food digestion- as determined by the patient).   Dexcom G7 Sensor Misc 1 each by Other route as directed.   famotidine  40 MG tablet Commonly known as: PEPCID  Take 40 mg by mouth 2 (two) times daily before a meal.   Insulin  Aspart FlexPen 100 UNIT/ML Commonly known as: NOVOLOG  Max daily 30 units   Insulin  Pen Needle 32G X 4 MM Misc 1 Device by Does not apply route in the morning, at noon, in the evening, and at bedtime.   Lantus  SoloStar 100 UNIT/ML Solostar Pen Generic drug: insulin  glargine Inject 8 Units into the skin daily.   lidocaine -prilocaine  cream Commonly known as: EMLA  Apply 1 Application topically as needed (for port access).   LORazepam  0.5 MG tablet Commonly known as: ATIVAN  Place 1 tablet (0.5 mg total) under the tongue every 6 (six) hours as needed (nausea).   magic mouthwash Soln Take 5 mLs by mouth 4 (four) times daily as needed for mouth pain. Suspension contains equal amounts of Maalox Extra Strength, nystatin, and diphenhydramine .   metoCLOPramide  5 MG tablet Commonly known as: Reglan  Take 1  tablet (5 mg total) by mouth every 8 (eight) hours as needed for nausea.   neomycin-polymyxin-dexameth 0.1 % Oint Commonly known as: MAXITROL Place 1 Application into the left eye 2 (two) times daily as needed (for irritation).   ondansetron  8 MG tablet Commonly known as: ZOFRAN  Take 1 tablet (8 mg total) by mouth every 8 (eight) hours as needed for nausea or vomiting.   OneTouch Verio test strip Generic drug: glucose blood 1 each by Other route daily in the afternoon. Use as instructed   Accu-Chek Guide Test test strip Generic drug: glucose blood USE AS INSTRUCTED   oxyCODONE  5 MG immediate release tablet Commonly known as: Oxy IR/ROXICODONE  Take 0.5-1 tablets (2.5-5 mg total) by mouth every 4 (four) hours as needed (for pain).   pantoprazole  40 MG tablet Commonly known as: PROTONIX  Take 1 tablet (40 mg total) by mouth daily.   polyethylene glycol 17 g packet Commonly known as: MIRALAX  / GLYCOLAX  Take 17 g by mouth daily as  needed.   promethazine  12.5 MG tablet Commonly known as: PHENERGAN  Take 12.5 mg by mouth every 6 (six) hours as needed for nausea or vomiting.   TYLENOL  500 MG tablet Generic drug: acetaminophen  Take 250 mg by mouth every 6 (six) hours as needed for mild pain (pain score 1-3) or headache.         ALLERGIES: Allergies  Allergen Reactions   Tape Dermatitis and Other (See Comments)    IV3000 DRESSING FOR PORT ACCESS - Tegaderm cause irritation to the surrounding skin   Wound Dressing Adhesive Dermatitis and Other (See Comments)    IV3000 DRESSING FOR PORT ACCESS - Tegaderm cause irritation to the surrounding skin   Pollen Extract Other (See Comments)    Hayfever   Penicillins Rash     REVIEW OF SYSTEMS: A comprehensive ROS was conducted with the patient and is negative except as per HPI     OBJECTIVE:   VITAL SIGNS: BP 104/60   Ht 5' 5 (1.651 m)   Wt 119 lb (54 kg)   BMI 19.80 kg/m    PHYSICAL EXAM:  General: Pt appears well  and is in NAD  Neck: Thyroid : Thyroid  size normal.  No goiter or nodules appreciated.   Lungs: Clear with good BS bilat   Heart: RRR   Abdomen:  soft, nontender  Extremities:  Lower extremities - No pretibial edema.   Neuro: MS is good with appropriate affect, pt is alert and Ox3   Dm Foot Exam 06/26/2023  The skin of the feet is intact without sores or ulcerations. The pedal pulses are 2+ on right and 2+ on left. The sensation is intact to a screening 5.07, 10 gram monofilament bilaterally   DATA REVIEWED:  Lab Results  Component Value Date   HGBA1C 6.9 (A) 06/22/2024   HGBA1C 5.3 07/04/2018    Latest Reference Range & Units 03/12/24 07:51  Sodium 135 - 145 mmol/L 140  Potassium 3.5 - 5.1 mmol/L 4.7  Chloride 98 - 111 mmol/L 104  CO2 22 - 32 mmol/L 27  Glucose 70 - 99 mg/dL 865 (H)  BUN 8 - 23 mg/dL 11  Creatinine 9.55 - 8.99 mg/dL 9.28  Calcium  8.9 - 10.3 mg/dL 9.5  Anion gap 5 - 15  10  Alkaline Phosphatase 38 - 126 U/L 190 (H)  Albumin 3.5 - 5.0 g/dL 4.2  AST 15 - 41 U/L 32  ALT 0 - 44 U/L 27  Total Protein 6.5 - 8.1 g/dL 6.7  Total Bilirubin 0.0 - 1.2 mg/dL 0.3  GFR, Est Non African American >60 mL/min >60      ASSESSMENT / PLAN / RECOMMENDATIONS:   1) Type 1 Diabetes Mellitus, Optimally controlled, With out complications - Most recent A1c of 6.9 %. Goal A1c < 7.0 %.    -This is a new diagnosis following pancreatectomy - She has been eating 3 meals a day as well as in between the meals for nutritional purposes.  We did discuss the importance of taking NovoLog  before each meal, I did encourage the patient to try to separate NovoLog  intake by 3-4 hours to prevent insulin  stacking which can lead to hypoglycemia - I have also recommended trying to avoid CHO intake during the snack/in between meal eatings to avoid hyperglycemia and the need for NovoLog  - I will also change her sensitivity factor as she has been noted with hypoglycemia with correction - I will also  increase her basal insulin  as below - We discussed insulin  pump  technology including tandem t:slim, tandem MOBI, as well as the bionic islet pump - She is going to [place]  in the next few weeks and will return in March.  Will postpone the process of starting until her return in March  MEDICATIONS: Increase Lantus  8 units daily Change NovoLog  I:C ratio of 1:14 Novolog  : BG -120/40  EDUCATION / INSTRUCTIONS: BG monitoring instructions: Patient is instructed to check her blood sugars 3 times a day, before meals. Call [facility] if: BG persistently < 70  I reviewed the Rule of 15 for the treatment of hypoglycemia in detail with the patient. Literature supplied.    2) Diabetic complications:  Eye: Does not have known diabetic retinopathy.  Neuro/ Feet: Does not have known diabetic peripheral neuropathy .  Renal: Patient does not have known baseline CKD. She  is not on an ACEI/ARB at present.   Follow-up July 02, 2024  Signed electronically by: Stefano Redgie Butts, MD  [facility]  [facility] [address] [place] [place], [place] [address] Phone: [phone] FAX: [phone]   CC: Vernadine Charlie ORN, MD [address] [place] [address] Phone: [phone]  Fax: [phone]    Return to Endocrinology clinic as below: Future Appointments  Date Time Provider Department Center  07/03/2024  2:30 PM [facility] None  10/24/2024 10:00 AM Mannam, Praveen, MD [facility] [address]     "

## 2024-07-03 ENCOUNTER — Inpatient Hospital Stay

## 2024-07-03 ENCOUNTER — Telehealth: Payer: Self-pay | Admitting: Internal Medicine

## 2024-07-03 ENCOUNTER — Telehealth: Payer: Self-pay | Admitting: Dietician

## 2024-07-03 ENCOUNTER — Inpatient Hospital Stay: Attending: Hematology and Oncology

## 2024-07-03 ENCOUNTER — Inpatient Hospital Stay: Admitting: Oncology

## 2024-07-03 ENCOUNTER — Other Ambulatory Visit: Payer: Self-pay | Admitting: Nurse Practitioner

## 2024-07-03 DIAGNOSIS — C259 Malignant neoplasm of pancreas, unspecified: Secondary | ICD-10-CM

## 2024-07-03 LAB — CBC WITH DIFFERENTIAL (CANCER CENTER ONLY)
Abs Immature Granulocytes: 0.02 K/uL (ref 0.00–0.07)
Basophils Absolute: 0.1 K/uL (ref 0.0–0.1)
Basophils Relative: 1 %
Eosinophils Absolute: 0.2 K/uL (ref 0.0–0.5)
Eosinophils Relative: 3 %
HCT: 33.6 % — ABNORMAL LOW (ref 36.0–46.0)
Hemoglobin: 10.5 g/dL — ABNORMAL LOW (ref 12.0–15.0)
Immature Granulocytes: 0 %
Lymphocytes Relative: 22 %
Lymphs Abs: 1.2 K/uL (ref 0.7–4.0)
MCH: 29.6 pg (ref 26.0–34.0)
MCHC: 31.3 g/dL (ref 30.0–36.0)
MCV: 94.6 fL (ref 80.0–100.0)
Monocytes Absolute: 1 K/uL (ref 0.1–1.0)
Monocytes Relative: 18 %
Neutro Abs: 3.1 K/uL (ref 1.7–7.7)
Neutrophils Relative %: 56 %
Platelet Count: 541 K/uL — ABNORMAL HIGH (ref 150–400)
RBC: 3.55 MIL/uL — ABNORMAL LOW (ref 3.87–5.11)
RDW: 16.3 % — ABNORMAL HIGH (ref 11.5–15.5)
WBC Count: 5.5 K/uL (ref 4.0–10.5)
nRBC: 0 % (ref 0.0–0.2)

## 2024-07-03 LAB — CMP (CANCER CENTER ONLY)
ALT: 27 U/L (ref 0–44)
AST: 42 U/L — ABNORMAL HIGH (ref 15–41)
Albumin: 4.2 g/dL (ref 3.5–5.0)
Alkaline Phosphatase: 94 U/L (ref 38–126)
Anion gap: 11 (ref 5–15)
BUN: 14 mg/dL (ref 8–23)
CO2: 27 mmol/L (ref 22–32)
Calcium: 9.3 mg/dL (ref 8.9–10.3)
Chloride: 98 mmol/L (ref 98–111)
Creatinine: 0.61 mg/dL (ref 0.44–1.00)
GFR, Estimated: >60 mL/min
Glucose, Bld: 234 mg/dL — ABNORMAL HIGH (ref 70–99)
Potassium: 4.3 mmol/L (ref 3.5–5.1)
Sodium: 135 mmol/L (ref 135–145)
Total Bilirubin: 0.4 mg/dL (ref 0.0–1.2)
Total Protein: 7.5 g/dL (ref 6.5–8.1)

## 2024-07-03 MED ORDER — ALPRAZOLAM 0.25 MG PO TABS: 0.2500 mg | ORAL_TABLET | Freq: Two times a day (BID) | ORAL | 0 refills | Status: AC | PRN

## 2024-07-03 NOTE — Patient Instructions (Signed)

## 2024-07-03 NOTE — Telephone Encounter (Signed)
 Alyssa Harper,   Alyssa Harper is a patient who developed diabetes following total pancreatectomy due to pancreatic cancer.   I did see her yesterday but I was unable to make any changes because there is no pattern in her hypoglycemia.    She does have fear of hypoglycemia and has been eating at night and would not go to bed unless her BGs are in the higher 100s   During the day she has high glucose readings but on rare occasions her sugar will drop around 3 PM while she is taking a nap.   I did explain to the patient that she probably needs more prandial insulin  but due to the fear of hypoglycemia, and due to the fact that she does have randomly hypoglycemic episodes in the middle of the day I am not comfortable making the change yet but I would like for her to see you, and you review her eating, carbohydrate counting, insulin  intake just like you did with the other patient that I have with type 1 diabetes    I know she is currently in [place] , but I know she will be in town I believe on February 2nd   Can you please see if you can see her that day or any other day that she is available and ask her to write down a diary so you can review this    Thank you

## 2024-07-03 NOTE — Telephone Encounter (Signed)
 Awesome! Thank you!

## 2024-07-03 NOTE — Telephone Encounter (Signed)
 Called patient and made an appointment with her to see me on 07/27/23 when she will be back in town from [place]  for work.  She states that she has had a difficult time controlling her blood glucose.  Took 2 units for 24 grams of carbohydrates last night and dropped to 80 with symptoms of hypoglycemia.  She then rose to over 200 this am after eating 21 grams of carbs.  We discussed timing of bolus, affects of exercise, stress, pain, meal intake, treatment of lows on blood glucose. Patient is to journal time of meal, what she eats and how much, carb amount, bolus amount and time.  This will be addressed at her next visit with me.  Leita Constable, RD, LDN, CDCES, DipACLM

## 2024-07-04 ENCOUNTER — Ambulatory Visit: Payer: Self-pay | Admitting: Oncology

## 2024-07-04 LAB — CANCER ANTIGEN 19-9: CA 19-9: 49 U/mL — ABNORMAL HIGH (ref 0–35)

## 2024-07-04 NOTE — Telephone Encounter (Signed)
-----   Message from Arley Hof, MD sent at 07/04/2024  2:01 PM EST ----- Please call patient, the CA 19-9 is just above the upper end of the normal range, I do not think this is significant given the recent pancreas surgery (inflammation from surgery can cause an  elevation of the CA 19-9 Schneid), call for new symptoms, follow-up as scheduled, repeat CA 19-9 with next Port-A-Cath flush

## 2024-07-20 ENCOUNTER — Other Ambulatory Visit: Payer: Self-pay | Admitting: *Deleted

## 2024-07-20 MED ORDER — CREON 24000-76000 UNITS PO CPEP: 24000.0000 [IU] | ORAL_CAPSULE | Freq: Three times a day (TID) | ORAL | 1 refills | Status: DC | PRN

## 2024-07-20 MED ORDER — CREON 24000-76000 UNITS PO CPEP: 48000.0000 [IU] | ORAL_CAPSULE | Freq: Three times a day (TID) | ORAL | 0 refills | Status: AC | PRN

## 2024-07-20 NOTE — Telephone Encounter (Signed)
 Alyssa Harper reports pharmacy will not fill the Creon  due to too early based on #1 with meals. She has had to take #2 with meals due to increased GI distress and this has been discussed w/MD. Asking for new script be sent with new directions.

## 2024-07-25 ENCOUNTER — Telehealth: Payer: Self-pay | Admitting: Oncology

## 2024-07-25 NOTE — Telephone Encounter (Signed)
 PT called to reschedule appt; day and time confirmed.

## 2024-07-26 ENCOUNTER — Encounter: Admitting: Dietician

## 2024-07-27 ENCOUNTER — Telehealth: Payer: Self-pay | Admitting: Internal Medicine

## 2024-07-27 ENCOUNTER — Inpatient Hospital Stay

## 2024-07-27 MED ORDER — LANTUS SOLOSTAR 100 UNIT/ML ~~LOC~~ SOPN: 6.0000 [IU] | PEN_INJECTOR | Freq: Every day | SUBCUTANEOUS | Status: AC

## 2024-07-27 NOTE — Telephone Encounter (Signed)
 Left message for patient to review mychart message with details or call the office back with any questions.

## 2024-07-27 NOTE — Telephone Encounter (Signed)
 Can you please download Dexcom?   Thank you

## 2024-08-02 ENCOUNTER — Inpatient Hospital Stay

## 2024-08-22 ENCOUNTER — Ambulatory Visit: Admitting: Internal Medicine

## 2024-09-20 ENCOUNTER — Inpatient Hospital Stay

## 2024-09-20 ENCOUNTER — Inpatient Hospital Stay: Admitting: Oncology

## 2024-09-27 ENCOUNTER — Encounter: Admitting: Dietician

## 2024-10-12 ENCOUNTER — Ambulatory Visit: Admitting: Internal Medicine

## 2024-10-24 ENCOUNTER — Ambulatory Visit: Admitting: Pulmonary Disease
